# Patient Record
Sex: Female | Born: 1985 | Race: Black or African American | Hispanic: Yes | Smoking: Current every day smoker
Health system: Southern US, Community
[De-identification: ages and names within clinical notes are randomized; demographics above are authoritative.]

## PROBLEM LIST (undated history)

## (undated) DIAGNOSIS — T8859XA Other complications of anesthesia, initial encounter: Secondary | ICD-10-CM

## (undated) DIAGNOSIS — I10 Essential (primary) hypertension: Secondary | ICD-10-CM

## (undated) DIAGNOSIS — G8929 Other chronic pain: Secondary | ICD-10-CM

## (undated) DIAGNOSIS — Z8739 Personal history of other diseases of the musculoskeletal system and connective tissue: Secondary | ICD-10-CM

## (undated) DIAGNOSIS — Z95810 Presence of automatic (implantable) cardiac defibrillator: Secondary | ICD-10-CM

## (undated) DIAGNOSIS — J189 Pneumonia, unspecified organism: Secondary | ICD-10-CM

## (undated) DIAGNOSIS — T4145XA Adverse effect of unspecified anesthetic, initial encounter: Secondary | ICD-10-CM

## (undated) DIAGNOSIS — M545 Low back pain: Secondary | ICD-10-CM

## (undated) DIAGNOSIS — E119 Type 2 diabetes mellitus without complications: Secondary | ICD-10-CM

## (undated) DIAGNOSIS — I509 Heart failure, unspecified: Secondary | ICD-10-CM

---
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
## 2003-07-21 HISTORY — PX: CARDIAC CATHETERIZATION: SHX172

## 2003-11-18 DIAGNOSIS — M545 Low back pain, unspecified: Secondary | ICD-10-CM

## 2003-11-18 DIAGNOSIS — G8929 Other chronic pain: Secondary | ICD-10-CM

## 2003-11-18 HISTORY — DX: Other chronic pain: G89.29

## 2003-11-18 HISTORY — DX: Low back pain, unspecified: M54.50

## 2004-02-25 NOTE — ED Provider Notes (Signed)
[facility]                      EMERGENCY DEPARTMENT TREATMENT REPORT   NAME:  [name], [name]                  PT. LOCATION:     ER  [id number]   MR #:         BILLING #: [id number]          DOS: 02/25/2004   TIME: 7:24 A   38-32-34   cc:    Jaye Beagle, M.D.   Primary Physician:   Dr. Guy Franco.   CHIEF COMPLAINT:  Chest pain and cough.   HISTORY OF PRESENT ILLNESS:  This 18 year old, African-American female   presents at [phone] complaining of intermittent, left greater than right,   peristernal chest pain, worse with movement and deep inspiration,   associated with cough productive of green sputum.  The patient has had   these pains several times in the past, never saw a doctor for it, and has   taken some Tylenol without much change.  She denies shortness of breath,   exertional pain, dizziness, syncope, calf symptoms, hemoptysis, and   abdominal pain.  The pain is sharp and brief and rated as 9/10 when   present.   REVIEW OF SYSTEMS: CONSTITUTIONAL:  No fever, chills, weight loss.   ENT:  No sore throat or coryza.   HEMATOLOGIC/LYMPHATIC:  No excessive bruising or lymph node swelling.   RESPIRATORY:  No shortness of breath or wheezing.   CARDIOVASCULAR:  No chest pressure or palpitations.   GASTROINTESTINAL:  No vomiting, diarrhea, or abdominal pain. GENITOURINARY:   No dysuria, frequency, or urgency.  Denies chance of pregnancy.   MUSCULOSKELETAL:  No joint pain or swelling.   INTEGUMENTARY:  No rashes.   PAST MEDICAL HISTORY:  Ovarian cyst.  Negative MI, diabetes, hypertension.   FAMILY HISTORY:  Negative early MI.   SOCIAL HISTORY:  No cigarettes.   ALLERGIES:  None.   MEDICATIONS:  None.   PHYSICAL EXAMINATION:   VITAL SIGNS:  Blood pressure 143/81, pulse 88, respirations 16, temperature   99.3.  O2 saturation is 99% on room air.   GENERAL APPEARANCE:  The patient appears well developed and well nourished.    Appearance and behavior are age and situation appropriate.  The patient   appears in no distress.  Mouth/Throat:  Surfaces of the pharynx, palate,   and tongue are pink, moist, and without lesions.   NECK:  Supple, nontender, symmetrical, no masses or JVD, trachea midline,   thyroid not enlarged, nodular, or tender.   RESPIRATORY:  Clear and equal breath sounds.  No respiratory distress,   tachypnea, or accessory muscle use.   CARDIOVASCULAR:  Heart regular, without murmurs, gallops, rubs, or thrills.   PMI not displaced.   Vascular:   Calves soft and nontender.  No peripheral edema or significant   varicosities. Carotid, femoral, and pedal pulses are satisfactory.  The   abdominal aorta is not palpably enlarged.   CHEST:  Marked tenderness bilateral costochondral junctions, left greater   than right, which reproduces the patient's pain.  No rash or erythema.  No   swelling or crepitus.  GI:  Abdomen soft, nontender, without complaint of   pain to palpation.  No hepatomegaly or splenomegaly.  No abdominal or   inguinal masses appreciated by inspection or palpation.   SKIN:  Warm and dry  without rashes.   PSYCHIATRIC:  Judgment appears appropriate. Recent and remote memory appear   to be intact.  Oriented to time, place and person.  Mood and affect   appropriate.   INITIAL ASSESSMENT AND MANAGEMENT PLAN:  I suspect the patient has   costochondritis, but with her productive cough, we will need to do a chest   x-ray.   CONTINUATION BY DR. MANOLIO:   DIAGNOSTIC EVALUATION:  Chest x-ray was read negative acute by myself.   COURSE IN THE EMERGENCY DEPARTMENT:  The patient is discharged on Naprosyn   and is instructed to get Robitussin-DM over the counter for her cough and   congestion.   FINAL DIAGNOSES:      1. Acute precordial pain - costochondritis.      2. Acute bronchitis.   DISPOSITION:  The patient is discharged home in stable condition, with    instructions to follow up with their regular doctor.  They are advised to   return immediately for any worsening or symptoms of concern.   Electronically Signed By:   Imogene Burn, M.D. 03/02/2004 19:31   ____________________________   Imogene Burn, M.D.   mw/sp  D:  02/25/2004  T:  02/26/2004  7:59 A   [id number]

## 2004-07-28 ENCOUNTER — Emergency Department (HOSPITAL_COMMUNITY): Admission: EM | Admit: 2004-07-28 | Discharge: 2004-07-28 | Payer: Self-pay | Admitting: Emergency Medicine

## 2004-07-29 ENCOUNTER — Emergency Department (HOSPITAL_COMMUNITY): Admission: EM | Admit: 2004-07-29 | Discharge: 2004-07-29 | Payer: Self-pay

## 2004-08-01 ENCOUNTER — Inpatient Hospital Stay (HOSPITAL_COMMUNITY): Admission: EM | Admit: 2004-08-01 | Discharge: 2004-08-02 | Payer: Self-pay | Admitting: Emergency Medicine

## 2004-08-03 NOTE — ED Provider Notes (Signed)
[facility]                      EMERGENCY DEPARTMENT TREATMENT REPORT   NAME:  [name], KOTARA                    PT. LOCATION:     ER  [id number]   MR #:         BILLING #: [id number]          DOS: 08/03/2004   TIME: 1:25 A   38-32-34   cc:    [name], M.D.   Primary Physician:   Time seen:  0100.   CHIEF COMPLAINT:  Chest pain.   HISTORY OF PRESENT ILLNESS:  [name] who arrived to the   emergency department complaining of chest pain.  She states that it started   approximately a week ago, has noticed some blood tinge when she coughs up.   No fever, no chills, no nausea, no vomiting.  Was at [facility]   multiple times over to the hospital to be seen, the last time she was seen   she had a total workup done and cardiac profiles which were negative.  The   EKG showed a left interfascicular block, normal sinus rhythm with   occasional PVCs.  Chest x-ray showed cardiomegaly but no acute disease.   She had a D-dimer which as negative and a CT scan of the chest also which   was negative.  Her BTNP was less than 30, and her ABG was within normal   limits.  They sent her home with costochondritis discharge instructions and   to follow up with her family doctor but they did not give her any pain   medication and that is why she is here.   REVIEW OF SYSTEMS:   CONSTITUTIONAL:  No fever, chills, weight loss.   EYES: No visual symptoms.   ENT: No sore throat, runny nose or other URI symptoms.   ENDOCRINE:  No diabetic symptoms.   HEMATOLOGIC/LYMPHATIC:  No excessive bruising or lymph node swelling.   ALLERGIC/IMMUNOLOGIC:  No urticaria or allergy symptoms.   RESPIRATORY:  No cough, shortness of breath, or wheezing.   CARDIOVASCULAR:  Complaining of chest pain.   GASTROINTESTINAL:  No vomiting, diarrhea, or abdominal pain.   GENITOURINARY:  No dysuria, frequency, or urgency.   MUSCULOSKELETAL:  No joint pain or swelling.   INTEGUMENTARY:  No rashes.    NEUROLOGICAL:  No headaches, sensory or motor symptoms.   PAST MEDICAL HISTORY:  Significant for polycystic ovarian disease,   irregular menses, ________, as well as hyperglycemia documented in the   emergency room at [facility].   ALLERGIES:  Lasix which induces pruritus.   PAST SURGICAL HISTORY:  Negative.   MEDICATIONS:  Prilosec, Tylenol, albuterol.   PHYSICAL EXAMINATION:   VITAL SIGNS:  Blood pressure 119/68, pulse 64, respirations 20, temperature   98.0, O2 saturation 100%.   GENERAL APPEARANCE:  The patient appears well developed and well nourished.   Appearance and behavior are age and situation appropriate.   HEENT:  Head is normocephalic and atraumatic.  Eyes:  Conjunctivae clear,   lids normal.  Pupils equal, symmetrical, and normally reactive.   NECK:  Supple, nontender, symmetrical, no masses or JVD, trachea midline,   thyroid not enlarged, nodular, or tender.   RESPIRATORY:  Clear and equal breath sounds.  No respiratory distress,   tachypnea, or accessory muscle use.   CARDIOVASCULAR:  Regular rate and rhythm without any rubs, murmurs,   gallops, or thrills.   GI:  Abdomen soft, nontender, without complaint of pain to palpation.  No   hepatomegaly or splenomegaly.   SKIN:  Warm and dry without rashes.   NEUROLOGIC:  Cranial nerves, deep tendon reflexes, strength, and light   touch sensation are unremarkable.   CHEST WALL:  Complaining of chest wall pain with palpation.  Has no   pleuritic pain.   COURSE IN THE EMERGENCY DEPARTMENT:  Two Vicodin.   FINAL DIAGNOSIS:  Acute precordial chest pain.   DISPOSITION:  The patient is discharged with verbal and written   instructions and a referral for ongoing care.  The patient is aware that   they may return at any time for new or worsening   symptoms.  Condition stable.  Disposition home.  The patient was given   Vicodin, was told to continuing using ibuprofen as well.   Electronically Signed By:   Haze Justin, M.D. 08/08/2004 02:01    ____________________________   Haze Justin, M.D.   cd  D:  08/03/2004  T:  08/04/2004  7:10 A   [id number]   Hilaria Ota, PA-C

## 2004-11-03 NOTE — ED Provider Notes (Signed)
[facility]                      EMERGENCY DEPARTMENT TREATMENT REPORT   NAME:  MCKYNZIE, LIWANAG                    PT. LOCATION:     ER  QC14   MR #:         BILLING #: [id number]          DOS: 11/03/2004   TIME: 2:57 P   38-32-34   cc:    [name], M.D.   Primary Physician:   Time 1530.   CHIEF COMPLAINT:  Sore throat x 1 week.   HISTORY OF PRESENT ILLNESS: This 19 year old female has had a sore throat   for the past week.  Hurts to swallow.  Seems to be getting worse instead of   better.  She has had no fever, no chills.  Has no known sick contacts.  She   is not having any chest pain, no shortness of breath.   REVIEW OF SYSTEMS:   CONSTITUTIONAL:  No fever, chills, weight loss.   ENT:  Positive sore throat.  She is not aware of any postnasal drainage.   RESPIRATORY:  No cough, shortness of breath, or wheezing.   CARDIOVASCULAR:  No chest pain, chest pressure, or palpitations.   MUSCULOSKELETAL:  Denies any leg pain or swelling.   INTEGUMENTARY:  No rashes.   Denies complaints in any other system.   PAST MEDICAL HISTORY:  Asthma.  States she has had CHF.   MEDICATIONS:  She is on Lasix, potassium, and albuterol.   ALLERGIES:  To latex.   SOCIAL HISTORY: Negative for tobacco, she has not traveled anywhere, denies   any chance of pregnancy.   PHYSICAL EXAMINATION:   GENERAL APPEARANCE:   Well developed female.   VITAL SIGNS:  Blood pressure 132/78, pulse 76, respirations  18,   temperature  98.2, O2 saturation 99% on room air.   HEENT:  Eyes:  Conjunctivae clear, lids normal.  Pupils equal, symmetrical,   and normally reactive.   Ears/Nose:  Hearing is grossly intact to voice.   Internal and external examinations of the ears and nose are unremarkable.   Mouth - mucous membranes pink and moist.  Tonsils a bit red.  Uvula is   midline.  There is no soft tissue swelling of the uvula soft palate.   NECK:  Supple, nontender, symmetrical, no masses or JVD, trachea midline,    thyroid not enlarged, nodular, or tender.  There is no stridor.  Seems to   be swallowing secretions well.   RESPIRATORY:  Clear and equal breath sounds.  No respiratory distress,   tachypnea, or accessory muscle use.   CARDIOVASCULAR:  Heart regular, without murmurs, gallops, rubs, or thrills.   PMI not displaced.   GI:  Abdomen soft, nontender, without complaint of pain to palpation.  No   hepatomegaly or splenomegaly.   EXTREMITIES:  Warm and dry.   CALVES:  Soft, nontender.   SKIN:  Without rash.   INITIAL ASSESSMENT AND MANAGEMENT PLAN:  This is a 19 year old female who   presents for evaluation of a persistent sore throat now for a week, getting   worse instead of better.  At this time we will start her on Amoxicillin.   Also prednisone for a couple of days.  Rest, push fluids, and given the   name of Dr.  [name] to follow up.  Certainly seek medical attention at any   time with worsening or new concerns.   FINAL DIAGNOSIS:  Evaluation acute pharyngitis.   DISPOSITION:  The patient  was discharged home in stable condition to   follow up as above.  The patient was evaluated by myself and Dr. Stormy Card, who agrees with above assessment and plan.   Electronically Signed By:   Stormy Card, M.D. 11/07/2004 21:02   ____________________________   Stormy Card, M.D.   sl  D:  11/03/2004  T:  11/04/2004  9:44 A   [id number]   Fara Chute, PA

## 2005-03-27 NOTE — ED Provider Notes (Signed)
[facility]                      EMERGENCY DEPARTMENT TREATMENT REPORT   NAME:  [name]                PT. LOCATION:     ER  [id number]   MR #:         BILLING #: [id number]          DOS: 03/27/2005   TIME:12:16 P   38-32-34   cc:    Jaye Beagle, M.D.   Primary Physician:   TIME SEEN:  1201 hours   CHIEF COMPLIANT:  Shortness of breath, lightheaded, coughing.   HISTORY OF PRESENT ILLNESS:  The patient is a 19 year old female who for   the last 2 days has had a nonproductive cough.  No fever, chills, or chest   pain.  No tiredness.  No nausea or vomiting.  She has had orthopnea.   ___________ she has had numerous times in the past.  She had had difficulty   breathing, this is described as shortness of breath that she has described   as being worse in the last 2 days.  She has been taking her albuterol   inhaler 8-9 times the last 24-48 hours.  She is concerned.  She presents.   REVIEW OF SYSTEMS:   CONSTITUTIONAL:  No fever, chills, weight loss.   ENT: No sore throat, runny nose or other URI symptoms.   ENDOCRINE:  No diabetic symptoms.   RESPIRATORY:  As above.   CARDIOVASCULAR:  No chest pain, chest pressure, or palpitations.   GASTROINTESTINAL:  No vomiting, diarrhea, or abdominal pain.   GENITOURINARY:  No dysuria, frequency, or urgency.   NEUROLOGICAL:  No headaches, sensory or motor symptoms.   All other systems negative.   Denies complaints in any other system.   PAST MEDICAL HISTORY:  Significant for polycystic ovarian syndrome,   obesity, hypertension, and questionable CHF.  Last menstrual cycle was   irregular.  She cannot tell me actually last time.   SOCIAL HISTORY:  She is married, positive sick contact.   ALLERGIES:  Latex.   MEDICATIONS:  Lasix, potassium, and albuterol.   PHYSICAL EXAMINATION:   VITAL SIGNS:  Blood pressure 120/70, pulse 69, respiration 20, temperature   98.4, O2 saturation 90%, pain 0/10.    GENERAL:  The patient is an obese 19 year old black female, alone, no   complications, no complaints.   RESPIRATORY: Only faint wheezes heard on exploration to bilateral bases.   CARDIOVASCULAR:  Regular rate and rhythm. ______________ is 72.   EXTREMITIES: There is edema, non-pitting bilaterally, pretibial; otherwise,   radial and femoral pulses palpable +2.   SKIN:  Warm and dry without rashes.   CHEST:  Chest symmetrical without masses or tenderness.   GASTROINTESTINAL:  Obese, round, soft, nontender, normal active bowel   sounds.   MUSCULOSKELETAL:  Stance and gait appear normal.   NEUROLOGIC:  Cranial nerves, deep tendon reflexes, strength, and light   touch sensation are unremarkable.   PSYCHIATRIC:  Judgment appears appropriate.   CONTINUATION BY:  JOHN LENNON P.A.-C.   IMPRESSION:  This is a new problem for this patient.  A 19 year old female   with an acute respiratory illness and bronchospasm.  Objectively she has no   acute finding other than some wheeze with forced expiration.   PLAN:  She will be medicated with Combivent inhaler q.20 x  3 until   improvement.  PA and lateral chest x-ray.  Also an i-STAT and a urine   pregnancy for her dizziness and this irregular menstrual cycle secondary to   polycystic ovarian syndrome.   ANCILLARY STUDIES:  The i-STAT was unremarkable.  It noted glucose of 107.   H&amp;H stable at 13.3 and 39.1 respectively.  Sodium and potassium were 139   and 3.8.  Urine pregnancy HCG was negative.   Chest x-ray was interpreted as negative for any acute process by   radiology.   COURSE IN THE EMERGENCY DEPARTMENT:  The patient had great improvement   subjectively of her discomfort with a total of 2 breathing treatments.  Her   3rd breathing treatment, she was feeling completely improved and ready to   be discharged.   CLINICAL IMPRESSION/DIAGNOSES:  Acute bronchospasm.   DISPOSITION:  Stable to home and plan as above.  The patient was discharged    on prednisone 20 mg 3 tabs p.o. every day x 4.  She will follow up with Dr.   Rayburn Ma.  Work note for the next 48 hours.  Disposition as above.   Dr. Haze Justin agrees and concurs.   The patient is discharged home in stable condition, with instructions to   follow up with their regular doctor.  They are advised to return   immediately for any worsening or symptoms of concern.   Electronically Signed By:   Haze Justin, M.D. 03/28/2005 18:58   ____________________________   Haze Justin, M.D.   tw  D:  03/27/2005  T:  03/27/2005  3:01 P   [id number]   st  D:  03/27/2005  T:  03/28/2005 12:39 A   [id number]   Beverly Sessions, PA-C

## 2005-05-18 ENCOUNTER — Emergency Department (HOSPITAL_COMMUNITY): Admission: EM | Admit: 2005-05-18 | Discharge: 2005-05-18 | Payer: Self-pay | Admitting: Emergency Medicine

## 2005-05-27 ENCOUNTER — Emergency Department (HOSPITAL_COMMUNITY): Admission: EM | Admit: 2005-05-27 | Discharge: 2005-05-28 | Payer: Self-pay | Admitting: Emergency Medicine

## 2005-05-28 ENCOUNTER — Emergency Department (HOSPITAL_COMMUNITY): Admission: EM | Admit: 2005-05-28 | Discharge: 2005-05-28 | Payer: Self-pay | Admitting: Emergency Medicine

## 2005-07-07 ENCOUNTER — Emergency Department (HOSPITAL_COMMUNITY): Admission: EM | Admit: 2005-07-07 | Discharge: 2005-07-08 | Payer: Self-pay | Admitting: Emergency Medicine

## 2005-08-05 ENCOUNTER — Emergency Department (HOSPITAL_COMMUNITY): Admission: EM | Admit: 2005-08-05 | Discharge: 2005-08-05 | Payer: Self-pay | Admitting: Emergency Medicine

## 2005-10-12 ENCOUNTER — Emergency Department (HOSPITAL_COMMUNITY): Admission: EM | Admit: 2005-10-12 | Discharge: 2005-10-13 | Payer: Self-pay | Admitting: Emergency Medicine

## 2006-02-11 ENCOUNTER — Emergency Department (HOSPITAL_COMMUNITY): Admission: EM | Admit: 2006-02-11 | Discharge: 2006-02-11 | Payer: Self-pay | Admitting: Family Medicine

## 2006-02-15 ENCOUNTER — Emergency Department (HOSPITAL_COMMUNITY): Admission: EM | Admit: 2006-02-15 | Discharge: 2006-02-15 | Payer: Self-pay | Admitting: Emergency Medicine

## 2007-06-20 DIAGNOSIS — J189 Pneumonia, unspecified organism: Secondary | ICD-10-CM

## 2007-06-20 HISTORY — DX: Pneumonia, unspecified organism: J18.9

## 2007-07-21 HISTORY — PX: CARDIAC DEFIBRILLATOR PLACEMENT: SHX171

## 2007-10-06 NOTE — ED Provider Notes (Signed)
[facility]                      EMERGENCY DEPARTMENT TREATMENT REPORT   NAME:  [name]     PT. LOCATION:      [id number]          DOB:                                                                         AGE:   MR #:      BILLING #:           DOA:  10/06/2007   DOD:              SEX:  F   38-32-34   [id number]   cc:   Primary Physician:   CHIEF COMPLAINT:  Possible ingrown toe nail.   HISTORY OF PRESENT ILLNESS:  The patient is complaining of left great toe   red and painful.  She said that she tried to clip the nail and said that   things have been worse since then.  She denies any drainage.  No fever.   REVIEW OF SYSTEMS:   CONSTITUTIONAL:  No fever, chills, weight loss.   MUSCULOSKELETAL:  No joint pain or swelling.   SKIN:  As noted above.   NEUROLOGICAL:  No headaches, sensory or motor symptoms.   MEDICATIONS:  Hydrochlorothiazide, lisinopril, Coreg, aspirin.   ALLERGIES:  Aldactone.   PHYSICAL EXAMINATION:   VITAL SIGNS:  Blood pressure 157/103, pulse 85, respirations 18,   temperature 98.8, pulse oximetry is 99% on room air and pain is 4/10.   GENERAL APPEARANCE:  The patient appears well developed and well nourished.   Appearance and behavior are age and situation appropriate.  This is a   well-appearing 22 year old.   MUSCULOSKELETAL:  No bony tenderness to the left great toe.  Good capillary   refill.   SKIN:  The patient has an area to the lateral side of the left great toe   nail that is swollen and erythematous.  No pustular drainage is noted.  The   toe nail has been clipped very, very close and has curved into the   perionychium.   INITIAL ASSESSMENT AND MANAGEMENT PLAN:  This is a 22 year old with a   ingrown toe nail.  We will start her on Keflex.   FINAL DIAGNOSIS:  Ingrown toe nail.   DISPOSITION AND PLAN:  The patient was discharged home.  She is given a   postop shoe prior to discharge.  Told to follow up with podiatry.  She is    given a _____ and told to return here if any increased swelling, redness or   new concerns.   The patient was given Keflex.   Electronically Signed By:   Marijo Sanes, M.D. 10/11/2007 10:29   ____________________________   Marijo Sanes, M.D.   My signature above authenticates this document and my orders, the final   diagnosis(es), discharge prescription(s) and instructions in the [facility] record.   ML  D:  10/06/2007  T:  10/07/2007 11:30 A   [id number]

## 2008-03-08 NOTE — ED Provider Notes (Signed)
[facility]                      EMERGENCY DEPARTMENT TREATMENT REPORT   NAME:  [name], [name]      PT. LOCATION:    ER  ER54       DOB:  [birth year]                                                                     AGE:  22   MR #:       BILLING #:           DOS: 03/08/2008  TIME: 6:38 P   SEX:  F   38-32-34    [id number]   cc:    Jaye Beagle, M.D.   Primary Physician:   Primary Physician:   Dr. Guy Franco   CHIEF COMPLAINT   Bright red blood in stool.   HISTORY OF PRESENT ILLNESS   This is a 22 year old female who comes in with a 1 and a half week history   of blood noted in her stool.  The blood was dark at first and has been   turning brighter and is always mixed in with her stool.  She does not note   blood when she wipes.  She does not have any abnormal vaginal bleeding.   Has not noticed any change in the toilet water when defecating.  She also   has no pain with defecation.  She has had no recent abdominal pain.  She   has had some nausea without vomiting.  She has no recent fevers, sweats,   chills, no recent weight loss and no prior abdominal surgery.   REVIEW OF SYSTEMS   CONSTITUTIONAL:  No fever, chills, weight loss.   GASTROINTESTINAL:  Positive nausea without vomiting or diarrhea.  She does   have blood in her stools.  No complaint of abdominal pain.   GENITOURINARY:  No dysuria, frequency, or urgency.   MUSCULOSKELETAL:  No joint pain or swelling.   NEUROLOGICAL:  No headaches, sensory or motor symptoms.   PAST MEDICAL HISTORY   Congestive heart failure with defibrillator implanted.  Also has   diet-controlled diabetes mellitus and morbid obesity.   MEDICATIONS   Reviewed in ibex   ALLERGIES   Aldactone and latex.   SOCIAL HISTORY   She is a nonsmoker.   PHYSICAL EXAMINATION   VITAL SIGNS:  Blood pressure 139/90, pulse 100, respirations 20,   temperature 98.2, pain she denies, O2 saturation not assessed at this   time.    GENERAL APPEARANCE:  This is a well-developed, well-nourished morbidly   obese 22 year old female who is nontoxic.   RESPIRATORY:  Clear and equal breath sounds.  No respiratory distress,   tachypnea, or accessory muscle use.   CARDIOVASCULAR:  Heart regular, without murmurs, gallops, rubs, or thrills.   PMI not displaced.   GASTROINTESTINAL:  Morbidly obese in the trunk, but abdomen is soft and   nontender.  There is no guarding or rebound.  No hepatomegaly or   splenomegaly.   GASTROINTESTINAL:  No abdominal or inguinal masses appreciated by   inspection or palpation.  Rectal:   There are no masses or external  hemorrhoids noted.  The sphincter tone is normal.  I do not notice any anal   fissures or other abnormalities with the outside of the rectum.  This stool   is brown, it is guaiac positive.   INTEGUMENTARY:  No rashes.   SKIN:  Warm and dry without rashes.   NEUROLOGIC:  Alert, oriented.  Sensation intact, motor strength equal and   symmetric.   INITIAL ASSESSMENT AND MANAGEMENT PLAN   This is a very pleasant 22 year old female who comes in with blood noted in   her stool.  Her rectal exam is normal except that she does have a positive   guaiac, after evaluation by myself and Dr. Delton See we agreed that this   patient will need a bedside H&amp;H, if that is normal we will send her home to   follow up with her primary care physician for further evaluation.   DIAGNOSTIC STUDIES   Hemoglobin is 33.3, hematocrit is 39.   FINAL DIAGNOSIS   Hematochezia.   DISPOSITION   We will discharge the patient home today in a stable condition for her to   rest, drink plenty of fluids and eat a bland diet.  She is to follow up   with Dr. [name], her primary care doctor for further evaluation and return   here if her symptoms worsen.   Electronically Signed By:   Marijo Sanes, M.D. 03/17/2008 00:04   ____________________________   Marijo Sanes, M.D.   Dictated by:  Vic Ripper, PA    My signature above authenticates this document and my orders, the final   diagnosis(es), discharge prescription(s) and instructions in the [facility] record.   ML  D:  03/08/2008  T:  03/09/2008  5:59 P   [id number]/

## 2009-02-22 LAB — URINALYSIS W/ RFLX MICROSCOPIC
Bilirubin: NEGATIVE
Glucose: NEGATIVE MG/DL
Leukocyte Esterase: NEGATIVE
Nitrites: NEGATIVE
Specific gravity: >1.03 — ABNORMAL HIGH (ref 1.003–1.030)
Urobilinogen: 1 EU/DL (ref 0.2–1.0)
pH (UA): 6 (ref 5.0–8.0)

## 2009-02-22 LAB — URINE MICROSCOPIC ONLY
Bacteria: NEGATIVE /HPF
RBC: NEGATIVE /HPF (ref 0–5)
WBC: 4 /HPF (ref 0–4)

## 2009-02-22 LAB — HCG URINE, QL: HCG urine, QL: NEGATIVE

## 2009-02-23 LAB — METABOLIC PANEL, COMPREHENSIVE
A-G Ratio: 1 (ref 0.8–1.7)
ALT (SGPT): 56 U/L (ref 30–65)
AST (SGOT): 22 U/L (ref 15–37)
Albumin: 4 g/dL (ref 3.4–5.0)
Alk. phosphatase: 72 U/L (ref 50–136)
Anion gap: 9 mmol/L (ref 5–15)
BUN/Creatinine ratio: 7 — ABNORMAL LOW (ref 12–20)
BUN: 5 MG/DL — ABNORMAL LOW (ref 7–18)
Bilirubin, total: 0.3 MG/DL (ref 0.1–0.9)
CO2: 25 MMOL/L (ref 21–32)
Calcium: 9.2 MG/DL (ref 8.4–10.4)
Chloride: 103 MMOL/L (ref 100–108)
Creatinine: 0.7 MG/DL (ref 0.6–1.3)
GFR est AA: >60 mL/min/{1.73_m2} (ref >60)
GFR est non-AA: >60 mL/min/{1.73_m2} (ref >60)
Globulin: 4.2 g/dL — ABNORMAL HIGH (ref 2.0–4.0)
Glucose: 109 MG/DL — ABNORMAL HIGH (ref 74–99)
Potassium: 3.5 MMOL/L (ref 3.5–5.5)
Protein, total: 8.2 g/dL (ref 6.4–8.2)
Sodium: 137 MMOL/L (ref 136–145)

## 2009-02-23 LAB — CBC WITH AUTOMATED DIFF
ABS. EOSINOPHILS: 0.1 10*3/uL (ref 0.0–0.4)
ABS. LYMPHOCYTES: 2 10*3/uL (ref 0.8–3.5)
ABS. MONOCYTES: 0.7 10*3/uL (ref 0–1.0)
ABS. NEUTROPHILS: 6.8 10*3/uL (ref 1.8–8.0)
BASOPHILS: 1 % (ref 0–3)
EOSINOPHILS: 1 % (ref 0–5)
HCT: 39.7 % (ref 36.0–46.0)
HGB: 13.4 g/dL (ref 12.0–16.0)
LYMPHOCYTES: 20 % (ref 20–51)
MCH: 29.2 PG (ref 25.0–35.0)
MCHC: 33.8 g/dL (ref 31.0–37.0)
MCV: 86.3 FL (ref 78.0–102.0)
MONOCYTES: 8 % (ref 2–9)
MPV: 8.1 FL (ref 7.4–10.4)
NEUTROPHILS: 70 % (ref 42–75)
PLATELET: 261 10*3/uL (ref 130–400)
RBC: 4.6 M/uL (ref 4.10–5.10)
RDW: 13.7 % (ref 11.5–14.5)
WBC: 9.7 10*3/uL (ref 4.5–13.0)

## 2009-02-23 LAB — HCG QL SERUM: HCG, Ql.: NEGATIVE

## 2009-02-26 LAB — URINALYSIS W/ RFLX MICROSCOPIC
Bilirubin: NEGATIVE
Blood: NEGATIVE
Glucose: NEGATIVE MG/DL
Ketone: NEGATIVE MG/DL
Nitrites: NEGATIVE
Protein: NEGATIVE MG/DL
Specific gravity: 1.025 (ref 1.003–1.030)
Urobilinogen: 1 EU/DL (ref 0.2–1.0)
pH (UA): 7 (ref 5.0–8.0)

## 2009-02-26 LAB — URINE MICROSCOPIC ONLY
RBC: 0 /HPF (ref 0–5)
WBC: 1 /HPF (ref 0–4)

## 2009-02-26 LAB — HEPATIC FUNCTION PANEL
A-G Ratio: 1 (ref 0.8–1.7)
ALT (SGPT): 61 U/L (ref 30–65)
AST (SGOT): 25 U/L (ref 15–37)
Albumin: 3.8 g/dL (ref 3.4–5.0)
Alk. phosphatase: 66 U/L (ref 50–136)
Bilirubin, direct: 0.1 MG/DL (ref 0.0–0.3)
Bilirubin, total: 0.4 MG/DL (ref 0.1–0.9)
Globulin: 4 g/dL (ref 2.0–4.0)
Protein, total: 7.8 g/dL (ref 6.4–8.2)

## 2009-02-26 LAB — METABOLIC PANEL, BASIC
Anion gap: 5 mmol/L (ref 5–15)
BUN/Creatinine ratio: 7 — ABNORMAL LOW (ref 12–20)
BUN: 5 MG/DL — ABNORMAL LOW (ref 7–18)
CO2: 29 MMOL/L (ref 21–32)
Calcium: 9.8 MG/DL (ref 8.4–10.4)
Chloride: 105 MMOL/L (ref 100–108)
Creatinine: 0.7 MG/DL (ref 0.6–1.3)
GFR est AA: >60 mL/min/{1.73_m2} (ref >60)
GFR est non-AA: >60 mL/min/{1.73_m2} (ref >60)
Glucose: 136 MG/DL — ABNORMAL HIGH (ref 74–99)
Potassium: 3.6 MMOL/L (ref 3.5–5.5)
Sodium: 139 MMOL/L (ref 136–145)

## 2009-02-26 LAB — LIPASE: Lipase: 145 U/L (ref 73–393)

## 2009-02-26 LAB — CBC WITH AUTOMATED DIFF
ABS. EOSINOPHILS: 0.1 10*3/uL (ref 0.0–0.4)
ABS. LYMPHOCYTES: 1.7 10*3/uL (ref 0.8–3.5)
ABS. MONOCYTES: 0.8 10*3/uL (ref 0–1.0)
ABS. NEUTROPHILS: 5.7 10*3/uL (ref 1.8–8.0)
BASOPHILS: 0 % (ref 0–3)
EOSINOPHILS: 1 % (ref 0–5)
HCT: 37.6 % (ref 36.0–46.0)
HGB: 12.9 g/dL (ref 12.0–16.0)
LYMPHOCYTES: 21 % (ref 20–51)
MCH: 29.3 PG (ref 25.0–35.0)
MCHC: 34.4 g/dL (ref 31.0–37.0)
MCV: 85.2 FL (ref 78.0–102.0)
MONOCYTES: 10 % — ABNORMAL HIGH (ref 2–9)
MPV: 7.4 FL (ref 7.4–10.4)
NEUTROPHILS: 68 % (ref 42–75)
PLATELET: 265 10*3/uL (ref 130–400)
RBC: 4.42 M/uL (ref 4.10–5.10)
RDW: 13.8 % (ref 11.5–14.5)
WBC: 8.3 10*3/uL (ref 4.5–13.0)

## 2009-02-26 LAB — WET PREP

## 2009-02-26 LAB — AMYLASE: Amylase: 62 U/L (ref 25–115)

## 2009-02-26 LAB — HCG URINE, QL: HCG urine, QL: NEGATIVE

## 2009-02-28 LAB — CHLAMYDIA/GC PCR
Chlamydia amplified: POSITIVE — AB
N. gonorrhea, amplified: NEGATIVE

## 2009-02-28 LAB — CULTURE, URINE
Culture result:: >100000
Culture: >100000

## 2010-05-13 DIAGNOSIS — E119 Type 2 diabetes mellitus without complications: Secondary | ICD-10-CM | POA: Insufficient documentation

## 2010-06-16 NOTE — ED Provider Notes (Signed)
KNOWN ALLERGIES   aldactone   Latex       TRIAGE   TRIAGE NOTES:  last menstrual period 04/17/2010.   PATIENT: NAME: [name], AGE: 24, GENDER: female,         DOB: Mon 02-10-1986, TIME OF GREET: Mon Jun 16, 2010 16:19, SSN:         [id number], MEDICAL RECORD NUMBER: [id number], ACCOUNT NUMBER: [id number],         PCP: Jaye Beagle,.   ADMISSION: URGENCY: 4, DEPT: Emergency, BED: WAITING.   VITAL SIGNS: BP 161/112, (Sitting), Pulse 89, Resp 20, Temp 98.9,         (Oral), Pain 6, O2 Sat 98, on Room air, Time 06/16/2010 16:39.   COMPLAINT:  Abd Pain Poss Pregnant.   PRESENTING COMPLAINT:  Abd. pain + preg., Since Yesterday.   PAIN: Patient complains of pain, Pain described as aching, Pain         described as nagging, On a scale 0-10 patient rates pain as 6, Pain         is intermittent.   LMP: Last menstrual period: 04-17-2010, Estimated conception         05/01/2010, Estimated due date 01/22/2011, Estimated fetal age 54         weeks, 4 days, G: 1, P: 0, AB: 0, Pt not on birth control.   TB SCREENING: TB screen negative for this patient.   ABUSE SCREENING: Patient denies physical abuse or threats.   FALL RISK: Patient has a low risk of falling, Patient has no         history of falling (0), No secondary diagnosis (0), None/bed         rest/nurse assist (0), No IV or IV access (0), Normal/bed         rest/wheelchair (0), Oriented to own ability (0).   SUICIDAL IDEATION: Suicidal ideation is not present.   ADVANCE DIRECTIVES: Patient does not have advance directives,         Triage assessment performed.   PROVIDERS: TRIAGE NURSE: Annamary Carolin, RN.   PREVIOUS VISIT ALLERGIES: Aldactone, Latex.       CURRENT MEDICATIONS   Aspirin:  81 mg Oral once a day.   Inspra:  25 mg Oral once a day.   Coreg:  12.5 mg Oral 2 times a day.   Lisinopril:  5 mg Oral once a day.   Lasix:  20 mg Oral once a day.   HydrALAZINE Hydrochloride:  25 mg 2 times a day.       INSTRUCTION    DISCHARGE:  DYSMENORRHEA (DUB, MENSTRUAL PAIN, ABNORMAL VAGINAL         BLEEDING).   FOLLOWUPJaye Beagle, [facility]ILY [facility], [address], [place] [address], [phone], Kayren Eaves, [id number], [place] [address], [phone].   SPECIAL:  Follow up with ob/gyn.         Read and understand discharge instructions prior to leaving ER.         Follow these in regards to care and return for those reasons as         detailed.         Return to the ER if condition worsens or new symptoms develop.         Call ER back in 2 days for culture results [phone].  Key:     KLH1=Heath, RN, Clydie Braun  LDD0=David, RN, Lyn  NJB=Brosky, PA-C, Weston Brass

## 2010-06-16 NOTE — ED Provider Notes (Signed)
[facility]   EMERGENCY DEPARTMENT TREATMENT REPORT   NAME:  Rivenburg, Rayden   SEX:   F   ADMIT: 06/16/2010   DOB:   1985-11-27   MR#    [id number]   ROOM:     TIME SEEN: 07 13 PM   ACCT#  [id number]       cc: Guy Franco MD       PRIMARY CARE PHYSICIAN:   Guy Franco, MD        TIME OF EVALUATION:   1805       CHIEF COMPLAINT:    Pelvic pain and possible pregnancy.       HISTORY OF PRESENT ILLNESS:   A 24 year old female presenting with 2-day history of crampy bilateral lower    quadrant abdominal/pelvic pains.  She has noticed some vaginal spotting.  She    does state she had a positive home pregnancy test.  Unsure of blood type.     She has had no nausea, no vomiting, no diarrhea, otherwise normal bowel    movements.  No other acute complaints at this time.       REVIEW OF SYSTEMS:   CONSTITUTIONAL:  No fever.   RESPIRATORY:  No cough, shortness of breath, or wheezing.    CARDIOVASCULAR:  No chest pain, chest pressure, or palpitations.    GASTROINTESTINAL:   As above.  No melena, no hematochezia.   GENITOURINARY:  No dysuria, frequency, or urgency.  No vaginal discharge.   MUSCULOSKELETAL:  No joint pain or swelling.   INTEGUMENTARY:  No rashes.    NEUROLOGICAL:  No headache.       PAST MEDICAL HISTORY:   Diabetes, hypertension, CHF, pacemaker defibrillator implantation.       SOCIAL HISTORY:   No alcohol, tobacco or drug use.       FAMILY HISTORY:   Noncontributory.       ALLERGIES:   ALDACTONE, LATEX.       CURRENT MEDICATIONS:   Listed in [place].       PHYSICAL EXAMINATION:   GENERAL APPEARANCE:  Adequately nourished 24 year old female presenting for    exam.   VITAL SIGNS:  Blood 160/112, pulse 89, respirations 20, temperature 98.9, O2    sat 99% room air.   RESPIRATORY:  Clear and equal breath sounds.  No respiratory distress,    tachypnea, or accessory muscle use.      CARDIOVASCULAR:  Heart:  S1 and S2 appreciated.  Regular rate and rhythm    appreciated.    GASTROINTESTINAL:  Abdomen soft, nontender in all 4 quadrants.  Normoactive    bowel sounds in all 4 quadrants.   GENITOURINARY:  Performed with Kathlen Brunswick, RN at bedside.  External    examination unremarkable for acute process.  Internal examination eliciting    some scant blood in the vaginal vault.  The cervix itself was nontender.  Both    adnexa nontender with no palpable masses bilaterally.   MUSCULOSKELETAL:  No cva tenderness bilaterally.   SKIN:  Warm and dry, no acute findings.       CONTINUATION BY NICHOLAS BROSKY, PA:        INITIAL ASSESSMENT AND MANAGEMENT PLAN:    A 23 year old female presenting with bleeding in the vaginal vault.  We will    check pregnancy test to evaluate this as a possible threatened miscarriage,    also check urine dip for any sign or symptom of acute cystitis.  Wet prep and  STD cultures and evaluate the patient on receipt of these services.        DIAGNOSTIC STUDIES:   Urine pregnancy test negative.  Pregnancy quantitative urine was also    negative.  Urine dip moderate blood otherwise unremarkable.  Wet prep no yeast    or trichomonas and no clue cells.         REEVALUATION AND COURSE IN THE EMERGENCY DEPARTMENT:    The patient had no pain on pelvic exam.  At this point she does appear to be    beginning her cycle symptoms possibly consistent dysmenorrhea we will    discharge home with supportive care measures and follow up as an outpatient.     There is no marked discharge on exam. No sign or symptom to treat empirically    for cervicitis at this time.       CLINICAL IMPRESSION AND DIAGNOSIS:   Acute dysmenorrhea - pelvic pain.        DISPOSITION AND PLAN:   The patient was personally evaluated by myself and Dr. Lucita Ferrara who agrees    with the above assessment and plan.   Follow up with OB GYN, call the ER back    in 2 days for culture results.  Return to the emergency room if condition    worsens, new symptoms develop or for any concerns.            ___________________   Pennie Rushing MD   Dictated By: Baruch Goldmann, PA-C   tc   D:06/16/2010   T: 06/17/2010 14:20:37   [id number]

## 2011-12-21 DIAGNOSIS — I10 Essential (primary) hypertension: Secondary | ICD-10-CM | POA: Insufficient documentation

## 2012-09-12 LAB — CARDIAC PANEL,(CK, CKMB & TROPONIN)
CK - MB: 1.1 ng/ml (ref 0.5–3.6)
CK-MB Index: 1 % (ref 0.0–4.0)
CK: 106 U/L (ref 26–192)
Troponin-I, QT: <0.02 NG/ML (ref 0.0–0.045)

## 2012-09-12 LAB — CBC WITH AUTOMATED DIFF
ABS. BASOPHILS: 0 10*3/uL (ref 0.0–0.06)
ABS. EOSINOPHILS: 0.5 10*3/uL — ABNORMAL HIGH (ref 0.0–0.4)
ABS. LYMPHOCYTES: 2.9 10*3/uL (ref 0.9–3.6)
ABS. MONOCYTES: 0.8 10*3/uL (ref 0.05–1.2)
ABS. NEUTROPHILS: 3.5 10*3/uL (ref 1.8–8.0)
BASOPHILS: 0 % (ref 0–2)
EOSINOPHILS: 6 % — ABNORMAL HIGH (ref 0–5)
HCT: 38.9 % (ref 35.0–45.0)
HGB: 13.1 g/dL (ref 12.0–16.0)
LYMPHOCYTES: 38 % (ref 21–52)
MCH: 29 PG (ref 24.0–34.0)
MCHC: 33.7 g/dL (ref 31.0–37.0)
MCV: 86.3 FL (ref 74.0–97.0)
MONOCYTES: 10 % (ref 3–10)
MPV: 10.1 FL (ref 9.2–11.8)
NEUTROPHILS: 46 % (ref 40–73)
PLATELET: 216 10*3/uL (ref 135–420)
RBC: 4.51 M/uL (ref 4.20–5.30)
RDW: 13.5 % (ref 11.6–14.5)
WBC: 7.7 10*3/uL (ref 4.6–13.2)

## 2012-09-12 LAB — HEPATIC FUNCTION PANEL
A-G Ratio: 0.9 (ref 0.8–1.7)
ALT (SGPT): 47 U/L (ref 12.0–78.0)
AST (SGOT): 19 U/L (ref 15–37)
Albumin: 3.6 g/dL (ref 3.4–5.0)
Alk. phosphatase: 70 U/L (ref 50–136)
Bilirubin, direct: <0.1 MG/DL (ref 0.0–0.2)
Bilirubin, total: 0.4 MG/DL (ref 0.2–1.0)
Globulin: 4.1 g/dL — ABNORMAL HIGH (ref 2.0–4.0)
Protein, total: 7.7 g/dL (ref 6.4–8.2)

## 2012-09-12 LAB — URINALYSIS W/ RFLX MICROSCOPIC
Bilirubin: NEGATIVE
Blood: NEGATIVE
Glucose: >1000 MG/DL — AB
Ketone: NEGATIVE MG/DL
Leukocyte Esterase: NEGATIVE
Nitrites: NEGATIVE
Protein: NEGATIVE MG/DL
Specific gravity: 1.01 (ref 1.003–1.030)
Urobilinogen: 0.2 EU/DL (ref 0.2–1.0)
pH (UA): 5.5 (ref 5.0–8.0)

## 2012-09-12 LAB — METABOLIC PANEL, BASIC
Anion gap: 8 mmol/L (ref 3.0–18)
BUN/Creatinine ratio: 9 — ABNORMAL LOW (ref 12–20)
BUN: 6 MG/DL — ABNORMAL LOW (ref 7.0–18)
CO2: 25 MMOL/L (ref 21–32)
Calcium: 8.7 MG/DL (ref 8.5–10.1)
Chloride: 105 MMOL/L (ref 100–108)
Creatinine: 0.67 MG/DL (ref 0.6–1.3)
GFR est AA: >60 mL/min/{1.73_m2} (ref >60)
GFR est non-AA: >60 mL/min/{1.73_m2} (ref >60)
Glucose: 176 MG/DL — ABNORMAL HIGH (ref 74–99)
Potassium: 3.6 MMOL/L (ref 3.5–5.5)
Sodium: 138 MMOL/L (ref 136–145)

## 2012-09-12 LAB — HCG URINE, QL: HCG urine, QL: NEGATIVE

## 2012-09-12 LAB — NT-PRO BNP: NT pro-BNP: 513 PG/ML — ABNORMAL HIGH (ref 0–450)

## 2012-09-12 LAB — GLUCOSE, POC: Glucose (POC): 229 mg/dL — ABNORMAL HIGH (ref 70–110)

## 2012-09-12 LAB — LIPASE: Lipase: 143 U/L (ref 73–393)

## 2012-09-12 MED ADMIN — ketorolac (TORADOL) injection 30 mg: INTRAVENOUS | @ 20:00:00 | NDC 00409379501

## 2012-09-12 MED ADMIN — ondansetron (ZOFRAN) injection 4 mg: INTRAVENOUS | @ 20:00:00 | NDC 00781301072

## 2012-09-12 MED FILL — ONDANSETRON (PF) 4 MG/2 ML INJECTION: 4 mg/2 mL | INTRAMUSCULAR | Qty: 2

## 2012-09-12 MED FILL — KETOROLAC TROMETHAMINE 30 MG/ML INJECTION: 30 mg/mL (1 mL) | INTRAMUSCULAR | Qty: 1

## 2012-09-12 NOTE — ED Notes (Signed)
Blood sugar 229 mg/dl

## 2012-09-12 NOTE — ED Notes (Signed)
I have reviewed discharge instructions with the patient.  The patient verbalized understanding.

## 2012-09-12 NOTE — ED Notes (Signed)
Pt found having pulled all monitor equipement off.

## 2012-09-12 NOTE — Discharge Instructions (Signed)
Scheduled follow up PCP appointment with Dr. Amelia Jo on 09/15/12 @11 :15am

## 2012-09-12 NOTE — ED Notes (Signed)
3:49 PM  CXR cardiomegaly. EKG [1525]: NSR 87; LAD; LVH; occ PVC; QTc:507. No acute change

## 2012-09-12 NOTE — ED Notes (Signed)
Pt refusing to wear monitor equipement. Risks and benefits discussed. Education given. Pt still refuses. md updated that pt reports no effect of meds and want to know when she can go.

## 2012-09-12 NOTE — ED Provider Notes (Signed)
HPI Comments: States that she woke up at noon yesterday with an occipital headache that moved to the left temporo parietal area; throbbing; worse with movement and sounds; did not take anything for it. Last night, she developed dizziness and states that at ~ 2 am, she developed nausea and vomiting and later nonbloody diarrhea. +smoker; occ alcohol. Meds: asa; fish oil. States that she is supposed to be taking coreg; lasix; lisinopril; metformin; Impro? But has not been taking them due to lack of money. Followed by [facility]; last seen in Nov. '13. 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 ALLERGIC: aldactone; latex. G0P0; lnmp:11/ '13; no other surgery.       Patient is a 27 y.o. female presenting with headaches, nausea, dizziness, and diarrhea.   Headache   Associated symptoms include dizziness and nausea.   Nausea   Associated symptoms include diarrhea, headaches and headaches.   Dizziness  Associated symptoms include headaches and nausea.   Diarrhea   Associated symptoms include diarrhea, nausea and headaches.        Past Medical History   Diagnosis Date   ??? Obesity    ??? Heart failure    ??? Diabetes    ??? Hypertension         History reviewed. No pertinent past surgical history.      History reviewed. No pertinent family history.     History     Social History   ??? Marital Status: DIVORCED     Spouse Name: N/A     Number of Children: N/A   ??? Years of Education: N/A     Occupational History   ??? Not on file.     Social History Main Topics   ??? Smoking status: Light Tobacco Smoker -- 0.25 packs/day   ??? Smokeless tobacco: Not on file   ??? Alcohol Use: No   ??? Drug Use: No   ??? Sexually Active: Not on file     Other Topics Concern   ??? Not on file     Social History Narrative   ??? No narrative on file                  ALLERGIES: Latex and Aldactone      Review of Systems   Gastrointestinal: Positive for nausea and diarrhea.    Neurological: Positive for dizziness and headaches.   All other systems reviewed and are negative.        Filed Vitals:    09/12/12 1520 09/12/12 1521 09/12/12 1524 09/12/12 1525   BP: 116/69      Pulse:   83 81   Temp:       Resp:   22 19   Height:       Weight:       SpO2:  98% 100% 100%            Physical Exam   Nursing note and vitals reviewed.  Constitutional: She is oriented to person, place, and time. She appears well-developed and well-nourished. No distress.   Obese female; ambulatory; in NO distress   HENT:   Head: Normocephalic.   Mouth/Throat: No oropharyngeal exudate.   Eyes: Pupils are equal, round, and reactive to light. Right eye exhibits no discharge.   Neck: Normal range of motion. No JVD present. No thyromegaly present.   Cardiovascular: Normal rate and regular rhythm.  Exam reveals  no gallop and no friction rub.    No murmur heard.  Pulmonary/Chest: Effort normal and breath sounds normal. No respiratory distress. She has no wheezes. She has no rales. She exhibits no tenderness.   Abdominal: Soft. Bowel sounds are normal. She exhibits no distension. There is no tenderness. There is no rebound and no guarding.   Musculoskeletal: Normal range of motion.   Neurological: She is alert and oriented to person, place, and time.   Skin: Skin is warm and dry. No erythema.   Psychiatric: She has a normal mood and affect.        MDM    Procedures    Recent Results (from the past 12 hour(s))   GLUCOSE, POC    Collection Time     09/12/12  1:29 PM       Result Value Range    Glucose (POC) 229 (*) 70 - 110 mg/dL   URINALYSIS W/ RFLX MICROSCOPIC    Collection Time     09/12/12  3:00 PM       Result Value Range    Color YELLOW      Appearance CLEAR      Specific gravity 1.010  1.003 - 1.030      pH 5.5  5.0 - 8.0      Protein NEGATIVE   NEGATIVE MG/DL    Glucose >4696 (*) NEGATIVE MG/DL    Ketone NEGATIVE   NEGATIVE MG/DL    Bilirubin NEGATIVE   NEGATIVE    Blood NEGATIVE   NEGATIVE    Urobilinogen 0.2  0.2 -  1.0 EU/DL    Nitrites NEGATIVE   NEGATIVE    Leukocyte Esterase NEGATIVE   NEGATIVE   HCG URINE, QL    Collection Time     09/12/12  3:00 PM       Result Value Range    HCG urine, Ql. NEGATIVE   NEGATIVE   CBC WITH AUTOMATED DIFF    Collection Time     09/12/12  3:05 PM       Result Value Range    WBC 7.7  4.6 - 13.2 K/uL    RBC 4.51  4.20 - 5.30 M/uL    HGB 13.1  12.0 - 16.0 g/dL    HCT 29.5  28.4 - 13.2 %    MCV 86.3  74.0 - 97.0 FL    MCH 29.0  24.0 - 34.0 PG    MCHC 33.7  31.0 - 37.0 g/dL    RDW 44.0  10.2 - 72.5 %    PLATELET 216  135 - 420 K/uL    MPV 10.1  9.2 - 11.8 FL    NEUTROPHILS 46  40 - 73 %    LYMPHOCYTES 38  21 - 52 %    MONOCYTES 10  3 - 10 %    EOSINOPHILS 6 (*) 0 - 5 %    BASOPHILS 0  0 - 2 %    ABS. NEUTROPHILS 3.5  1.8 - 8.0 K/UL    ABS. LYMPHOCYTES 2.9  0.9 - 3.6 K/UL    ABS. MONOCYTES 0.8  0.05 - 1.2 K/UL    ABS. EOSINOPHILS 0.5 (*) 0.0 - 0.4 K/UL    ABS. BASOPHILS 0.0  0.0 - 0.06 K/UL    DF AUTOMATED     METABOLIC PANEL, BASIC    Collection Time     09/12/12  3:05 PM       Result Value Range    Sodium  138  136 - 145 MMOL/L    Potassium 3.6  3.5 - 5.5 MMOL/L    Chloride 105  100 - 108 MMOL/L    CO2 25  21 - 32 MMOL/L    Anion gap 8  3.0 - 18 mmol/L    Glucose 176 (*) 74 - 99 MG/DL    BUN 6 (*) 7.0 - 18 MG/DL    Creatinine 1.61  0.6 - 1.3 MG/DL    BUN/Creatinine ratio 9 (*) 12 - 20      GFR est AA >60  >60 ml/min/1.44m2    GFR est non-AA >60  >60 ml/min/1.56m2    Calcium 8.7  8.5 - 10.1 MG/DL   CARDIAC PANEL,(CK, CKMB & TROPONIN)    Collection Time     09/12/12  3:05 PM       Result Value Range    CK 106  26 - 192 U/L    CK - MB 1.1  0.5 - 3.6 ng/ml    CK-MB Index 1.0  0.0 - 4.0 %    Troponin-I, Qt. <0.02  0.0 - 0.045 NG/ML   PRO-BNP    Collection Time     09/12/12  3:05 PM       Result Value Range    NT pro-BNP 513 (*) 0 - 450 PG/ML   HEPATIC FUNCTION PANEL    Collection Time     09/12/12  3:05 PM       Result Value Range    Protein, total 7.7  6.4 - 8.2 g/dL    Albumin 3.6  3.4 - 5.0 g/dL     Globulin 4.1 (*) 2.0 - 4.0 g/dL    A-G Ratio 0.9  0.8 - 1.7      Bilirubin, total 0.4  0.2 - 1.0 MG/DL    Bilirubin, direct <0.9  0.0 - 0.2 MG/DL    Alk. phosphatase 70  50 - 136 U/L    AST 19  15 - 37 U/L    ALT 47  12.0 - 78.0 U/L   LIPASE    Collection Time     09/12/12  3:05 PM       Result Value Range    Lipase 143  73 - 393 U/L   EKG, 12 LEAD, INITIAL    Collection Time     09/12/12  3:25 PM       Result Value Range    Ventricular Rate 87      Atrial Rate 87      P-R Interval 182      QRS Duration 110      Q-T Interval 422      QTC Calculation (Bezet) 507      Calculated P Axis 64      Calculated R Axis -51      Calculated T Axis 65      Diagnosis        Value: Sinus rhythm with frequent premature ventricular complexes      Left anterior fascicular block      Voltage criteria for left ventricular hypertrophy      Prolonged QT      Abnormal ECG      No previous ECGs available     CT AND CXR NEGATIVE.    5:34 PM  Labs discussed; pt given appt with pc doctor for Thursday. Will give naprosyn for headache. Wants work note for today. Advised return if any worsening. No vomiting while in ER. Feeling better.  CLINICAL IMPRESSION    1. Headache    2. Non-compliant patient

## 2012-09-13 LAB — EKG, 12 LEAD, INITIAL
Atrial Rate: 87 {beats}/min
Calculated P Axis: 64 degrees
Calculated R Axis: -51 degrees
Calculated T Axis: 65 degrees
P-R Interval: 182 ms
Q-T Interval: 422 ms
QRS Duration: 110 ms
QTC Calculation (Bezet): 507 ms
Ventricular Rate: 87 {beats}/min

## 2013-06-17 DIAGNOSIS — I420 Dilated cardiomyopathy: Secondary | ICD-10-CM | POA: Insufficient documentation

## 2013-06-17 DIAGNOSIS — Z95810 Presence of automatic (implantable) cardiac defibrillator: Secondary | ICD-10-CM | POA: Insufficient documentation

## 2013-08-22 NOTE — ED Notes (Signed)
It was noted in triage that's pts HR was in the low 40s.  She sts this is the norm for her and she is not having any chest pain or dizziness.  She sts she has a pacemaker AND a defib.

## 2013-08-22 NOTE — ED Notes (Signed)
Pt sts she has left wrist pain and noticed a lump pop up today.  Sts that it is hard to flex it.  Denies any injury.

## 2013-08-23 LAB — EKG, 12 LEAD, INITIAL
Atrial Rate: 86 {beats}/min
Calculated P Axis: 64 degrees
Calculated R Axis: -42 degrees
Calculated T Axis: 74 degrees
P-R Interval: 184 ms
Q-T Interval: 444 ms
QRS Duration: 116 ms
QTC Calculation (Bezet): 531 ms
Ventricular Rate: 86 {beats}/min

## 2013-08-23 MED ORDER — OXYCODONE-ACETAMINOPHEN 5 MG-325 MG TAB
5-325 mg | ORAL | Status: AC
Start: 2013-08-23 — End: 2013-08-23
  Administered 2013-08-23: 06:00:00 via ORAL

## 2013-08-23 MED FILL — OXYCODONE-ACETAMINOPHEN 5 MG-325 MG TAB: 5-325 mg | ORAL | Qty: 1

## 2013-08-23 NOTE — ED Provider Notes (Signed)
I was personally available for consultation in the emergency department.  I have not seen the patient but have reviewed the chart prior to discharge and agree with the documentation recorded by the MLP, including the assessment, treatment plan, and disposition.  Vivienne Sangiovanni J Bear Osten, MD

## 2013-08-23 NOTE — ED Notes (Signed)
Pt verbalized understanding of dc instructions. Fully clothed, ambulated out of ed with steady gait.

## 2013-08-23 NOTE — ED Provider Notes (Signed)
HPI Comments: [name] is a left hand dominant 28 y.o. 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 Denies fever, chills, cp, palps, doe, orthopnea, leg swelling, pnd, joint swelling/redness/warmth/bruising, loss of sensation/numbness/tingling.     PSX: LHC 11/2004 w/ normal coronaries.                 Patient is a 28 y.o. female presenting with wrist pain.   Wrist Pain          Past Medical History   Diagnosis Date   ??? Obesity    ??? Hypertension    ??? Heart failure      EF 15% (06/2013)   ??? Diabetes      NIDDM        Past Surgical History   Procedure Laterality Date   ??? Hx pacemaker       2009 (AICD/PACER) [employer]         History reviewed. No pertinent family history.     History     Social History   ??? Marital Status: DIVORCED     Spouse Name: N/A     Number of Children: N/A   ??? Years of Education: N/A     Occupational History   ??? Not on file.     Social History Main Topics   ??? Smoking status: Light Tobacco Smoker -- 0.25 packs/day   ??? Smokeless tobacco: Not on file   ??? Alcohol Use: No   ??? Drug Use: No   ??? Sexually Active: Not on file     Other Topics Concern   ??? Not on file     Social History Narrative   ??? No narrative on file                  ALLERGIES: Latex and Aldactone      Review of Systems   Constitutional: Negative for fever and chills.   HENT: Negative for ear pain and sore throat.    Eyes: Negative for pain and visual disturbance.   Respiratory: Negative for cough and shortness of breath.    Cardiovascular: Negative for chest pain and palpitations.   Gastrointestinal: Negative for nausea, vomiting, abdominal pain and diarrhea.   Genitourinary: Negative for dysuria, urgency and frequency.   Musculoskeletal: Positive for joint swelling and  arthralgias.        Lue wrist pain/swelling atraumatic   Skin: Negative for color change, pallor, rash and wound.   Neurological: Negative for dizziness and headaches.   Psychiatric/Behavioral: Negative for behavioral problems. The patient is not nervous/anxious.        Filed Vitals:    08/22/13 2332 08/23/13 0115 08/23/13 0128   BP: 157/96 130/60    Pulse: 43 40 50   Temp: 98.2 ??F (36.8 ??C)     Resp: 15 18    SpO2: 100% 99%             Physical Exam   Nursing note and vitals reviewed.  Constitutional: She is oriented to person, place, and time. She appears well-developed and well-nourished. No distress.   HENT:   Head: Normocephalic and atraumatic.   Right Ear: Hearing, tympanic membrane, external ear and ear canal normal.   Left Ear: Hearing, tympanic membrane, external ear and ear canal  normal.   Nose: Nose normal. No mucosal edema or rhinorrhea.   Mouth/Throat: Uvula is midline, oropharynx is clear and moist and mucous membranes are normal. No edematous. No oropharyngeal exudate, posterior oropharyngeal edema, posterior oropharyngeal erythema or tonsillar abscesses.   Eyes: Conjunctivae and EOM are normal. Pupils are equal, round, and reactive to light. Right eye exhibits no discharge. Left eye exhibits no discharge.   Neck: Trachea normal, normal range of motion, full passive range of motion without pain and phonation normal. Neck supple. No tracheal tenderness, no spinous process tenderness and no muscular tenderness present. No rigidity. No tracheal deviation, no edema, no erythema and normal range of motion present. No mass and no thyromegaly present.   Cardiovascular: Regular rhythm and normal heart sounds.  Bradycardia present.  Exam reveals no gallop and no friction rub.    No murmur heard.  Pulmonary/Chest: Effort normal and breath sounds normal. No accessory muscle usage or stridor. Not tachypneic. No respiratory distress. She has no decreased breath sounds. She has no wheezes. She has no rhonchi. She  has no rales.   Abdominal: Soft. Bowel sounds are normal. There is no tenderness. There is no rigidity, no CVA tenderness, no tenderness at McBurney's point and negative Murphy's sign.   Musculoskeletal:        Left elbow: Normal. No tenderness found.        Left wrist: She exhibits tenderness and swelling. She exhibits normal range of motion, no bony tenderness, no effusion, no crepitus, no deformity and no laceration.        Left forearm: Normal.        Left hand: Normal. Normal sensation noted. Normal strength noted.   LUE wrist: Dorsal swelling if focal and minimal. No erythema, ecchymosis, calor. FROM of all digits. Cap refill < 2 seconds. FROM at wrist. Suspect overlying dorsal cyst. Compartments are soft.    Lymphadenopathy:     She has no cervical adenopathy.   Neurological: She is alert and oriented to person, place, and time. She has normal strength and normal reflexes. No sensory deficit.   Skin: Skin is warm and dry. She is not diaphoretic.   Psychiatric: She has a normal mood and affect. Her behavior is normal.        MDM     Differential Diagnosis; Clinical Impression; Plan:     DDX: left Wrist Sprain, Strain, Fracture, Gouty Arthropathy, Dislocation.     Radiology Initial Reading:    Patient had a LUE wrist performed, pt tolerated procedure well, No acute fx or dislocation seen by my eyes.  1:57 AM    Suspect ganglionic cyst. F/U Ortho.     Consulted with Dr. Luisa Dago (EP) concerning patient [name], standard discussion of reason for visit, HPI, ROS, PE, and current results available. Reviewed history with him and informed him of the patient's bradycardia. Recommends ECG. ECG completed revealed Vent Rate 86 bpm. Patient feels fine and says "I'm at baseline, I have [facility] Cardiology appointment 08/25/13." Agrees w/ discharge plan at this time.     Michaelyn Barter, PA  August 23, 2013  1:57 AM     Reviewed workup results, any meds, and discharge instructions OR admission plan with  patient and any family present.  Answered all questions. Michaelyn Barter, PA  1:59 AM    PLEASE FOLLOW-UP AS DIRECTED WITHOUT FAIL WITHIN THE TIME FRAME RECOMMENDED AS FAILURE TO DO SO COULD RESULT IN WORSENING OF YOUR PHYSICAL CONDITION, DEATH, AND OR  PERMANENT DISABILITY.     RETURN TO THE EMERGENCY DEPARTMENT IF YOU ARE UNABLE TO FOLLOW-UP AS DIRECTED.     RETURN TO THE EMERGENCY DEPARTMENT IF YOU HAVE SYMPTOMS THAT DO NOT IMPROVE WITH TREATMENT, NEW SYMPTOMS, WORSENING SYMPTOMS, OR ANY OTHER CONCERNS.     THE PATIENT AGREES WITH THE DISCHARGE PLAN AND FOLLOW-UP INSTRUCTIONS. THE PATIENT AGREES TO REVIEW ALL HANDOUTS.       Amount and/or Complexity of Data Reviewed:   Tests in the radiology section of CPT??:  Ordered and reviewed  Discussion of test results with the performing providers:  Yes   Decide to obtain previous medical records or to obtain history from someone other than the patient:  Yes   Obtain history from someone other than the patient:  No   Review and summarize past medical records:  Yes   Discuss the patient with another provider:  Yes   Independant visualization of image, tracing, or specimen:  Yes  Risk of Significant Complications, Morbidity, and/or Mortality:   Presenting problems:  Moderate  Diagnostic procedures:  Moderate  Management options:  Moderate  Progress:   Patient progress:  Stable      Splint, Thumb Gutter  Date/Time: 08/23/2013 1:58 AM  Performed by: PA (tech)Supervising provider: Morris  Pre-procedure re-eval: Immediately prior to the procedure, the patient was reevaluated and found suitable for the planned procedure and any planned medications.  Time out: Immediately prior to the procedure a time out was called to verify the correct patient, procedure, equipment, staff and marking as appropriate..  Location details: left wrist  Splint type: thumb spica  Approach: anterior, posterior, medial and lateral  Supplies used: aluminum splint  Post-procedure: The splinted body part was  neurovascularly unchanged following the procedure.  Patient tolerance: Patient tolerated the procedure well with no immediate complications.  My total time at bedside, performing this procedure was 1-15 minutes.        Diagnosis:   1. Left wrist pain    2. Ganglion cyst of wrist, left    3. Hypertension    4. Bradycardia          Disposition: HOME    Follow-up Information    Follow up With Details Comments Contact Info    [facility] EMERGENCY DEPT  As needed if symptoms worsen [address]  [place] [address]  [phone]    Italy R Manke, MD Call today Orthopedic Hand Specialist: Schedule appointment to be seen within 1 week.  [address]                          c  [facility]  [place] [address]  [phone]      [facility] Call today Call today to confirm your follow-up appointment on 08/25/13 to be evaluated by Cardiology WITHOUT FAIL!!! [address]  [place] [address]  [phone]          Current Discharge Medication List      START taking these medications    Details   HYDROcodone-acetaminophen (NORCO) 5-325 mg per tablet Take 1 tablet by mouth every four (4) hours as needed for Pain.  Qty: 20 tablet, Refills: 0         CONTINUE these medications which have NOT CHANGED    Details   metFORMIN (GLUCOPHAGE) 500 mg tablet Take 500 mg by mouth two (2) times daily (with meals). Indications: TYPE 2 DIABETES MELLITUS  eplerenone (INSPRA) 25 mg tablet Take 25 mg by mouth daily. Indications: CHRONIC HEART FAILURE FOLLOWING MYOCARDIAL INFARCTION      carvedilol (COREG) 6.25 mg tablet Take 6.25 mg by mouth two (2) times daily (with meals). Indications: CHRONIC HEART FAILURE      lisinopril (PRINIVIL, ZESTRIL) 10 mg tablet Take 10 mg by mouth daily. Indications: HYPERTENSION      nitroglycerin (NITROSTAT) 0.4 mg SL tablet 0.4 mg by SubLINGual route every five (5) minutes as needed for Chest Pain. Indications: ANGINA      aspirin 81 mg chewable tablet Take 81 mg by mouth  daily.      omega-3 fatty acids-vitamin e (FISH OIL) 1,000 mg cap Take 1 Cap by mouth.             Recent Results (from the past 24 hour(s))   EKG, 12 LEAD, INITIAL    Collection Time     08/23/13  1:41 AM       Result Value Range    Ventricular Rate 86      Atrial Rate 86      P-R Interval 184      QRS Duration 116      Q-T Interval 444      QTC Calculation (Bezet) 531      Calculated P Axis 64      Calculated R Axis -42      Calculated T Axis 74      Diagnosis        Value: Sinus rhythm with frequent premature ventricular complexes in a pattern of       bigeminy      Left axis deviation      Left ventricular hypertrophy with QRS widening      Prolonged QT      Abnormal ECG      When compared with ECG of 12-Sep-2012 15:25,      No significant change was found

## 2013-08-23 NOTE — ED Notes (Signed)
Wrist splint applied to (L) wrist, hand without incident. (+)cms before and after application.

## 2014-02-15 DIAGNOSIS — H04123 Dry eye syndrome of bilateral lacrimal glands: Secondary | ICD-10-CM | POA: Insufficient documentation

## 2015-03-29 DIAGNOSIS — E282 Polycystic ovarian syndrome: Secondary | ICD-10-CM | POA: Insufficient documentation

## 2015-03-29 DIAGNOSIS — Z6841 Body Mass Index (BMI) 40.0 and over, adult: Secondary | ICD-10-CM | POA: Insufficient documentation

## 2015-03-29 DIAGNOSIS — F17201 Nicotine dependence, unspecified, in remission: Secondary | ICD-10-CM | POA: Insufficient documentation

## 2015-09-25 ENCOUNTER — Emergency Department (HOSPITAL_COMMUNITY)
Admission: EM | Admit: 2015-09-25 | Discharge: 2015-09-26 | Disposition: A | Discharge status: Home / Self Care | Payer: Self-pay | Attending: Emergency Medicine | Admitting: Emergency Medicine

## 2015-09-25 ENCOUNTER — Encounter (HOSPITAL_COMMUNITY): Payer: Self-pay | Admitting: Vascular Surgery

## 2015-09-25 DIAGNOSIS — I509 Heart failure, unspecified: Secondary | ICD-10-CM | POA: Insufficient documentation

## 2015-09-25 DIAGNOSIS — A084 Viral intestinal infection, unspecified: Secondary | ICD-10-CM | POA: Insufficient documentation

## 2015-09-25 DIAGNOSIS — Z3202 Encounter for pregnancy test, result negative: Secondary | ICD-10-CM | POA: Insufficient documentation

## 2015-09-25 DIAGNOSIS — Z91040 Latex allergy status: Secondary | ICD-10-CM | POA: Insufficient documentation

## 2015-09-25 DIAGNOSIS — Z95810 Presence of automatic (implantable) cardiac defibrillator: Secondary | ICD-10-CM | POA: Insufficient documentation

## 2015-09-25 DIAGNOSIS — Z87891 Personal history of nicotine dependence: Secondary | ICD-10-CM | POA: Insufficient documentation

## 2015-09-25 DIAGNOSIS — R350 Frequency of micturition: Secondary | ICD-10-CM | POA: Insufficient documentation

## 2015-09-25 DIAGNOSIS — E1165 Type 2 diabetes mellitus with hyperglycemia: Secondary | ICD-10-CM | POA: Insufficient documentation

## 2015-09-25 DIAGNOSIS — I10 Essential (primary) hypertension: Secondary | ICD-10-CM | POA: Insufficient documentation

## 2015-09-25 DIAGNOSIS — R739 Hyperglycemia, unspecified: Secondary | ICD-10-CM

## 2015-09-25 HISTORY — DX: Heart failure, unspecified: I50.9

## 2015-09-25 HISTORY — DX: Essential (primary) hypertension: I10

## 2015-09-25 LAB — URINALYSIS, ROUTINE W REFLEX MICROSCOPIC
Bilirubin Urine: NEGATIVE
Glucose, UA: >1000 mg/dL — AB
Hgb urine dipstick: NEGATIVE
Ketones, ur: NEGATIVE mg/dL
LEUKOCYTES UA: NEGATIVE
Nitrite: NEGATIVE
Protein, ur: NEGATIVE mg/dL
Specific Gravity, Urine: 1.039 — ABNORMAL HIGH (ref 1.005–1.030)
pH: 5.5 (ref 5.0–8.0)

## 2015-09-25 LAB — CBC
HCT: 39 % (ref 36.0–46.0)
Hemoglobin: 13.3 g/dL (ref 12.0–15.0)
MCH: 28.7 pg (ref 26.0–34.0)
MCHC: 34.1 g/dL (ref 30.0–36.0)
MCV: 84.1 fL (ref 78.0–100.0)
Platelets: 261 10*3/uL (ref 150–400)
RBC: 4.64 MIL/uL (ref 3.87–5.11)
RDW: 13.1 % (ref 11.5–15.5)
WBC: 8.8 10*3/uL (ref 4.0–10.5)

## 2015-09-25 LAB — COMPREHENSIVE METABOLIC PANEL
ALK PHOS: 75 U/L (ref 38–126)
ALT: 47 U/L (ref 14–54)
AST: 37 U/L (ref 15–41)
Albumin: 3.5 g/dL (ref 3.5–5.0)
Anion gap: 13 (ref 5–15)
BUN: 5 mg/dL — ABNORMAL LOW (ref 6–20)
CO2: 22 mmol/L (ref 22–32)
CREATININE: 0.6 mg/dL (ref 0.44–1.00)
Calcium: 9.1 mg/dL (ref 8.9–10.3)
Chloride: 100 mmol/L — ABNORMAL LOW (ref 101–111)
GFR calc Af Amer: >60 mL/min (ref >60)
GFR calc non Af Amer: >60 mL/min (ref >60)
Glucose, Bld: 336 mg/dL — ABNORMAL HIGH (ref 65–99)
Potassium: 4 mmol/L (ref 3.5–5.1)
Sodium: 135 mmol/L (ref 135–145)
TOTAL PROTEIN: 6.7 g/dL (ref 6.5–8.1)
Total Bilirubin: 0.6 mg/dL (ref 0.3–1.2)

## 2015-09-25 LAB — URINE MICROSCOPIC-ADD ON

## 2015-09-25 LAB — POC URINE PREG, ED: Preg Test, Ur: NEGATIVE

## 2015-09-25 LAB — LIPASE, BLOOD: LIPASE: 27 U/L (ref 11–51)

## 2015-09-25 MED ORDER — SODIUM CHLORIDE 0.9 % IV BOLUS (SEPSIS)
1000.0000 mL | Freq: Once | INTRAVENOUS | Status: AC
  Administered 2015-09-25: 1000 mL via INTRAVENOUS

## 2015-09-25 MED ORDER — ONDANSETRON 4 MG PO TBDP
4.0000 mg | ORAL_TABLET | Freq: Once | ORAL | Status: AC | PRN
  Administered 2015-09-25: 4 mg via ORAL

## 2015-09-25 MED ORDER — ONDANSETRON 4 MG PO TBDP
ORAL_TABLET | ORAL | Status: AC
  Filled 2015-09-25: qty 1

## 2015-09-25 MED ORDER — METFORMIN HCL 500 MG PO TABS
500.0000 mg | ORAL_TABLET | Freq: Once | ORAL | Status: AC
  Administered 2015-09-25: 500 mg via ORAL
  Filled 2015-09-25: qty 1

## 2015-09-25 MED ORDER — METOCLOPRAMIDE HCL 5 MG/ML IJ SOLN
10.0000 mg | INTRAMUSCULAR | Status: AC
  Administered 2015-09-25: 10 mg via INTRAVENOUS
  Filled 2015-09-25: qty 2

## 2015-09-25 NOTE — ED Provider Notes (Signed)
CSN: [id number]     Arrival date & time 09/25/15  1705 History   First MD Initiated Contact with Patient 09/25/15 2127     Chief Complaint  Patient presents with  . Emesis     (Consider location/radiation/quality/duration/timing/severity/associated sxs/prior Treatment) HPI Comments: 30 year old female with a history of CHF, status post cardiac defibrillator placement, diabetes mellitus, and hypertension presents to the emergency department for evaluation of nausea, vomiting, and diarrhea over the past 4 days. Symptoms associated with a low-grade fever of 100.33F which has resolved over the past 48 hours. Patient had some mild diffuse abdominal pain intermittently. She has no complaints of abdominal pain at this time. 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 Patient denies chest pain, shortness of breath, syncope, hematemesis, melena, hematochezia, dysuria, and urinary urgency. She has been having some mild urinary frequency which may be due to fluctuating blood sugars. Patient has been out of all of her medications for the past month since moving to the area from [place].  The history is provided by the patient. No language interpreter was used.    Past Medical History  Diagnosis Date  . CHF (congestive heart failure) (HCC)   . Diabetes mellitus without complication (HCC)   . Hypertension    Past Surgical History  Procedure Laterality Date  . Cardiac defibrillator placement     No family history on file. Social History  Substance Use Topics  . Smoking status: Former Games developer  . Smokeless tobacco: Never Used  . Alcohol Use: Yes     Comment: rarely   OB History    No data available      Review of Systems  Constitutional: Positive for fever (Resolved).  Respiratory: Negative for shortness of breath.   Cardiovascular: Negative for chest pain.  Gastrointestinal: Positive for vomiting and diarrhea. Negative for  abdominal pain.  Genitourinary: Positive for frequency. Negative for dysuria and urgency.  All other systems reviewed and are negative.   Allergies  Latex  Home Medications  Lasix  QD PRN Coreg 12.5mg  BID Lisinopril  QD Metformin  BID  Prior to Admission medications   Not on File   BP 113/57 mmHg  Pulse 65  Temp(Src) 98.5 F (36.9 C) (Oral)  Resp 18  Ht  (1.676 m)  Wt 116.438 kg  BMI 41.45 kg/m2  SpO2 99%   Physical Exam  Constitutional: She is oriented to person, place, and time. She appears well-developed and well-nourished. No distress.  Nontoxic/nonseptic appearing  HENT:  Head: Normocephalic and atraumatic.  Mouth/Throat: No oropharyngeal exudate.  Mildly dry mm  Eyes: Conjunctivae and EOM are normal. No scleral icterus.  Neck: Normal range of motion.  Cardiovascular: Normal rate, regular rhythm and intact distal pulses.   Pulmonary/Chest: Effort normal and breath sounds normal. No respiratory distress. She has no wheezes. She has no rales.  Respirations even and unlabored  Abdominal: Soft. She exhibits no distension. There is no tenderness. There is no rebound and no guarding.  Soft, obese, nontender abdomen  Musculoskeletal: Normal range of motion.  Neurological: She is alert and oriented to person, place, and time. She exhibits normal muscle tone. Coordination normal.  Skin: Skin is warm and dry. No rash noted. She is not diaphoretic. No erythema. No pallor.  Psychiatric: She has a normal mood and affect. Her behavior is normal.  Nursing note and vitals reviewed.   ED Course  Procedures (including critical care time) Labs  Review Labs Reviewed  COMPREHENSIVE METABOLIC PANEL - Abnormal; Notable for the following:    Chloride 100 (*)    Glucose, Bld 336 (*)    BUN 5 (*)    All other components within normal limits  URINALYSIS, ROUTINE W REFLEX MICROSCOPIC (NOT AT [facility]) - Abnormal; Notable for the following:    Specific Gravity, Urine  1.039 (*)    Glucose, UA >1000 (*)    All other components within normal limits  URINE MICROSCOPIC-ADD ON - Abnormal; Notable for the following:    Squamous Epithelial / LPF 0-5 (*)    Bacteria, UA FEW (*)    All other components within normal limits  LIPASE, BLOOD  CBC  POC URINE PREG, ED    Imaging Review No results found.   I have personally reviewed and evaluated these images and lab results as part of my medical decision-making.   EKG Interpretation None      12:15 AM Per nurse, patient tolerated oral medication and water well with no emesis. Plan to d/c after completion of IVF.  MDM   Final diagnoses:  Viral gastroenteritis  Hyperglycemia    30 year old female with a history of diabetes mellitus and heart failure, status post pacemaker placement, presents to the ED for vomiting and diarrhea. Symptoms consistent with viral gastroenteritis. Laboratory workup today is noncontributory. Patient does have hyperglycemia which is likely secondary to being out of her metformin. No evidence of DKA; normal anion gap and no ketonuria. Patient with signs of mild dehydration with increased urine specific gravity. She has been hydrated with IV fluids and able to tolerate oral medications and liquids without further vomiting. No complaints of chest pain or shortness of breath.  Patient reports that she has been out of her daily medications for one month after moving to the area from [place]. Patient's daily prescriptions refilled. She has been given a referral to cardiology given her CHF history. Will also provide primary care resources. No indication for further emergent workup at this time. Patient discharged in good condition with no unaddressed concerns.   Filed Vitals:   09/25/15 2333 09/25/15 2336 09/25/15 2358 09/26/15 0001  BP: 113/57  130/80   Pulse:  65  96  Temp:      TempSrc:      Resp:      Height:      Weight:      SpO2: 97% 99% 100%       Antony Madura,  PA-C 09/26/15 7408  Pricilla Loveless, MD 10/02/15 1427

## 2015-09-25 NOTE — ED Notes (Signed)
IV placement attempted by 2 RNs unsuccessfully

## 2015-09-25 NOTE — ED Notes (Addendum)
Pt reports to the ED for eval of N/V/D since Friday. She reports on Friday and Saturday she had a fever but that has resolved. Reports some mild abd pain intermittently but none now. Pt also reports fluctuating CBGs. Pt reports urinary frequency. Denies any urgency or dysuria. Pt A&Ox4, resp e/u, and skin warm and dry.

## 2015-09-26 LAB — CBG MONITORING, ED: GLUCOSE-CAPILLARY: 193 mg/dL — AB (ref 65–99)

## 2015-09-26 MED ORDER — CARVEDILOL 12.5 MG PO TABS: 12.5000 mg | ORAL_TABLET | Freq: Two times a day (BID) | ORAL | Status: DC

## 2015-09-26 MED ORDER — LISINOPRIL 20 MG PO TABS: 20.0000 mg | ORAL_TABLET | Freq: Every day | ORAL | Status: DC

## 2015-09-26 MED ORDER — FUROSEMIDE 20 MG PO TABS: 10.0000 mg | ORAL_TABLET | Freq: Every day | ORAL | Status: DC | PRN

## 2015-09-26 MED ORDER — PROMETHAZINE HCL 25 MG PO TABS: 25.0000 mg | ORAL_TABLET | Freq: Four times a day (QID) | ORAL | Status: DC | PRN

## 2015-09-26 MED ORDER — METFORMIN HCL 500 MG PO TABS: 500.0000 mg | ORAL_TABLET | Freq: Two times a day (BID) | ORAL | Status: DC

## 2015-09-26 NOTE — ED Notes (Addendum)
Pt took metformin with water without difficulty. Refused a different beverage choice.

## 2015-09-26 NOTE — Discharge Instructions (Signed)
Viral Gastroenteritis Viral gastroenteritis is also known as stomach flu. This condition affects the stomach and intestinal tract. It can cause sudden diarrhea and vomiting. The illness typically lasts 3 to 8 days. Most people develop an immune response that eventually gets rid of the virus. While this natural response develops, the virus can make you quite ill. CAUSES  Many different viruses can cause gastroenteritis, such as rotavirus or noroviruses. You can catch one of these viruses by consuming contaminated food or water. You may also catch a virus by sharing utensils or other personal items with an infected person or by touching a contaminated surface. SYMPTOMS  The most common symptoms are diarrhea and vomiting. These problems can cause a severe loss of body fluids (dehydration) and a body salt (electrolyte) imbalance. Other symptoms may include:  Fever.  Headache.  Fatigue.  Abdominal pain. DIAGNOSIS  Your caregiver can usually diagnose viral gastroenteritis based on your symptoms and a physical exam. A stool sample may also be taken to test for the presence of viruses or other infections. TREATMENT  This illness typically goes away on its own. Treatments are aimed at rehydration. The most serious cases of viral gastroenteritis involve vomiting so severely that you are not able to keep fluids down. In these cases, fluids must be given through an intravenous line (IV). HOME CARE INSTRUCTIONS   Drink enough fluids to keep your urine clear or pale yellow. Drink small amounts of fluids frequently and increase the amounts as tolerated.  Ask your caregiver for specific rehydration instructions.  Avoid:  Foods high in sugar.  Alcohol.  Carbonated drinks.  Tobacco.  Juice.  Caffeine drinks.  Extremely hot or cold fluids.  Fatty, greasy foods.  Too much intake of anything at one time.  Dairy products until 24 to 48 hours after diarrhea stops.  You may consume probiotics.  Probiotics are active cultures of beneficial bacteria. They may lessen the amount and number of diarrheal stools in adults. Probiotics can be found in yogurt with active cultures and in supplements.  Wash your hands well to avoid spreading the virus.  Only take over-the-counter or prescription medicines for pain, discomfort, or fever as directed by your caregiver. Do not give aspirin to children. Antidiarrheal medicines are not recommended.  Ask your caregiver if you should continue to take your regular prescribed and over-the-counter medicines.  Keep all follow-up appointments as directed by your caregiver. SEEK IMMEDIATE MEDICAL CARE IF:   You are unable to keep fluids down.  You do not urinate at least once every 6 to 8 hours.  You develop shortness of breath.  You notice blood in your stool or vomit. This may look like coffee grounds.  You have abdominal pain that increases or is concentrated in one small area (localized).  You have persistent vomiting or diarrhea.  You have a fever.  The patient is a child younger than 3 months, and he or she has a fever.  The patient is a child older than 3 months, and he or she has a fever and persistent symptoms.  The patient is a child older than 3 months, and he or she has a fever and symptoms suddenly get worse.  The patient is a baby, and he or she has no tears when crying. MAKE SURE YOU:   Understand these instructions.  Will watch your condition.  Will get help right away if you are not doing well or get worse.   This information is not intended to replace  advice given to you by your health care provider. Make sure you discuss any questions you have with your health care provider.   Document Released: 07/06/2005 Document Revised: 09/28/2011 Document Reviewed: 04/22/2011 Elsevier Interactive Patient Education 2016 Elsevier Inc[employer]  Hyperglycemia High blood sugar (hyperglycemia) means that the level of sugar in your blood is  higher than it should be. Signs of high blood sugar include:  Feeling thirsty.  Frequent peeing (urinating).  Feeling tired or sleepy.  Dry mouth.  Vision changes.  Feeling weak.  Feeling hungry but losing weight.  Numbness and tingling in your hands or feet.  Headache. When you ignore these signs, your blood sugar may keep going up. These problems may get worse, and other problems may begin. HOME CARE  Check your blood sugars as told by your doctor. Write down the numbers with the date and time.  Take the right amount of insulin or diabetes pills at the right time. Write down the dose with date and time.  Refill your insulin or diabetes pills before running out.  Watch what you eat. Follow your meal plan.  Drink liquids without sugar, such as water. Check with your doctor if you have kidney or heart disease.  Follow your doctor's orders for exercise. Exercise at the same time of day.  Keep your doctor's appointments. GET HELP RIGHT AWAY IF:   You have trouble thinking or are confused.  You have fast breathing with fruity smelling breath.  You pass out (faint).  You have 2 to 3 days of high blood sugars and you do not know why.  You have chest pain.  You are feeling sick to your stomach (nauseous) or throwing up (vomiting).  You have sudden vision changes. MAKE SURE YOU:   Understand these instructions.  Will watch your condition.  Will get help right away if you are not doing well or get worse.   This information is not intended to replace advice given to you by your health care provider. Make sure you discuss any questions you have with your health care provider.   Document Released: 05/03/2009 Document Revised: 07/27/2014 Document Reviewed: 03/12/2015 Elsevier Interactive Patient Education [employer]

## 2016-01-14 ENCOUNTER — Emergency Department (HOSPITAL_COMMUNITY)
Admission: EM | Admit: 2016-01-14 | Discharge: 2016-01-14 | Disposition: A | Discharge status: Home / Self Care | Payer: PRIVATE HEALTH INSURANCE | Attending: Dermatology | Admitting: Dermatology

## 2016-01-14 ENCOUNTER — Emergency Department (HOSPITAL_COMMUNITY): Payer: PRIVATE HEALTH INSURANCE

## 2016-01-14 ENCOUNTER — Encounter (HOSPITAL_COMMUNITY): Payer: Self-pay | Admitting: Adult Health

## 2016-01-14 DIAGNOSIS — Z87891 Personal history of nicotine dependence: Secondary | ICD-10-CM | POA: Diagnosis not present

## 2016-01-14 DIAGNOSIS — Z5321 Procedure and treatment not carried out due to patient leaving prior to being seen by health care provider: Secondary | ICD-10-CM | POA: Insufficient documentation

## 2016-01-14 DIAGNOSIS — J029 Acute pharyngitis, unspecified: Secondary | ICD-10-CM | POA: Diagnosis not present

## 2016-01-14 LAB — BASIC METABOLIC PANEL
Anion gap: 6 (ref 5–15)
BUN: 6 mg/dL (ref 6–20)
CALCIUM: 9.2 mg/dL (ref 8.9–10.3)
CHLORIDE: 103 mmol/L (ref 101–111)
CO2: 23 mmol/L (ref 22–32)
CREATININE: 0.71 mg/dL (ref 0.44–1.00)
GFR calc Af Amer: >60 mL/min (ref >60)
GFR calc non Af Amer: >60 mL/min (ref >60)
Glucose, Bld: 349 mg/dL — ABNORMAL HIGH (ref 65–99)
Potassium: 4 mmol/L (ref 3.5–5.1)
SODIUM: 132 mmol/L — AB (ref 135–145)

## 2016-01-14 LAB — CBC
HCT: 40.3 % (ref 36.0–46.0)
Hemoglobin: 13.1 g/dL (ref 12.0–15.0)
MCH: 27.2 pg (ref 26.0–34.0)
MCHC: 32.5 g/dL (ref 30.0–36.0)
MCV: 83.6 fL (ref 78.0–100.0)
PLATELETS: 235 10*3/uL (ref 150–400)
RBC: 4.82 MIL/uL (ref 3.87–5.11)
RDW: 13.2 % (ref 11.5–15.5)
WBC: 8.8 10*3/uL (ref 4.0–10.5)

## 2016-01-14 LAB — I-STAT TROPONIN, ED: TROPONIN I, POC: 0.01 ng/mL (ref 0.00–0.08)

## 2016-01-14 LAB — RAPID STREP SCREEN (MED CTR MEBANE ONLY): Streptococcus, Group A Screen (Direct): NEGATIVE

## 2016-01-14 NOTE — ED Notes (Signed)
PT CAME TO NURSE FIRST DESK, REPORTS SHE HAS BEEN SITTING OUTSIDE.

## 2016-01-14 NOTE — ED Notes (Signed)
PT CAME TO NURSE FIRST TO SAY SHE IS LEAVING, DOES NOT WANT TO WAIT ANY LONGER.

## 2016-01-14 NOTE — ED Notes (Addendum)
Presents with sore throat began yesterday and fever of 101.0 at home, today began having SOB, fever and chest pains. Became concerned due to CHF hx, denies edema. Breath sounds clear. Redness, enlarged tonsils with white exudate noted. Denies chest pain at this time. Chest pain comes and go and is worse with inspiration.

## 2016-01-17 LAB — CULTURE, GROUP A STREP (THRC)

## 2016-07-12 ENCOUNTER — Emergency Department (HOSPITAL_COMMUNITY)
Admission: EM | Admit: 2016-07-12 | Discharge: 2016-07-12 | Disposition: A | Discharge status: Home / Self Care | Payer: PRIVATE HEALTH INSURANCE | Attending: Emergency Medicine | Admitting: Emergency Medicine

## 2016-07-12 ENCOUNTER — Emergency Department (HOSPITAL_COMMUNITY): Payer: PRIVATE HEALTH INSURANCE

## 2016-07-12 ENCOUNTER — Encounter (HOSPITAL_COMMUNITY): Payer: Self-pay | Admitting: *Deleted

## 2016-07-12 DIAGNOSIS — R0789 Other chest pain: Secondary | ICD-10-CM | POA: Diagnosis not present

## 2016-07-12 DIAGNOSIS — Z7984 Long term (current) use of oral hypoglycemic drugs: Secondary | ICD-10-CM | POA: Insufficient documentation

## 2016-07-12 DIAGNOSIS — I110 Hypertensive heart disease with heart failure: Secondary | ICD-10-CM | POA: Insufficient documentation

## 2016-07-12 DIAGNOSIS — E119 Type 2 diabetes mellitus without complications: Secondary | ICD-10-CM | POA: Insufficient documentation

## 2016-07-12 DIAGNOSIS — I509 Heart failure, unspecified: Secondary | ICD-10-CM | POA: Diagnosis not present

## 2016-07-12 DIAGNOSIS — Z87891 Personal history of nicotine dependence: Secondary | ICD-10-CM | POA: Diagnosis not present

## 2016-07-12 DIAGNOSIS — Z79899 Other long term (current) drug therapy: Secondary | ICD-10-CM | POA: Diagnosis not present

## 2016-07-12 DIAGNOSIS — Z91040 Latex allergy status: Secondary | ICD-10-CM | POA: Insufficient documentation

## 2016-07-12 DIAGNOSIS — R079 Chest pain, unspecified: Secondary | ICD-10-CM | POA: Diagnosis present

## 2016-07-12 DIAGNOSIS — Z95810 Presence of automatic (implantable) cardiac defibrillator: Secondary | ICD-10-CM | POA: Insufficient documentation

## 2016-07-12 LAB — CBC
HCT: 39.6 % (ref 36.0–46.0)
HEMOGLOBIN: 13.4 g/dL (ref 12.0–15.0)
MCH: 28.9 pg (ref 26.0–34.0)
MCHC: 33.8 g/dL (ref 30.0–36.0)
MCV: 85.5 fL (ref 78.0–100.0)
Platelets: 251 10*3/uL (ref 150–400)
RBC: 4.63 MIL/uL (ref 3.87–5.11)
RDW: 13.1 % (ref 11.5–15.5)
WBC: 9.5 10*3/uL (ref 4.0–10.5)

## 2016-07-12 LAB — BASIC METABOLIC PANEL
ANION GAP: 10 (ref 5–15)
BUN: 11 mg/dL (ref 6–20)
CALCIUM: 9.6 mg/dL (ref 8.9–10.3)
CHLORIDE: 104 mmol/L (ref 101–111)
CO2: 23 mmol/L (ref 22–32)
Creatinine, Ser: 0.73 mg/dL (ref 0.44–1.00)
GFR calc non Af Amer: >60 mL/min (ref >60)
Glucose, Bld: 167 mg/dL — ABNORMAL HIGH (ref 65–99)
Potassium: 3.9 mmol/L (ref 3.5–5.1)
Sodium: 137 mmol/L (ref 135–145)

## 2016-07-12 LAB — TROPONIN I

## 2016-07-12 LAB — BRAIN NATRIURETIC PEPTIDE: B Natriuretic Peptide: 98.5 pg/mL (ref 0.0–100.0)

## 2016-07-12 MED ORDER — MORPHINE SULFATE (PF) 4 MG/ML IV SOLN
4.0000 mg | Freq: Once | INTRAVENOUS | Status: AC
  Administered 2016-07-12: 4 mg via INTRAVENOUS
  Filled 2016-07-12: qty 1

## 2016-07-12 MED ORDER — LISINOPRIL 20 MG PO TABS: 20.0000 mg | ORAL_TABLET | Freq: Every day | ORAL | 1 refills | Status: DC

## 2016-07-12 MED ORDER — FUROSEMIDE 20 MG PO TABS: 20.0000 mg | ORAL_TABLET | Freq: Every day | ORAL | 1 refills | Status: DC

## 2016-07-12 MED ORDER — ASPIRIN 81 MG PO CHEW
324.0000 mg | CHEWABLE_TABLET | Freq: Once | ORAL | Status: AC
  Administered 2016-07-12: 324 mg via ORAL
  Filled 2016-07-12: qty 4

## 2016-07-12 MED ORDER — CARVEDILOL 12.5 MG PO TABS: 12.5000 mg | ORAL_TABLET | Freq: Two times a day (BID) | ORAL | 1 refills | Status: DC

## 2016-07-12 NOTE — ED Triage Notes (Signed)
The pt is c/o mid-chest pain with nausea  And dizziness.  The pt has a  Pacemaker defrib from [facility] .  Its a st judes and she feels like the pacemaker acting up and her pulsed has been slow at times  lmp dec 5th

## 2016-07-12 NOTE — ED Notes (Signed)
ED Provider at bedside. 

## 2016-07-12 NOTE — Discharge Instructions (Signed)
Continue all your current medications. Follow up with a heart failure clinic as referred. Return if worsening symptoms.

## 2016-07-12 NOTE — ED Provider Notes (Signed)
[facility]-EMERGENCY DEPT Provider Note   CSN: [id number] Arrival date & time: 07/12/16  4098     History   Chief Complaint Chief Complaint  Patient presents with  . Chest Pain    HPI Kelly [name] is a 30 y.o. female.  HPI Kelly [name] is a 30 y.o. female with history of congestive heart failure hypertension, diabetes, presents to emergency department complaining of chest pain. Patient states her pain started around 2 AM while she was at work. She states pain is in the center of the chest and radiates to the left chest. She reports pain feeling more like pressure. She denies any shortness of breath, she reports some dizziness and lightheadedness. She reports that she is unable to lay down flat and could not sleep yesterday because of that. She reports no leg swelling, however states she gained more than 5 pounds in the last week. She believes she is retaining fluid. Patient is to be followed by cardiology and heart failure clinic in [place], however she moved down to this area does not have a cardiologist here. She does have a defibrillator/pacemaker, and states that she feels like it may not be working properly because she has noticed her heart rate being low. This was placed in 2009, she has not had any issues with it since then. Patient also reports that she has not been taking any of her medications except for metformin in multiple months because she does not like taking them. She reports prior cardiac cath, states many years ago, states was normal. Denies any history of coronary disease. No history of blood clots. No other complaints at this time.   Past Medical History:  Diagnosis Date  . CHF (congestive heart failure) (HCC)   . Diabetes mellitus without complication (HCC)   . Hypertension     There are no active problems to display for this patient.   Past Surgical History:  Procedure Laterality Date  . CARDIAC DEFIBRILLATOR PLACEMENT      OB History    No  data available       Home Medications    Prior to Admission medications   Medication Sig Start Date End Date Taking? Authorizing Provider  carvedilol (COREG) 12.5 MG tablet Take 1 tablet (12.5 mg total) by mouth 2 (two) times daily with a meal. 09/26/15   Antony Madura, PA-C  furosemide (LASIX) 20 MG tablet Take 0.5 tablets (10 mg total) by mouth daily as needed for fluid or edema. 09/26/15   Antony Madura, PA-C  lisinopril (PRINIVIL,ZESTRIL) 20 MG tablet Take 1 tablet (20 mg total) by mouth daily. 09/26/15   Antony Madura, PA-C  metFORMIN (GLUCOPHAGE) 500 MG tablet Take 1 tablet (500 mg total) by mouth 2 (two) times daily with a meal. 09/26/15   Antony Madura, PA-C  promethazine (PHENERGAN) 25 MG tablet Take 1 tablet (25 mg total) by mouth every 6 (six) hours as needed for nausea or vomiting. 09/26/15   Antony Madura, PA-C    Family History No family history on file.  Social History Social History  Substance Use Topics  . Smoking status: Former Games developer  . Smokeless tobacco: Never Used  . Alcohol use Yes     Comment: rarely     Allergies   Aldactone [spironolactone] and Latex   Review of Systems Review of Systems  Constitutional: Negative for chills and fever.  Respiratory: Positive for chest tightness and shortness of breath. Negative for cough.   Cardiovascular: Positive for chest pain. Negative for  palpitations and leg swelling.  Gastrointestinal: Negative for abdominal pain, diarrhea, nausea and vomiting.  Genitourinary: Negative for dysuria, flank pain and pelvic pain.  Musculoskeletal: Negative for arthralgias, myalgias, neck pain and neck stiffness.  Skin: Negative for rash.  Neurological: Positive for dizziness. Negative for weakness and headaches.  All other systems reviewed and are negative.    Physical Exam Updated Vital Signs BP 126/62   Pulse 98   Temp 98 F (36.7 C) (Oral)   Resp 19   LMP 06/23/2016   SpO2 96%   Physical Exam  Constitutional: She appears  well-developed and well-nourished. No distress.  HENT:  Head: Normocephalic.  Eyes: Conjunctivae are normal.  Neck: Neck supple.  Cardiovascular: Normal rate and normal heart sounds.   Irregular  Pulmonary/Chest: Effort normal and breath sounds normal. No respiratory distress. She has no wheezes. She has no rales.  Abdominal: Soft. Bowel sounds are normal. She exhibits no distension. There is no tenderness. There is no rebound.  Musculoskeletal: She exhibits no edema.  Neurological: She is alert.  Skin: Skin is warm and dry.  Psychiatric: She has a normal mood and affect. Her behavior is normal.  Nursing note and vitals reviewed.    ED Treatments / Results  Labs (all labs ordered are listed, but only abnormal results are displayed) Labs Reviewed  BASIC METABOLIC PANEL - Abnormal; Notable for the following:       Result Value   Glucose, Bld 167 (*)    All other components within normal limits  CBC  TROPONIN I  BRAIN NATRIURETIC PEPTIDE  TROPONIN I    EKG  EKG Interpretation  Date/Time:  Sunday July 12 2016 06:19:51 EST Ventricular Rate:  100 PR Interval:  178 QRS Duration: 102 QT Interval:  396 QTC Calculation: 510 R Axis:   -57 Text Interpretation:  Sinus rhythm with frequent Premature ventricular complexes in a pattern of bigeminy Left anterior fascicular block Left ventricular hypertrophy Prolonged QT Abnormal ECG When compared with ECG of 01/14/2016, Premature ventricular complexes are now Present QT has lengthened Confirmed by [facility]  MD, DAVID ([id number]) on 07/12/2016 6:25:41 AM       Radiology Dg Chest 2 View  Result Date: 07/12/2016 CLINICAL DATA:  Pacemaker beating irregularly for 1.5 weeks worse over past 2 days, increased weight gain, mid chest pain, shortness of breath, coughing congestion, dizziness, history hypertension, diabetes mellitus, smoker EXAM: CHEST  2 VIEW COMPARISON:  01/14/2016 FINDINGS: LEFT subclavian AICD with leads projecting at RIGHT  atrium and RIGHT ventricle, unchanged. Enlargement of cardiac silhouette. Mediastinal contours and pulmonary vascularity normal. Lungs clear. No pleural effusion or pneumothorax. Bones unremarkable. IMPRESSION: Mild enlargement of cardiac silhouette post AICD. No acute abnormalities. Electronically Signed   By: Ulyses Southward M.D.   On: 07/12/2016 07:41    Procedures Procedures (including critical care time)  Medications Ordered in ED Medications  aspirin chewable tablet 324 mg (not administered)     Initial Impression / Assessment and Plan / ED Course  I have reviewed the triage vital signs and the nursing notes.  Pertinent labs & imaging results that were available during my care of the patient were reviewed by me and considered in my medical decision making (see chart for details).  Clinical Course     Pt with pacemaker placed in 2009 in [place] for CHF. Chart review showed negative Lexiscan with ventricular paced rhythm with frequent PVCs and bigeminy on 10/19/2013,  Echo 06/18/13 showed moderate to severe left vent dilation, EF of  15% with global hypokinesis. Unable to find Report. Will check labs and chest x-ray today. Patient is in no acute distress. Will interrogate pacemaker.  10:20 AM Initial troponin, BNP, labs all unremarkable. Chest x-ray with no evidence of active fluid overload. Vital signs remain normal. Pacemaker interrogated with no abnormalities other than battery of running low, only approximately 3 months left of battery. I discussed these findings with patient. She would like to try to go home if possible. We will repeat troponin. I discussed with Dr. Cindra EvesMacdowell with cardiology, he will try to send a message to the failure clinic to see if patient can be seen for close follow-up and to establish care.  12:34 PM Repeat troponin is negative. We'll discharge home, will refill all her regular medications. She will follow-up with cardiology. Return precautions discussed.  Vitals:    07/12/16 1130 07/12/16 1145 07/12/16 1200 07/12/16 1215  BP: 134/65 125/73 128/66 132/77  Pulse: (!) 49 67 70 77  Resp: 17 14 17 19   Temp:      TempSrc:      SpO2: 98% 99% 97% 97%       Final Clinical Impressions(s) / ED Diagnoses   Final diagnoses:  Atypical chest pain    New Prescriptions New Prescriptions   CARVEDILOL (COREG) 12.5 MG TABLET    Take 1 tablet (12.5 mg total) by mouth 2 (two) times daily with a meal.   FUROSEMIDE (LASIX) 20 MG TABLET    Take 1 tablet (20 mg total) by mouth daily.   LISINOPRIL (PRINIVIL,ZESTRIL) 20 MG TABLET    Take 1 tablet (20 mg total) by mouth daily.     Jaynie Crumbleatyana Claryce Friel, PA-C 07/12/16 1239    Linwood DibblesJon Knapp, MD 07/12/16 1536

## 2016-07-12 NOTE — ED Notes (Signed)
St jude pacemaker interrogated 

## 2016-07-14 ENCOUNTER — Telehealth (HOSPITAL_COMMUNITY): Payer: Self-pay | Admitting: *Deleted

## 2016-07-14 NOTE — Telephone Encounter (Signed)
New patient information packet mailed to patients home address.

## 2016-07-21 ENCOUNTER — Encounter (HOSPITAL_COMMUNITY): Payer: Self-pay

## 2016-07-21 ENCOUNTER — Ambulatory Visit (HOSPITAL_COMMUNITY)
Admission: RE | Admit: 2016-07-21 | Discharge: 2016-07-21 | Disposition: A | Discharge status: Home / Self Care | Payer: PRIVATE HEALTH INSURANCE | Source: Ambulatory Visit | Attending: Cardiology | Admitting: Cardiology

## 2016-07-21 VITALS — BP 144/102 | HR 91 | Ht 66.0 in | Wt 243.0 lb

## 2016-07-21 DIAGNOSIS — I493 Ventricular premature depolarization: Secondary | ICD-10-CM

## 2016-07-21 DIAGNOSIS — Z7984 Long term (current) use of oral hypoglycemic drugs: Secondary | ICD-10-CM | POA: Insufficient documentation

## 2016-07-21 DIAGNOSIS — Z7982 Long term (current) use of aspirin: Secondary | ICD-10-CM | POA: Insufficient documentation

## 2016-07-21 DIAGNOSIS — I10 Essential (primary) hypertension: Secondary | ICD-10-CM

## 2016-07-21 DIAGNOSIS — F17210 Nicotine dependence, cigarettes, uncomplicated: Secondary | ICD-10-CM | POA: Insufficient documentation

## 2016-07-21 DIAGNOSIS — I110 Hypertensive heart disease with heart failure: Secondary | ICD-10-CM | POA: Insufficient documentation

## 2016-07-21 DIAGNOSIS — I5022 Chronic systolic (congestive) heart failure: Secondary | ICD-10-CM | POA: Insufficient documentation

## 2016-07-21 DIAGNOSIS — Z95810 Presence of automatic (implantable) cardiac defibrillator: Secondary | ICD-10-CM

## 2016-07-21 LAB — BASIC METABOLIC PANEL
Anion gap: 9 (ref 5–15)
BUN: <5 mg/dL — ABNORMAL LOW (ref 6–20)
CALCIUM: 9.2 mg/dL (ref 8.9–10.3)
CO2: 23 mmol/L (ref 22–32)
CREATININE: 0.69 mg/dL (ref 0.44–1.00)
Chloride: 106 mmol/L (ref 101–111)
Glucose, Bld: 149 mg/dL — ABNORMAL HIGH (ref 65–99)
Potassium: 3.5 mmol/L (ref 3.5–5.1)
Sodium: 138 mmol/L (ref 135–145)

## 2016-07-21 LAB — TSH: TSH: 1.433 u[IU]/mL (ref 0.350–4.500)

## 2016-07-21 LAB — MAGNESIUM: MAGNESIUM: 1.6 mg/dL — AB (ref 1.7–2.4)

## 2016-07-21 MED ORDER — SACUBITRIL-VALSARTAN 49-51 MG PO TABS: 1.0000 | ORAL_TABLET | Freq: Two times a day (BID) | ORAL | 3 refills | Status: DC

## 2016-07-21 NOTE — Progress Notes (Signed)
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 Prior to relocation she was followed by Dr Jeannett Senior at [facility] in [place]. She has a history of chronic systolic heart failure diagnosed 2005, St Jude ICD, LHC 2005 no blockages, HTN, and DMII.   Recently evaluated [facility] ED 07/12/16 with CP. Troponin was negative and she was not thought to be overloaded. St Jude device interrogation showed frequent PVCs and ERI. She was discharged with follow up to the HF clinic    Today she presents as a new patient at the request of Dr Lynelle Doctor for chronic systolic heart failure. Relocated to [place] over a year ago but has not established cardiology care.  Overall feeling ok. Intermittent dyspnea with exertion. Over the last 6 months she has lost 40 pounds. Taking lasix about about twice a week. Denies PND. Complaining of dry cough. ICD at [facility] and she would like EP referral.  Has not had medications in the last 24 hours because she was out of town.  She is not on spiro due to allergy. Smokes 5 cigarettes per day. Does not drink alcohol. 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 Lives with boyfriend and sister.   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 or smoke FH: No family history of CAD    Review of Systems: [y] = yes, [ ]  = no   General: Weight gain [ ] ; Weight loss [ Y]; Anorexia [ ] ; Fatigue [ ] ; Fever [ ] ; Chills [ ] ; Weakness [ ]   Cardiac: Chest pain/pressure [ ] ; Resting SOB [ ] ; Exertional SOB [Y ]; Orthopnea [ ] ; Pedal Edema [ ] ; Palpitations [ ] ; Syncope [ ] ; Presyncope [ ] ; Paroxysmal nocturnal dyspnea[ ]   Pulmonary: Cough Gilian.Kraft ]; Wheezing[ ] ; Hemoptysis[ ] ; Sputum [ ] ; Snoring [ ]   GI: Vomiting[ ] ; Dysphagia[ ] ; Melena[ ] ; Hematochezia [ ] ; Heartburn[ ] ; Abdominal pain [ ] ; Constipation [ ] ; Diarrhea [ ] ; BRBPR [ ]     GU: Hematuria[ ] ; Dysuria [ ] ; Nocturia[ ]   Vascular: Pain in legs with walking [ ] ; Pain in feet with lying flat [ ] ; Non-healing sores [ ] ; Stroke [ ] ; TIA [ ] ; Slurred speech [ ] ;  Neuro: Headaches[ ] ; Vertigo[ ] ; Seizures[ ] ; Paresthesias[ ] ;Blurred vision [ ] ; Diplopia [ ] ; Vision changes [ ]   Ortho/Skin: Arthritis [ ] ; Joint pain [ ] ; Muscle pain [ ] ; Joint swelling [ ] ; Back Pain [Y ]; Rash [ ]   Psych: Depression[ ] ; Anxiety[ ]   Heme: Bleeding problems [ ] ; Clotting disorders [ ] ; Anemia [ ]   Endocrine: Diabetes [Y ]; Thyroid dysfunction[ ]    Past Medical History:  Diagnosis Date  . CHF (congestive heart failure) (HCC)   . Diabetes mellitus without complication (HCC)   . Hypertension     Current Outpatient Prescriptions  Medication Sig Dispense Refill  . acetaminophen (TYLENOL) 500 MG tablet Take 500 mg by mouth every 6 (six) hours as needed for moderate pain.    Marland Kitchen aspirin EC 81 MG tablet Take 81 mg by mouth daily.    . carvedilol (COREG) 12.5 MG tablet Take 1 tablet (12.5 mg total) by mouth 2 (two) times daily with a meal. 30 tablet 1  . furosemide (LASIX) 20 MG tablet Take 20 mg by mouth as needed  for fluid.    Marland Kitchen lisinopril (PRINIVIL,ZESTRIL) 20 MG tablet Take 1 tablet (20 mg total) by mouth daily. 30 tablet 1  . metFORMIN (GLUCOPHAGE) 500 MG tablet Take 1 tablet (500 mg total) by mouth 2 (two) times daily with a meal. 60 tablet 1  . Multiple Vitamins-Minerals (MULTIVITAMIN WITH MINERALS) tablet Take 1 tablet by mouth daily.    . promethazine (PHENERGAN) 25 MG tablet Take 1 tablet (25 mg total) by mouth every 6 (six) hours as needed for nausea or vomiting. (Patient not taking: Reported on 07/21/2016) 12 tablet 0   No current facility-administered medications for this encounter.     Allergies  Allergen Reactions  . Aldactone [Spironolactone] Shortness Of Breath    SOB, vomiting, flushing  . Latex     Rash       Social History   Social History  . Marital status:  Single    Spouse name: N/A  . Number of children: N/A  . Years of education: N/A   Occupational History  . Not on file.   Social History Main Topics  . Smoking status: Former Games developer  . Smokeless tobacco: Never Used  . Alcohol use Yes     Comment: rarely  . Drug use: No  . Sexual activity: Not on file   Other Topics Concern  . Not on file   Social History Narrative  . No narrative on file     No family history on file.  Vitals:   07/21/16 1152  BP: (!) 144/102  Pulse: 91  SpO2: 99%  Weight: 243 lb (110.2 kg)  Height: 5\' 6"  (1.676 m)    PHYSICAL EXAM: General:  Well appearing. No respiratory difficulty. Ambulate in the clinic without difficulty.  HEENT: normal Neck: supple.JVD 7-8. Carotids 2+ bilat; no bruits. No lymphadenopathy or thryomegaly appreciated. Cor: PMI nondisplaced. Regular rate & rhythm. No rubs, gallops or murmurs. Lungs: clear Abdomen: obese, soft, nontender, nondistended. No hepatosplenomegaly. No bruits or masses. Good bowel sounds. Extremities: no cyanosis, clubbing, rash, trace edema Neuro: alert & oriented x 3, cranial nerves grossly intact. moves all 4 extremities w/o difficulty. Affect pleasant.  ECG: Sinus Rhythm Bigeminy 92 bpm   ASSESSMENT & PLAN: 1. 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 Viral but today she is having frequent PVCs so this may be component as well.  NYHA II-III. Seems well compensated.  Volume status stable. Continue lasix as needed. No spiro due to allergy.  Continue carvedilol 12.5 mg twice a day. Stop lisinopril due to cough. Start entresto 49-51 mg twice a day.  Set up ECHO this week.  2. 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 EP to evaluate on Thursday. ? Ablation. BMET, TSH, Mag today 3. St Jude ICD- ERI - Referred to EP this week.   4. HTN- continue current regimen. As above add entresto  5. Current Smoker- discussed smoking cessation.   Follow up in  2 weeks with Dr Gala Romney .   Repeat BMET at that time.   Amy Clegg NP_C  4:47 PM

## 2016-07-21 NOTE — Patient Instructions (Addendum)
Routine lab work today. Will notify you of abnormal results, otherwise no news is good news!  STOP Lisinopril.  START Entresto 49-51 mg tablet: Take 1 tab twice daily. Start this TOMORROW morning.  Will schedule you for follow up with Kern Alberta NP with echocardiogram at [facility] office. Address: [address] ([address] Floor), [place], [place] [address]  Phone: [phone]  Follow up 2-3 weeks with Dr. Gala Romney.  Do the following things EVERYDAY: 1) Weigh yourself in the morning before breakfast. Write it down and keep it in a log. 2) Take your medicines as prescribed 3) Eat low salt foods-Limit salt (sodium) to 2000 mg per day.  4) Stay as active as you can everyday 5) Limit all fluids for the day to less than 2 liters

## 2016-07-22 ENCOUNTER — Telehealth (HOSPITAL_COMMUNITY): Payer: Self-pay | Admitting: Cardiology

## 2016-07-22 MED ORDER — POTASSIUM CHLORIDE CRYS ER 20 MEQ PO TBCR: 40.0000 meq | EXTENDED_RELEASE_TABLET | Freq: Every day | ORAL | 3 refills | Status: DC

## 2016-07-22 MED ORDER — MAGNESIUM OXIDE -MG SUPPLEMENT 200 MG PO TABS: 200.0000 mg | ORAL_TABLET | Freq: Two times a day (BID) | ORAL | 6 refills | Status: AC

## 2016-07-22 NOTE — Progress Notes (Deleted)
Electrophysiology Office Note Date: 07/22/2016  ID:  Kelly [name], DOB 26-May-1986, MRN [id number]  PCP: Pcp Not In System Primary Cardiologist: Bensimhon (previously Kelly [name] at [facility] in [place], [place]) Electrophysiologist: Allred (to establish)  CC: to establish for ICD follow up  Kelly [name] is a 31 y.o. female seen today for Kelly [name].  She presents today to establish for ICD followup. She relocated to [facility] a year ago and has established with the HF clinic. She underwent ICD implant *** for NICM and chronic systolic heart failure.  Other history is significant for diabetes and hypertension.  She denies chest pain, palpitations, dyspnea, PND, orthopnea, nausea, vomiting, dizziness, syncope, edema, weight gain, or early satiety.  She has not had ICD shocks.   Device History: STJ dual chamber ICD implanted *** for NICM, CHF History of appropriate therapy: No History of AAD therapy: No   Past Medical History:  Diagnosis Date  . CHF (congestive heart failure) (HCC)   . Diabetes mellitus without complication (HCC)   . Hypertension   . ICD (implantable cardioverter-defibrillator) in place    2009 St Jude   Past Surgical History:  Procedure Laterality Date  . CARDIAC DEFIBRILLATOR PLACEMENT      Current Outpatient Prescriptions  Medication Sig Dispense Refill  . acetaminophen (TYLENOL) 500 MG tablet Take 500 mg by mouth every 6 (six) hours as needed for moderate pain.    Marland Kitchen aspirin EC 81 MG tablet Take 81 mg by mouth daily.    . carvedilol (COREG) 12.5 MG tablet Take 1 tablet (12.5 mg total) by mouth 2 (two) times daily with a meal. 30 tablet 1  . furosemide (LASIX) 20 MG tablet Take 20 mg by mouth as needed for fluid.    . Magnesium Oxide 200 MG TABS Take 1 tablet (200 mg total) by mouth 2 (two) times daily. 60 tablet 6  . metFORMIN (GLUCOPHAGE) 500 MG tablet Take 1 tablet (500 mg total) by mouth 2 (two) times daily with a meal. 60 tablet 1  . Multiple  Vitamins-Minerals (MULTIVITAMIN WITH MINERALS) tablet Take 1 tablet by mouth daily.    . potassium chloride SA (K-DUR,KLOR-CON) 20 MEQ tablet Take 2 tablets (40 mEq total) by mouth daily. 180 tablet 3  . promethazine (PHENERGAN) 25 MG tablet Take 1 tablet (25 mg total) by mouth every 6 (six) hours as needed for nausea or vomiting. (Patient not taking: Reported on 07/21/2016) 12 tablet 0  . sacubitril-valsartan (ENTRESTO) 49-51 MG Take 1 tablet by mouth 2 (two) times daily. 60 tablet 3   No current facility-administered medications for this visit.     Allergies:   Aldactone [spironolactone] and Latex   Social History: Social History   Social History  . Marital status: Single    Spouse name: N/A  . Number of children: N/A  . Years of education: N/A   Occupational History  . Not on file.   Social History Main Topics  . Smoking status: Current Every Day Smoker    Packs/day: 0.25    Types: Cigarettes  . Smokeless tobacco: Never Used  . Alcohol use No     Comment: rarely  . Drug use: No  . Sexual activity: Yes    Birth control/ protection: Condom   Other Topics Concern  . Not on file   Social History Narrative  . No narrative on file    Family History: No family history on file.  Review of Systems: General: No chills, fever, night sweats  or weight changes  Cardiovascular:  No chest pain, dyspnea on exertion, edema, orthopnea, palpitations, paroxysmal nocturnal dyspnea; denies ICD shocks Dermatological: No rash, lesions or masses Respiratory: No cough, dyspnea Urologic: No hematuria, dysuria Abdominal: No nausea, vomiting, diarrhea, bright red blood per rectum, melena, or hematemesis Neurologic: No visual changes, weakness, changes in mental status All other systems reviewed and are otherwise negative except as noted above.   Physical Exam: VS:  LMP 06/23/2016  , BMI There is no height or weight on file to calculate BMI.  GEN- The patient is well appearing, alert and  oriented x 3 today.   HEENT: normocephalic, atraumatic; sclera clear, conjunctiva pink; hearing intact; oropharynx clear; neck supple, no JVP Lymph- no cervical lymphadenopathy Lungs- Clear to ausculation bilaterally, normal work of breathing.  No wheezes, rales, rhonchi Heart- Regular rate and rhythm, no murmurs, rubs or gallops, PMI not laterally displaced GI- soft, non-tender, non-distended, bowel sounds present, no hepatosplenomegaly Extremities- no clubbing, cyanosis, or edema; DP/PT/radial pulses 2+ bilaterally MS- no significant deformity or atrophy Skin- warm and dry, no rash or lesion; ICD pocket well healed Psych- euthymic mood, full affect Neuro- strength and sensation are intact  ICD interrogation- reviewed in detail today,  See PACEART report  EKG:  EKG is not ordered today. EKG from 07/21/16 reviewed with bigeminal PVC's  Recent Labs: 09/25/2015: ALT 47 07/12/2016: B Natriuretic Peptide 98.5; Hemoglobin 13.4; Platelets 251 07/21/2016: BUN <5; Creatinine, Ser 0.69; Magnesium 1.6; Potassium 3.5; Sodium 138; TSH 1.433   Wt Readings from Last 3 Encounters:  07/21/16 243 lb (110.2 kg)  01/14/16 251 lb 9 oz (114.1 kg)  09/25/15 256 lb 11.2 oz (116.4 kg)     Other studies Reviewed: Additional studies/ records that were reviewed today include: AHF office notes  Assessment and Plan:  1.  Chronic systolic dysfunction euvolemic today Stable on an appropriate medical regimen Normal ICD function See Pace Art report No changes today  2.  PVC's Burden by device interrogation *** Increase Coreg today  3.  HTN Stable No change required today  4.  Obesity There is no height or weight on file to calculate BMI. She has recently lost 40 pounds. Continued weight loss encouraged  5.  Tobacco abuse Cessation advised    Current medicines are reviewed at length with the patient today.   The patient {ACTIONS; HAS/DOES NOT HAVE:19233} concerns regarding her medicines.  The  following changes were made today:  {NONE DEFAULTED:18576::"none"}  Labs/ tests ordered today include: *** No orders of the defined types were placed in this encounter.    Disposition:   Follow up with *** {gen number [id number]} {TIME; UNITS DAY/WEEK/MONTH:19136}   Signed, Gypsy Balsam, NP 07/22/2016 5:49 PM  [facility] [address] [place] [address] [phone] (office) [phone] (fax)

## 2016-07-22 NOTE — Telephone Encounter (Signed)
-----   Message from Sherald Hess, NP sent at 07/21/2016  4:54 PM EST ----- Please call potassium and magnesium low. Add 40 meq potassium daily. Add mag ox 200 mg twice a day.

## 2016-07-22 NOTE — Telephone Encounter (Signed)
Pt aware.

## 2016-07-23 ENCOUNTER — Ambulatory Visit: Payer: PRIVATE HEALTH INSURANCE | Admitting: Nurse Practitioner

## 2016-07-23 ENCOUNTER — Other Ambulatory Visit (HOSPITAL_COMMUNITY): Payer: PRIVATE HEALTH INSURANCE

## 2016-07-27 ENCOUNTER — Telehealth (HOSPITAL_COMMUNITY): Payer: Self-pay | Admitting: Vascular Surgery

## 2016-07-27 NOTE — Telephone Encounter (Signed)
Left pt message to [facility] appt w/ db ok to over book per [employer]

## 2016-07-28 ENCOUNTER — Encounter: Payer: Self-pay | Admitting: Nurse Practitioner

## 2016-08-05 ENCOUNTER — Telehealth (HOSPITAL_COMMUNITY): Payer: Self-pay | Admitting: Radiology

## 2016-08-05 NOTE — CV Procedure (Signed)
eft message due to inclement weather office will be opening on a delay. We call to reschedule 

## 2016-08-05 NOTE — Telephone Encounter (Signed)
eft message due to inclement weather office will be opening on a delay. We call to reschedule

## 2016-08-06 ENCOUNTER — Other Ambulatory Visit (HOSPITAL_COMMUNITY): Payer: Self-pay

## 2016-08-07 ENCOUNTER — Encounter (HOSPITAL_COMMUNITY): Payer: PRIVATE HEALTH INSURANCE | Admitting: Internal Medicine

## 2016-08-08 ENCOUNTER — Encounter (HOSPITAL_COMMUNITY): Payer: Self-pay | Admitting: Emergency Medicine

## 2016-08-08 ENCOUNTER — Emergency Department (HOSPITAL_COMMUNITY)
Admission: EM | Admit: 2016-08-08 | Discharge: 2016-08-08 | Disposition: A | Discharge status: Home / Self Care | Payer: PRIVATE HEALTH INSURANCE | Attending: Emergency Medicine | Admitting: Emergency Medicine

## 2016-08-08 DIAGNOSIS — Z91040 Latex allergy status: Secondary | ICD-10-CM | POA: Diagnosis not present

## 2016-08-08 DIAGNOSIS — I110 Hypertensive heart disease with heart failure: Secondary | ICD-10-CM | POA: Diagnosis not present

## 2016-08-08 DIAGNOSIS — T22212A Burn of second degree of left forearm, initial encounter: Secondary | ICD-10-CM | POA: Diagnosis not present

## 2016-08-08 DIAGNOSIS — Z79899 Other long term (current) drug therapy: Secondary | ICD-10-CM | POA: Diagnosis not present

## 2016-08-08 DIAGNOSIS — Z7984 Long term (current) use of oral hypoglycemic drugs: Secondary | ICD-10-CM | POA: Diagnosis not present

## 2016-08-08 DIAGNOSIS — I509 Heart failure, unspecified: Secondary | ICD-10-CM | POA: Insufficient documentation

## 2016-08-08 DIAGNOSIS — X100XXA Contact with hot drinks, initial encounter: Secondary | ICD-10-CM | POA: Insufficient documentation

## 2016-08-08 DIAGNOSIS — Z7982 Long term (current) use of aspirin: Secondary | ICD-10-CM | POA: Diagnosis not present

## 2016-08-08 DIAGNOSIS — E119 Type 2 diabetes mellitus without complications: Secondary | ICD-10-CM | POA: Insufficient documentation

## 2016-08-08 DIAGNOSIS — Y929 Unspecified place or not applicable: Secondary | ICD-10-CM | POA: Insufficient documentation

## 2016-08-08 DIAGNOSIS — T22012A Burn of unspecified degree of left forearm, initial encounter: Secondary | ICD-10-CM | POA: Diagnosis present

## 2016-08-08 DIAGNOSIS — F17210 Nicotine dependence, cigarettes, uncomplicated: Secondary | ICD-10-CM | POA: Insufficient documentation

## 2016-08-08 DIAGNOSIS — Y939 Activity, unspecified: Secondary | ICD-10-CM | POA: Insufficient documentation

## 2016-08-08 DIAGNOSIS — Z95810 Presence of automatic (implantable) cardiac defibrillator: Secondary | ICD-10-CM | POA: Diagnosis not present

## 2016-08-08 DIAGNOSIS — Y999 Unspecified external cause status: Secondary | ICD-10-CM | POA: Insufficient documentation

## 2016-08-08 MED ORDER — SILVER SULFADIAZINE 1 % EX CREA
TOPICAL_CREAM | Freq: Once | CUTANEOUS | Status: AC
  Administered 2016-08-08: 07:00:00 via TOPICAL
  Filled 2016-08-08: qty 50

## 2016-08-08 MED ORDER — SILVER SULFADIAZINE 1 % EX CREA: 1.0000 "application " | TOPICAL_CREAM | Freq: Every day | CUTANEOUS | 0 refills | Status: DC

## 2016-08-08 MED ORDER — IBUPROFEN 200 MG PO TABS
400.0000 mg | ORAL_TABLET | Freq: Once | ORAL | Status: AC
  Administered 2016-08-08: 400 mg via ORAL
  Filled 2016-08-08: qty 2
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
## 2016-08-08 NOTE — ED Notes (Signed)
SILVER CREAM GIVEN TO PT AT SDISCHARGE

## 2016-08-08 NOTE — Discharge Instructions (Signed)
Change the dressing and apply Silvadene Cream once a day. Take ibuprofen as needed for pain.

## 2016-08-11 ENCOUNTER — Ambulatory Visit (HOSPITAL_COMMUNITY): Payer: PRIVATE HEALTH INSURANCE

## 2016-08-13 ENCOUNTER — Ambulatory Visit (HOSPITAL_COMMUNITY)
Admission: RE | Admit: 2016-08-13 | Discharge: 2016-08-13 | Disposition: A | Discharge status: Home / Self Care | Payer: PRIVATE HEALTH INSURANCE | Source: Ambulatory Visit | Attending: Adult Health | Admitting: Adult Health

## 2016-08-13 DIAGNOSIS — I351 Nonrheumatic aortic (valve) insufficiency: Secondary | ICD-10-CM | POA: Insufficient documentation

## 2016-08-13 DIAGNOSIS — I5022 Chronic systolic (congestive) heart failure: Secondary | ICD-10-CM | POA: Insufficient documentation

## 2016-08-13 DIAGNOSIS — I340 Nonrheumatic mitral (valve) insufficiency: Secondary | ICD-10-CM | POA: Diagnosis not present

## 2016-08-13 NOTE — Progress Notes (Signed)
  Echocardiogram 2D Echocardiogram has been performed.  Arvil Chaco 08/13/2016, 11:32 AM

## 2016-08-19 ENCOUNTER — Telehealth (HOSPITAL_COMMUNITY): Payer: Self-pay | Admitting: Vascular Surgery

## 2016-08-19 ENCOUNTER — Encounter (HOSPITAL_COMMUNITY): Payer: Self-pay | Admitting: Internal Medicine

## 2016-08-19 ENCOUNTER — Ambulatory Visit (HOSPITAL_COMMUNITY)
Admission: RE | Admit: 2016-08-19 | Discharge: 2016-08-19 | Disposition: A | Discharge status: Home / Self Care | Payer: PRIVATE HEALTH INSURANCE | Source: Ambulatory Visit | Attending: Internal Medicine | Admitting: Internal Medicine

## 2016-08-19 VITALS — BP 132/68 | HR 64 | Wt 241.5 lb

## 2016-08-19 DIAGNOSIS — I493 Ventricular premature depolarization: Secondary | ICD-10-CM

## 2016-08-19 DIAGNOSIS — Z79899 Other long term (current) drug therapy: Secondary | ICD-10-CM | POA: Insufficient documentation

## 2016-08-19 DIAGNOSIS — F17210 Nicotine dependence, cigarettes, uncomplicated: Secondary | ICD-10-CM

## 2016-08-19 DIAGNOSIS — E119 Type 2 diabetes mellitus without complications: Secondary | ICD-10-CM | POA: Insufficient documentation

## 2016-08-19 DIAGNOSIS — I110 Hypertensive heart disease with heart failure: Secondary | ICD-10-CM | POA: Insufficient documentation

## 2016-08-19 DIAGNOSIS — I5022 Chronic systolic (congestive) heart failure: Secondary | ICD-10-CM

## 2016-08-19 DIAGNOSIS — Z7984 Long term (current) use of oral hypoglycemic drugs: Secondary | ICD-10-CM | POA: Insufficient documentation

## 2016-08-19 DIAGNOSIS — Z95810 Presence of automatic (implantable) cardiac defibrillator: Secondary | ICD-10-CM | POA: Insufficient documentation

## 2016-08-19 DIAGNOSIS — Z7982 Long term (current) use of aspirin: Secondary | ICD-10-CM | POA: Insufficient documentation

## 2016-08-19 DIAGNOSIS — Z5189 Encounter for other specified aftercare: Secondary | ICD-10-CM | POA: Insufficient documentation

## 2016-08-19 DIAGNOSIS — E876 Hypokalemia: Secondary | ICD-10-CM | POA: Insufficient documentation

## 2016-08-19 MED ORDER — SACUBITRIL-VALSARTAN 97-103 MG PO TABS: 1.0000 | ORAL_TABLET | Freq: Two times a day (BID) | ORAL | 3 refills | Status: DC

## 2016-08-19 MED ORDER — POTASSIUM CHLORIDE CRYS ER 20 MEQ PO TBCR: EXTENDED_RELEASE_TABLET | ORAL | 3 refills | Status: DC

## 2016-08-19 NOTE — Progress Notes (Signed)
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 Prior to relocation she was followed by Dr Jeannett Senior at [facility] in [place]. She has a history of chronic systolic heart failure diagnosed 2005, St Jude ICD, LHC 2005 no blockages, HTN, and DMII.   Recently evaluated [facility] ED 07/12/16 with CP. Troponin was negative and she was not thought to be overloaded. St Jude device interrogation showed frequent PVCs and ERI. She was discharged with follow up to the HF clinic    Two weeks ago saw Amy Clegg as a new patient at the request of Dr Lynelle Doctor for chronic systolic heart failure. Relocated to [place] over a year ago but has not established cardiology care.  At that visit lisinopril stopped and switched to [facility]. Also found to have frequent PVCs and ICD at [facility]. Referred to EP for generator change and possible PVC ablation.  Overall feeling great. Working 12 hour shifts at [employer]. Says she feels much better on Entresto. Dizziness and cough resolved. No orthopnea, PND or edema. Taking lasix about about twice a week. Denies PND. Smokes 5 cigarettes per day.   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 or smoke FH: No family history of CAD    Review of Systems: [y] = yes, [ ]  = no   General: Weight gain [ ] ; Weight loss [ Y]; Anorexia [ ] ; Fatigue [ ] ; Fever [ ] ; Chills [ ] ; Weakness [ ]   Cardiac: Chest pain/pressure [ ] ; Resting SOB [ ] ; Exertional SOB [Y ]; Orthopnea [ ] ; Pedal Edema [ ] ; Palpitations [ ] ; Syncope [ ] ; Presyncope [ ] ; Paroxysmal nocturnal dyspnea[ ]   Pulmonary: Cough Gilian.Kraft ]; Wheezing[ ] ; Hemoptysis[ ] ; Sputum [ ] ; Snoring [ ]   GI: Vomiting[ ] ; Dysphagia[ ] ; Melena[ ] ; Hematochezia [ ] ; Heartburn[ ] ; Abdominal pain [ ] ; Constipation [ ] ; Diarrhea [ ] ; BRBPR [ ]     GU: Hematuria[ ] ; Dysuria [ ] ; Nocturia[ ]   Vascular: Pain in legs with walking [ ] ; Pain in feet with lying flat [ ] ; Non-healing sores [ ] ; Stroke [ ] ; TIA [ ] ; Slurred speech [ ] ;  Neuro: Headaches[ ] ; Vertigo[ ] ; Seizures[ ] ; Paresthesias[ ] ;Blurred vision [ ] ; Diplopia [ ] ; Vision changes [ ]   Ortho/Skin: Arthritis [ ] ; Joint pain [ ] ; Muscle pain [ ] ; Joint swelling [ ] ; Back Pain [Y ]; Rash [ ]   Psych: Depression[ ] ; Anxiety[ ]   Heme: Bleeding problems [ ] ; Clotting disorders [ ] ; Anemia [ ]   Endocrine: Diabetes [Y ]; Thyroid dysfunction[ ]    Past Medical History:  Diagnosis Date  . CHF (congestive heart failure) (HCC)   . Diabetes mellitus without complication (HCC)   . Hypertension   . 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 acetaminophen (TYLENOL) 500 MG tablet Take 500 mg by mouth every 6 (six) hours as needed for moderate pain.    Marland Kitchen aspirin EC 81 MG tablet Take 81 mg by mouth daily.    . carvedilol (COREG) 12.5 MG tablet Take 1 tablet (12.5 mg total) by mouth 2 (two) times daily with a meal. 30 tablet 1  . Magnesium Oxide 200 MG TABS Take  1 tablet (200 mg total) by mouth 2 (two) times daily. 60 tablet 6  . metFORMIN (GLUCOPHAGE) 500 MG tablet Take 1 tablet (500 mg total) by mouth 2 (two) times daily with a meal. 60 tablet 1  . Multiple Vitamins-Minerals (MULTIVITAMIN WITH MINERALS) tablet Take 1 tablet by mouth daily.    . potassium chloride SA (K-DUR,KLOR-CON) 20 MEQ tablet Take 2 tablets (40 mEq total) by mouth daily. 180 tablet 3  . sacubitril-valsartan (ENTRESTO) 49-51 MG Take 1 tablet by mouth 2 (two) times daily. 60 tablet 3  . silver sulfADIAZINE (SILVADENE) 1 % cream Apply 1 application topically daily. 50 g 0  . furosemide (LASIX) 20 MG tablet Take 20 mg by mouth as needed for fluid.     No current facility-administered medications for this encounter.      Allergies  Allergen Reactions  . Aldactone [Spironolactone] Shortness Of Breath    SOB, vomiting, flushing  . Latex     Rash       Social History   Social History  . Marital status: Single    Spouse name: N/A  . Number of children: N/A  . Years of education: N/A   Occupational History  . Not on file.   Social History Main Topics  . Smoking status: Current Every Day Smoker    Packs/day: 0.25    Types: Cigarettes  . Smokeless tobacco: Never Used  . Alcohol use No     Comment: rarely  . Drug use: No  . Sexual activity: Yes    Birth control/ protection: Condom   Other Topics Concern  . Not on file   Social History Narrative  . No narrative on file     No family history on file.  Vitals:   08/19/16 0935  BP: 132/68  Pulse: 64  SpO2: 100%  Weight: 241 lb 8 oz (109.5 kg)    PHYSICAL EXAM: General:  Well appearing. No respiratory difficulty. Ambulate in the clinic without difficulty.  HEENT: normal Neck: supple.JVD 7-8. Carotids 2+ bilat; no bruits. No lymphadenopathy or thryomegaly appreciated. Cor: PMI nondisplaced. Regular rate & rhythm. No rubs, gallops or murmurs. Lungs: clear Abdomen: obese, soft, nontender, nondistended. No hepatosplenomegaly. No bruits or masses. Good bowel sounds. Extremities: no cyanosis, clubbing, rash, trace edema Neuro: alert & oriented x 3, cranial nerves grossly intact. moves all 4 extremities w/o difficulty. Affect pleasant.  ECG: Sinus Rhythm Bigeminy 92 bpm   ASSESSMENT & PLAN: 1. 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 Viral but today she is having frequent PVCs so this may be component as well.  NYHA II Seems well compensated.  Volume status stable. Continue lasix as needed. No spiro due to allergy.  Continue carvedilol 12.5 mg twice a day. Increase entresto 97-103 mg twice a day.  Echo reviewed personally EF 15-20% RV normal Will need CPX at some point if getting worse 2. 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 EP to evaluate on Thursday. ? Ablation vs amiodarone. BMET, TSH, Mag today 3. St Jude ICD- ERI - Referred to EP. Had appt but she missed it. Will reschedule  4. HTN- continue current regimen.Increasing entresto as above 5. Current Smoker- discussed smoking cessation.  6. Hypokalemia - Increase from 20 bid to 40/20   Arvilla Meres MD 9:46 AM

## 2016-08-19 NOTE — Addendum Note (Signed)
Encounter addended by: Noralee Space, RN on: 08/19/2016 10:06 AM<BR>    Actions taken: Order list changed, Sign clinical note

## 2016-08-19 NOTE — Telephone Encounter (Signed)
Left pt detailed message about appt w/ Amber 09/09/16 @ 8 am

## 2016-08-19 NOTE — Patient Instructions (Signed)
Increase Potassium to 2 tabs in AM and 1 tab in PM  Increase Entresto to 97/103 mg Twice daily   Your physician recommends that you schedule a follow-up appointment in: 3 months

## 2016-08-26 ENCOUNTER — Encounter: Payer: Self-pay | Admitting: Nurse Practitioner

## 2016-08-26 ENCOUNTER — Ambulatory Visit (INDEPENDENT_AMBULATORY_CARE_PROVIDER_SITE_OTHER): Payer: PRIVATE HEALTH INSURANCE | Admitting: Nurse Practitioner

## 2016-08-26 VITALS — BP 120/66 | HR 46 | Ht 66.0 in | Wt 242.0 lb

## 2016-08-26 DIAGNOSIS — Z01812 Encounter for preprocedural laboratory examination: Secondary | ICD-10-CM | POA: Diagnosis not present

## 2016-08-26 DIAGNOSIS — I493 Ventricular premature depolarization: Secondary | ICD-10-CM | POA: Diagnosis not present

## 2016-08-26 DIAGNOSIS — I5022 Chronic systolic (congestive) heart failure: Secondary | ICD-10-CM | POA: Diagnosis not present

## 2016-08-26 DIAGNOSIS — I10 Essential (primary) hypertension: Secondary | ICD-10-CM | POA: Diagnosis not present

## 2016-08-26 LAB — BASIC METABOLIC PANEL
BUN/Creatinine Ratio: 12 (ref 9–23)
BUN: 8 mg/dL (ref 6–20)
CALCIUM: 8.9 mg/dL (ref 8.7–10.2)
CHLORIDE: 102 mmol/L (ref 96–106)
CO2: 19 mmol/L (ref 18–29)
Creatinine, Ser: 0.65 mg/dL (ref 0.57–1.00)
GFR calc Af Amer: 136 mL/min/{1.73_m2} (ref >59)
GFR calc non Af Amer: 118 mL/min/{1.73_m2} (ref >59)
GLUCOSE: 137 mg/dL — AB (ref 65–99)
Potassium: 4.1 mmol/L (ref 3.5–5.2)
Sodium: 137 mmol/L (ref 134–144)

## 2016-08-26 LAB — CBC WITH DIFFERENTIAL/PLATELET
BASOS ABS: 0 10*3/uL (ref 0.0–0.2)
Basos: 0 %
EOS (ABSOLUTE): 0.2 10*3/uL (ref 0.0–0.4)
Eos: 2 %
HEMOGLOBIN: 13.7 g/dL (ref 11.1–15.9)
Hematocrit: 41 % (ref 34.0–46.6)
IMMATURE GRANULOCYTES: 0 %
Immature Grans (Abs): 0 10*3/uL (ref 0.0–0.1)
LYMPHS ABS: 2.9 10*3/uL (ref 0.7–3.1)
LYMPHS: 24 %
MCH: 29.1 pg (ref 26.6–33.0)
MCHC: 33.4 g/dL (ref 31.5–35.7)
MCV: 87 fL (ref 79–97)
MONOCYTES: 9 %
Monocytes Absolute: 1 10*3/uL — ABNORMAL HIGH (ref 0.1–0.9)
NEUTROS PCT: 65 %
Neutrophils Absolute: 8 10*3/uL — ABNORMAL HIGH (ref 1.4–7.0)
Platelets: 267 10*3/uL (ref 150–379)
RBC: 4.7 x10E6/uL (ref 3.77–5.28)
RDW: 13.8 % (ref 12.3–15.4)
WBC: 12.2 10*3/uL — AB (ref 3.4–10.8)

## 2016-08-26 LAB — PROTIME-INR
INR: 1 (ref 0.8–1.2)
PROTHROMBIN TIME: 10.5 s (ref 9.1–12.0)

## 2016-08-26 NOTE — Patient Instructions (Addendum)
Medication Instructions:  Your physician recommends that you continue on your current medications as directed. Please refer to the Current Medication list given to you today.   Labwork: Bmet, Cbc, Pt/Inr today  Testing/Procedures: Your physician has recommended that you have a generator change. This is done in the hospital and usually requires an overnight stay. Please see the instruction sheet given to you today for more information.   Follow-Up: Your physician recommends that you schedule a follow-up appointment on 09/10/16 2 9am for a wound check  Your physician recommends that you schedule a follow-up appointment on 09/24/16 @ 8;20am Amber Seiler,NP    Any Other Special Instructions Will Be Listed Below (If Applicable).     If you need a refill on your cardiac medications before your next appointment, please call your pharmacy.

## 2016-08-26 NOTE — Progress Notes (Signed)
Electrophysiology Office Note Date: 08/26/2016  ID:  Kelly [name], DOB March 12, 1986, MRN [id number]  PCP: Pcp Not In System Primary Cardiologist: Bensimhon Electrophysiologist: Allred (to establish)  CC: to establish ICD follow up  Kelly [name] is a 31 y.o. female seen today for [name].  She presents today to establish ICD follow up.  She relocated to [place] about a year ago from [place], [place]. 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  ICD was implanted for secondary prevention.  She has established with the AHF clinic and had medications optimized.  Other past medical history is notable for hypertension and diabetes. Since last being seen in AHF clinic, the patient reports doing reasonably well.  She continues with fatigue.  She also has palpitations.  She denies chest pain, dyspnea, PND, orthopnea, nausea, vomiting, dizziness, syncope, edema, weight gain, or early satiety.  She has not had ICD shocks.   Echo 07/2016 demonstrated EF 15-20%, mild MR, LA mildly to moderately dilated   Device History: STJ dual chamber ICD implanted 2009 for NICM, syncope History of appropriate therapy: No History of AAD therapy: No   Past Medical History:  Diagnosis Date  . CHF (congestive heart failure) (HCC)   . Diabetes mellitus without complication (HCC)   . Hypertension   . ICD (implantable cardioverter-defibrillator) in place    2009 St Jude   Past Surgical History:  Procedure Laterality Date  . CARDIAC DEFIBRILLATOR PLACEMENT      Current Outpatient Prescriptions  Medication Sig Dispense Refill  . acetaminophen (TYLENOL) 500 MG tablet Take 500 mg by mouth every 6 (six) hours as needed for moderate pain.    Marland Kitchen aspirin EC 81 MG tablet Take 81 mg by mouth daily.    . carvedilol (COREG) 12.5 MG tablet Take 1  tablet (12.5 mg total) by mouth 2 (two) times daily with a meal. 30 tablet 1  . furosemide (LASIX) 20 MG tablet Take 20 mg by mouth as needed for fluid.    . Magnesium Oxide 200 MG TABS Take 1 tablet (200 mg total) by mouth 2 (two) times daily. 60 tablet 6  . metFORMIN (GLUCOPHAGE) 500 MG tablet Take 1 tablet (500 mg total) by mouth 2 (two) times daily with a meal. 60 tablet 1  . Multiple Vitamins-Minerals (MULTIVITAMIN WITH MINERALS) tablet Take 1 tablet by mouth daily.    . potassium chloride SA (K-DUR,KLOR-CON) 20 MEQ tablet Take 2 tabs in AM and 1 tab in PM 270 tablet 3  . sacubitril-valsartan (ENTRESTO) 97-103 MG Take 1 tablet by mouth 2 (two) times daily. 180 tablet 3  . silver sulfADIAZINE (SILVADENE) 1 % cream Apply 1 application topically daily. 50 g 0   No current facility-administered medications for this visit.     Allergies:   Aldactone [spironolactone] and Latex   Social History: Social History   Social History  . Marital status: Single    Spouse name: N/A  . Number of children: N/A  . Years of education: N/A   Occupational History  . Not on file.   Social History Main Topics  . Smoking status: Current Every Day Smoker    Packs/day: 0.25    Types: Cigarettes  . Smokeless tobacco: Never Used  . Alcohol use No     Comment: rarely  . Drug  use: No  . Sexual activity: Yes    Birth control/ protection: Condom   Other Topics Concern  . Not on file   Social History Narrative  . No narrative on file    Family History: Grandmother with CAD and stents Cousin with SVT  Review of Systems: All other systems reviewed and are otherwise negative except as noted above.   Physical Exam: VS:  There were no vitals taken for this visit. , BMI There is no height or weight on file to calculate BMI.  GEN- The patient is obese appearing, alert and oriented x 3 today.   HEENT: normocephalic, atraumatic; sclera clear, conjunctiva pink; hearing intact; oropharynx clear; neck  supple  Lungs- Clear to ausculation bilaterally, normal work of breathing.  No wheezes, rales, rhonchi Heart- Regular rate and rhythm, frequent ectopy GI- soft, non-tender, non-distended, bowel sounds present  Extremities- no clubbing, cyanosis, or edema  MS- no significant deformity or atrophy Skin- warm and dry, no rash or lesion; ICD pocket well healed, multiple tattoos Psych- euthymic mood, full affect Neuro- strength and sensation are intact  ICD interrogation- reviewed in detail today,  See PACEART report  EKG:  EKG is not ordered today.  Recent Labs: 09/25/2015: ALT 47 07/12/2016: B Natriuretic Peptide 98.5; Hemoglobin 13.4; Platelets 251 07/21/2016: BUN <5; Creatinine, Ser 0.69; Magnesium 1.6; Potassium 3.5; Sodium 138; TSH 1.433   Wt Readings from Last 3 Encounters:  08/19/16 241 lb 8 oz (109.5 kg)  08/08/16 237 lb 6 oz (107.7 kg)  07/21/16 243 lb (110.2 kg)     Other studies Reviewed: Additional studies/ records that were reviewed today include: AHF notes  Assessment and Plan:  1.  Chronic systolic dysfunction euvolemic today Stable on an appropriate medical regimen Normal ICD function. She does have some intermittent noise on RA lead that is not reproducible today. Will evaluate more closely at time of gen change. As she does not use atrial lead, I would lean towards not replacing at time of gen change.  ICD at [facility] Risks, benefits to ICD generator change reviewed with the patient today who wishes to proceed. Will schedule with [name] at next available time.  See Arita Miss Art report Continue follow up in AHF clinic Enroll in [facility] clinic after gen change  2.  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  By device interrogation, these appear to be PAC's with aberrancy on some real time EGM's and others are clearly PVC's without antecedent A.  Morphology is similar on both beats. I am able to overdrive suppress by A pacing at 90.  QRS morphology is different than sinus on EGM's.  Will further evaluate at time of gen change. If these are found to be PVC's, I think that ablating them would likely improve LV function. We discussed briefly today Will follow up after gen change.   3.  HTN Stable No change required today   Current medicines are reviewed at length with the patient today.   The patient does not have concerns regarding her medicines.  The following changes were made today:  none  Labs/ tests ordered today include: pre-procedure labs No orders of the defined types were placed in this encounter.    Disposition:   Follow up with me 4 weeks after gen change     Signed, Gypsy Balsam, NP 08/26/2016 9:59 AM  [facility] [address] [place] [address] [phone] (office) [phone] (fax)

## 2016-09-03 ENCOUNTER — Encounter (HOSPITAL_COMMUNITY)
Admission: RE | Disposition: A | Discharge status: Home / Self Care | Payer: Self-pay | Source: Ambulatory Visit | Attending: Internal Medicine

## 2016-09-03 ENCOUNTER — Ambulatory Visit (HOSPITAL_COMMUNITY)
Admission: RE | Admit: 2016-09-03 | Discharge: 2016-09-03 | Disposition: A | Discharge status: Home / Self Care | Payer: PRIVATE HEALTH INSURANCE | Source: Ambulatory Visit | Attending: Internal Medicine | Admitting: Internal Medicine

## 2016-09-03 DIAGNOSIS — Z7982 Long term (current) use of aspirin: Secondary | ICD-10-CM | POA: Insufficient documentation

## 2016-09-03 DIAGNOSIS — I429 Cardiomyopathy, unspecified: Secondary | ICD-10-CM | POA: Diagnosis not present

## 2016-09-03 DIAGNOSIS — F17210 Nicotine dependence, cigarettes, uncomplicated: Secondary | ICD-10-CM | POA: Insufficient documentation

## 2016-09-03 DIAGNOSIS — Z8249 Family history of ischemic heart disease and other diseases of the circulatory system: Secondary | ICD-10-CM | POA: Diagnosis not present

## 2016-09-03 DIAGNOSIS — Z4502 Encounter for adjustment and management of automatic implantable cardiac defibrillator: Secondary | ICD-10-CM | POA: Diagnosis present

## 2016-09-03 DIAGNOSIS — I110 Hypertensive heart disease with heart failure: Secondary | ICD-10-CM | POA: Diagnosis not present

## 2016-09-03 DIAGNOSIS — Z7984 Long term (current) use of oral hypoglycemic drugs: Secondary | ICD-10-CM | POA: Diagnosis not present

## 2016-09-03 DIAGNOSIS — E119 Type 2 diabetes mellitus without complications: Secondary | ICD-10-CM | POA: Diagnosis not present

## 2016-09-03 DIAGNOSIS — I428 Other cardiomyopathies: Secondary | ICD-10-CM | POA: Diagnosis not present

## 2016-09-03 DIAGNOSIS — I5022 Chronic systolic (congestive) heart failure: Secondary | ICD-10-CM | POA: Insufficient documentation

## 2016-09-03 HISTORY — PX: ICD GENERATOR CHANGEOUT: EP1231

## 2016-09-03 LAB — SURGICAL PCR SCREEN
MRSA, PCR: NEGATIVE
Staphylococcus aureus: NEGATIVE

## 2016-09-03 LAB — GLUCOSE, CAPILLARY: GLUCOSE-CAPILLARY: 125 mg/dL — AB (ref 65–99)

## 2016-09-03 LAB — HCG, SERUM, QUALITATIVE: Preg, Serum: NEGATIVE

## 2016-09-03 SURGERY — ICD GENERATOR CHANGEOUT

## 2016-09-03 MED ORDER — SODIUM CHLORIDE 0.9% FLUSH: 3.0000 mL | INTRAVENOUS | Status: DC | PRN

## 2016-09-03 MED ORDER — SODIUM CHLORIDE 0.9% FLUSH: 3.0000 mL | Freq: Two times a day (BID) | INTRAVENOUS | Status: DC

## 2016-09-03 MED ORDER — SODIUM CHLORIDE 0.9 % IV SOLN: 250.0000 mL | INTRAVENOUS | Status: DC | PRN

## 2016-09-03 MED ORDER — SODIUM CHLORIDE 0.9 % IV SOLN
INTRAVENOUS | Status: DC
  Administered 2016-09-03: 13:00:00 via INTRAVENOUS

## 2016-09-03 MED ORDER — MUPIROCIN 2 % EX OINT
TOPICAL_OINTMENT | CUTANEOUS | Status: AC
  Administered 2016-09-03: 1 via TOPICAL
  Filled 2016-09-03: qty 22

## 2016-09-03 MED ORDER — SODIUM CHLORIDE 0.9 % IR SOLN
Status: AC
  Filled 2016-09-03: qty 2

## 2016-09-03 MED ORDER — LIDOCAINE HCL (PF) 1 % IJ SOLN
INTRAMUSCULAR | Status: AC
  Filled 2016-09-03: qty 30

## 2016-09-03 MED ORDER — DEXTROSE 5 % IV SOLN
3.0000 g | INTRAVENOUS | Status: AC
  Administered 2016-09-03: 3 g via INTRAVENOUS
  Filled 2016-09-03: qty 3000

## 2016-09-03 MED ORDER — SODIUM CHLORIDE 0.9 % IR SOLN
80.0000 mg | Status: AC
  Administered 2016-09-03: 80 mg
  Filled 2016-09-03: qty 2

## 2016-09-03 MED ORDER — MUPIROCIN 2 % EX OINT
1.0000 "application " | TOPICAL_OINTMENT | Freq: Once | CUTANEOUS | Status: AC
  Administered 2016-09-03: 1 via TOPICAL
  Filled 2016-09-03: qty 22

## 2016-09-03 MED ORDER — CHLORHEXIDINE GLUCONATE 4 % EX LIQD
60.0000 mL | Freq: Once | CUTANEOUS | Status: DC
  Filled 2016-09-03: qty 60

## 2016-09-03 MED ORDER — ONDANSETRON HCL 4 MG/2ML IJ SOLN: 4.0000 mg | Freq: Four times a day (QID) | INTRAMUSCULAR | Status: DC | PRN

## 2016-09-03 MED ORDER — ACETAMINOPHEN 325 MG PO TABS
325.0000 mg | ORAL_TABLET | ORAL | Status: DC | PRN
  Filled 2016-09-03: qty 2

## 2016-09-03 MED ORDER — LIDOCAINE HCL (PF) 1 % IJ SOLN
INTRAMUSCULAR | Status: DC | PRN
  Administered 2016-09-03: 40 mL

## 2016-09-03 SURGICAL SUPPLY — 4 items
CABLE SURGICAL S-101-97-12 (CABLE) ×2 IMPLANT
ICD ASSURA DR CD2357-40C (ICD Generator) ×2 IMPLANT
PAD DEFIB LIFELINK (PAD) ×2 IMPLANT
TRAY PACEMAKER INSERTION (PACKS) ×2 IMPLANT

## 2016-09-03 NOTE — Interval H&P Note (Signed)
History and Physical Interval Note:  09/03/2016 1:09 PM  Kelly [name]  has presented today for surgery, with the diagnosis of hf  The various methods of treatment have been discussed with the patient and family. After consideration of risks, benefits and other options for treatment, the patient has consented to  Procedure(s): ICD QUALCOMM (N/A) as a surgical intervention .  The patient's history has been reviewed, patient examined, no change in status, stable for surgery.  I have reviewed the patient's chart and labs.  Questions were answered to the patient's satisfaction.    Risks, benefits, and alternatives to ICD pulse generator replacement were discussed in detail today.  The patient understands that risks include but are not limited to bleeding, infection, pneumothorax, perforation, tamponade, vascular damage, renal failure, MI, stroke, death, inappropriate shocks, damage to his existing leads, and lead dislodgement and wishes to proceed at this time.   ICD Criteria  Current LVEF:15%. Within 12 months prior to implant: Yes   Heart failure history: Yes, Class II  Cardiomyopathy history: Yes, Non-Ischemic Cardiomyopathy.  Atrial Fibrillation/Atrial Flutter: No.  Ventricular tachycardia history: No.  Cardiac arrest history: No.  History of syndromes with risk of sudden death: No.  Previous ICD: Yes, Reason for ICD:  Primary prevention.  Current ICD indication: Primary  PPM indication: No.   Class I or II Bradycardia indication present: No  Beta Blocker therapy for 3 or more months: Yes, prescribed.   Ace Inhibitor/ARB therapy for 3 or more months: Yes, prescribed.      Hillis Range

## 2016-09-03 NOTE — H&P (View-Only) (Signed)
Electrophysiology Office Note Date: 08/26/2016  ID:  Kelly [name], DOB 1985-11-04, MRN [id number]  PCP: Pcp Not In System Primary Cardiologist: Bensimhon Electrophysiologist: Allred (to establish)  CC: to establish ICD follow up  Kelly [name] is a 31 y.o. female seen today for [name].  She presents today to establish ICD follow up.  She relocated to [place] about a year ago from [place], [place]. 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  ICD was implanted for secondary prevention.  She has established with the AHF clinic and had medications optimized.  Other past medical history is notable for hypertension and diabetes. Since last being seen in AHF clinic, the patient reports doing reasonably well.  She continues with fatigue.  She also has palpitations.  She denies chest pain, dyspnea, PND, orthopnea, nausea, vomiting, dizziness, syncope, edema, weight gain, or early satiety.  She has not had ICD shocks.   Echo 07/2016 demonstrated EF 15-20%, mild MR, LA mildly to moderately dilated   Device History: STJ dual chamber ICD implanted 2009 for NICM, syncope History of appropriate therapy: No History of AAD therapy: No   Past Medical History:  Diagnosis Date  . CHF (congestive heart failure) (HCC)   . Diabetes mellitus without complication (HCC)   . Hypertension   . ICD (implantable cardioverter-defibrillator) in place    2009 St Jude   Past Surgical History:  Procedure Laterality Date  . CARDIAC DEFIBRILLATOR PLACEMENT      Current Outpatient Prescriptions  Medication Sig Dispense Refill  . acetaminophen (TYLENOL) 500 MG tablet Take 500 mg by mouth every 6 (six) hours as needed for moderate pain.    Marland Kitchen aspirin EC 81 MG tablet Take 81 mg by mouth daily.    . carvedilol (COREG) 12.5 MG tablet Take 1  tablet (12.5 mg total) by mouth 2 (two) times daily with a meal. 30 tablet 1  . furosemide (LASIX) 20 MG tablet Take 20 mg by mouth as needed for fluid.    . Magnesium Oxide 200 MG TABS Take 1 tablet (200 mg total) by mouth 2 (two) times daily. 60 tablet 6  . metFORMIN (GLUCOPHAGE) 500 MG tablet Take 1 tablet (500 mg total) by mouth 2 (two) times daily with a meal. 60 tablet 1  . Multiple Vitamins-Minerals (MULTIVITAMIN WITH MINERALS) tablet Take 1 tablet by mouth daily.    . potassium chloride SA (K-DUR,KLOR-CON) 20 MEQ tablet Take 2 tabs in AM and 1 tab in PM 270 tablet 3  . sacubitril-valsartan (ENTRESTO) 97-103 MG Take 1 tablet by mouth 2 (two) times daily. 180 tablet 3  . silver sulfADIAZINE (SILVADENE) 1 % cream Apply 1 application topically daily. 50 g 0   No current facility-administered medications for this visit.     Allergies:   Aldactone [spironolactone] and Latex   Social History: Social History   Social History  . Marital status: Single    Spouse name: N/A  . Number of children: N/A  . Years of education: N/A   Occupational History  . Not on file.   Social History Main Topics  . Smoking status: Current Every Day Smoker    Packs/day: 0.25    Types: Cigarettes  . Smokeless tobacco: Never Used  . Alcohol use No     Comment: rarely  . Drug  use: No  . Sexual activity: Yes    Birth control/ protection: Condom   Other Topics Concern  . Not on file   Social History Narrative  . No narrative on file    Family History: Grandmother with CAD and stents Cousin with SVT  Review of Systems: All other systems reviewed and are otherwise negative except as noted above.   Physical Exam: VS:  There were no vitals taken for this visit. , BMI There is no height or weight on file to calculate BMI.  GEN- The patient is obese appearing, alert and oriented x 3 today.   HEENT: normocephalic, atraumatic; sclera clear, conjunctiva pink; hearing intact; oropharynx clear; neck  supple  Lungs- Clear to ausculation bilaterally, normal work of breathing.  No wheezes, rales, rhonchi Heart- Regular rate and rhythm, frequent ectopy GI- soft, non-tender, non-distended, bowel sounds present  Extremities- no clubbing, cyanosis, or edema  MS- no significant deformity or atrophy Skin- warm and dry, no rash or lesion; ICD pocket well healed, multiple tattoos Psych- euthymic mood, full affect Neuro- strength and sensation are intact  ICD interrogation- reviewed in detail today,  See PACEART report  EKG:  EKG is not ordered today.  Recent Labs: 09/25/2015: ALT 47 07/12/2016: B Natriuretic Peptide 98.5; Hemoglobin 13.4; Platelets 251 07/21/2016: BUN <5; Creatinine, Ser 0.69; Magnesium 1.6; Potassium 3.5; Sodium 138; TSH 1.433   Wt Readings from Last 3 Encounters:  08/19/16 241 lb 8 oz (109.5 kg)  08/08/16 237 lb 6 oz (107.7 kg)  07/21/16 243 lb (110.2 kg)     Other studies Reviewed: Additional studies/ records that were reviewed today include: AHF notes  Assessment and Plan:  1.  Chronic systolic dysfunction euvolemic today Stable on an appropriate medical regimen Normal ICD function. She does have some intermittent noise on RA lead that is not reproducible today. Will evaluate more closely at time of gen change. As she does not use atrial lead, I would lean towards not replacing at time of gen change.  ICD at [facility] Risks, benefits to ICD generator change reviewed with the patient today who wishes to proceed. Will schedule with [name] at next available time.  See Arita Miss Art report Continue follow up in AHF clinic Enroll in [facility] clinic after gen change  2.  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  By device interrogation, these appear to be PAC's with aberrancy on some real time EGM's and others are clearly PVC's without antecedent A.  Morphology is similar on both beats. I am able to overdrive suppress by A pacing at 90.  QRS morphology is different than sinus on EGM's.  Will further evaluate at time of gen change. If these are found to be PVC's, I think that ablating them would likely improve LV function. We discussed briefly today Will follow up after gen change.   3.  HTN Stable No change required today   Current medicines are reviewed at length with the patient today.   The patient does not have concerns regarding her medicines.  The following changes were made today:  none  Labs/ tests ordered today include: pre-procedure labs No orders of the defined types were placed in this encounter.    Disposition:   Follow up with me 4 weeks after gen change     Signed, Gypsy Balsam, NP 08/26/2016 9:59 AM  [facility] [address] [place] [address] [phone] (office) [phone] (fax)

## 2016-09-03 NOTE — Discharge Instructions (Signed)
Pacemaker Battery Change, Care After °Refer to this sheet in the next few weeks. These instructions provide you with information on caring for yourself after your procedure. Your health care provider may also give you more specific instructions. Your treatment has been planned according to current medical practices, but problems sometimes occur. Call your health care provider if you have any problems or questions after your procedure. °WHAT TO EXPECT AFTER THE PROCEDURE °After your procedure, it is typical to have the following sensations: °· Soreness at the pacemaker site. °HOME CARE INSTRUCTIONS  °· Keep the incision clean and dry. °· Unless advised otherwise, you may shower beginning 48 hours after your procedure. °· For the first week after the replacement, avoid stretching motions that pull at the incision site, and avoid heavy exercise with the arm that is on the same side as the incision. °· Take medicines only as directed by your health care provider. °· Keep all follow-up visits as directed by your health care provider. °SEEK MEDICAL CARE IF:  °· You have pain at the incision site that is not relieved by over-the-counter or prescription medicine. °· There is drainage or pus from the incision site. °· There is swelling larger than a lime at the incision site. °· You develop red streaking that extends above or below the incision site. °· You feel brief, intermittent palpitations, light-headedness, or any symptoms that you feel might be related to your heart. °SEEK IMMEDIATE MEDICAL CARE IF:  °· You experience chest pain that is different than the pain at the pacemaker site. °· You experience shortness of breath. °· You have palpitations or irregular heartbeat. °· You have light-headedness that does not go away quickly. °· You faint. °· You have pain that gets worse and is not relieved by medicine. °This information is not intended to replace advice given to you by your health care provider. Make sure you  discuss any questions you have with your health care provider. °Document Released: 04/26/2013 Document Revised: 07/27/2014 Document Reviewed: 04/26/2013 °Elsevier Interactive Patient Education © 2017 [employer] Inc[employer] ° °

## 2016-09-04 ENCOUNTER — Telehealth: Payer: Self-pay | Admitting: Internal Medicine

## 2016-09-04 ENCOUNTER — Encounter (HOSPITAL_COMMUNITY): Payer: Self-pay | Admitting: Internal Medicine

## 2016-09-04 NOTE — Telephone Encounter (Signed)
New Message     Pt has procedure 09/03/16 and is requesting pain medication, she said that the pain is at a level 6, and OTC meds are not working

## 2016-09-04 NOTE — Telephone Encounter (Signed)
Spoke with patient and she is some better than earlier today.  She will continue to alternate Tylenol and Ibuprofen for her pain.  I let her know we do not typically give pain medications after a generator change.  She was appreciative of my call

## 2016-09-09 ENCOUNTER — Ambulatory Visit: Payer: PRIVATE HEALTH INSURANCE | Admitting: Nurse Practitioner

## 2016-09-10 ENCOUNTER — Ambulatory Visit (INDEPENDENT_AMBULATORY_CARE_PROVIDER_SITE_OTHER): Payer: PRIVATE HEALTH INSURANCE | Admitting: *Deleted

## 2016-09-10 DIAGNOSIS — I5022 Chronic systolic (congestive) heart failure: Secondary | ICD-10-CM

## 2016-09-10 LAB — CUP PACEART INCLINIC DEVICE CHECK
Brady Statistic RV Percent Paced: 0.28 %
HIGH POWER IMPEDANCE MEASURED VALUE: 61.3688
Implantable Lead Implant Date: 20090114
Implantable Lead Location: 753862
Implantable Lead Model: 7121
Implantable Pulse Generator Implant Date: 20180215
Lead Channel Impedance Value: 362.5 Ohm
Lead Channel Impedance Value: 450 Ohm
Lead Channel Pacing Threshold Amplitude: 0.75 V
Lead Channel Pacing Threshold Pulse Width: 0.5 ms
Lead Channel Sensing Intrinsic Amplitude: 12 mV
Lead Channel Sensing Intrinsic Amplitude: 4.2 mV
MDC IDC LEAD IMPLANT DT: 20090114
MDC IDC LEAD LOCATION: 753859
MDC IDC MSMT LEADCHNL RV PACING THRESHOLD AMPLITUDE: 0.75 V
MDC IDC MSMT LEADCHNL RV PACING THRESHOLD PULSEWIDTH: 0.5 ms
MDC IDC SESS DTM: 20180222100951
MDC IDC SET LEADCHNL RA PACING AMPLITUDE: 2 V
MDC IDC SET LEADCHNL RV PACING AMPLITUDE: 2.5 V
MDC IDC SET LEADCHNL RV PACING PULSEWIDTH: 0.5 ms
MDC IDC SET LEADCHNL RV SENSING SENSITIVITY: 0.5 mV
MDC IDC STAT BRADY RA PERCENT PACED: 0 %
Pulse Gen Serial Number: 7337343

## 2016-09-10 NOTE — Progress Notes (Addendum)
Wound check appointment. Steri-strips removed. Right lateral incision open with scant purulent drainage and redness of incision edges with pain at site. WC assessed and recomended steri strips to be applied with a wound re-check in one week correlating with JA's schedule. Incision cleaned, no visible presence of sutures, steri strips applied. Patient instructed not to shower until wound re-check in one week.   Normal device function. Thresholds, sensing, and impedances consistent with implant measurements. Device programmed at chronic values s/p gen change. Histogram distribution appropriate for patient and level of activity. No mode switches or ventricular arrhythmias noted. ROV in D/C 2/28 for wound re-check and AS 3/8  Pt enrolled in [facility] clinic

## 2016-09-14 ENCOUNTER — Ambulatory Visit (INDEPENDENT_AMBULATORY_CARE_PROVIDER_SITE_OTHER): Payer: PRIVATE HEALTH INSURANCE | Admitting: *Deleted

## 2016-09-14 ENCOUNTER — Telehealth: Payer: Self-pay | Admitting: Cardiology

## 2016-09-14 ENCOUNTER — Other Ambulatory Visit: Payer: Self-pay

## 2016-09-14 DIAGNOSIS — Z95810 Presence of automatic (implantable) cardiac defibrillator: Secondary | ICD-10-CM

## 2016-09-14 MED ORDER — CEPHALEXIN 500 MG PO CAPS: 500.0000 mg | ORAL_CAPSULE | Freq: Two times a day (BID) | ORAL | 0 refills | Status: AC

## 2016-09-14 NOTE — Telephone Encounter (Signed)
Spoke with patient who stated that her steri strips have come off and the incision is presenting as red and painful. She reports a scant amount of purulent drainage at incision. Patient states she is able to be seen today, she is scheduled for 1130 on DC schedule.

## 2016-09-14 NOTE — Telephone Encounter (Signed)
Pt called and stated that she was seen on 09-10-16. She said part of her incision was open and device tech RN placed steri strips over the area. She stated that the steri strips and coming off and the site is bothering her more today then it was on 09-10-16. She wants to know if she can come in sooner than 2-28-2-18 to have site re-checked. Informed her that device tech RN was w/ another pt and she will call back once she is able to. Pt verbalized understanding.

## 2016-09-14 NOTE — Progress Notes (Signed)
Patient presents with right lateral incision open to air, painful and warm to touch. No current drainage seen. JA assessed, no stitches visualized. Dressing closed with steri strips, keflex ordered and patient scheduled to follow up in one week.

## 2016-09-16 ENCOUNTER — Ambulatory Visit: Payer: PRIVATE HEALTH INSURANCE

## 2016-09-21 ENCOUNTER — Telehealth: Payer: Self-pay

## 2016-09-21 ENCOUNTER — Ambulatory Visit: Payer: PRIVATE HEALTH INSURANCE

## 2016-09-21 NOTE — Telephone Encounter (Signed)
Called pt about missed wound check apt today. VM box full.

## 2016-09-22 ENCOUNTER — Telehealth: Payer: Self-pay

## 2016-09-22 NOTE — Progress Notes (Deleted)
Electrophysiology Office Note Date: 09/22/2016  ID:  Kelly [name], DOB February 01, 1986, MRN [id number]  PCP: Pcp Not In System Primary Cardiologist: Bensimhon Electrophysiologist: Allred   CC: PVC/ICD follow up  Kelly [name] is a 31 y.o. female seen today for [name].  She presents today for follow up of PVC's and wound recheck from recent ICD generator change.  Since last being seen in clinic, the patient reports doing reasonably well.  She continues with fatigue.  She also has palpitations.  She denies chest pain, dyspnea, PND, orthopnea, nausea, vomiting, dizziness, syncope, edema, weight gain, or early satiety.  She has not had ICD shocks.   Echo 07/2016 demonstrated EF 15-20%, mild MR, LA mildly to moderately dilated   Device History: STJ dual chamber ICD implanted 2009 for NICM, syncope; gen change 2018 History of appropriate therapy: No History of AAD therapy: No   Past Medical History:  Diagnosis Date  . CHF (congestive heart failure) (HCC)   . Diabetes mellitus without complication (HCC)   . Hypertension   . ICD (implantable cardioverter-defibrillator) in place    2009 St Jude   Past Surgical History:  Procedure Laterality Date  . CARDIAC DEFIBRILLATOR PLACEMENT    . ICD GENERATOR CHANGEOUT N/A 09/03/2016   Procedure: ICD Generator Changeout;  Surgeon: Hillis Range, MD;  Location: [facility] INVASIVE CV LAB;  Service: Cardiovascular;  Laterality: N/A;    Current Outpatient Prescriptions  Medication Sig Dispense Refill  . acetaminophen (TYLENOL) 500 MG tablet Take 1,000 mg by mouth every 6 (six) hours as needed (for pain.).     Marland Kitchen aspirin EC 81 MG tablet Take 81 mg by mouth daily.    . carvedilol (COREG) 12.5 MG tablet Take 1 tablet (12.5 mg total) by mouth 2 (two) times daily with a meal. (Patient taking differently: Take 12.5 mg by mouth 2 (two) times daily. ) 30 tablet 1  . diphenhydrAMINE (BENADRYL) 25 mg capsule Take 25 mg by mouth every 6 (six) hours as  needed (for itching.).    Marland Kitchen furosemide (LASIX) 20 MG tablet Take 20 mg by mouth daily as needed (for fluid retention.).     . Magnesium Oxide 200 MG TABS Take 1 tablet (200 mg total) by mouth 2 (two) times daily. 60 tablet 6  . metFORMIN (GLUCOPHAGE) 500 MG tablet Take 1 tablet (500 mg total) by mouth 2 (two) times daily with a meal. 60 tablet 1  . Multiple Vitamin (MULTIVITAMIN WITH MINERALS) TABS tablet Take 1 tablet by mouth at bedtime.    . potassium chloride SA (K-DUR,KLOR-CON) 20 MEQ tablet Take 2 tabs in AM and 1 tab in PM (Patient taking differently: Take 20-40 mEq by mouth 2 (two) times daily. 40 meq in the morning and 20 meq in the evening.) 270 tablet 3  . sacubitril-valsartan (ENTRESTO) 97-103 MG Take 1 tablet by mouth 2 (two) times daily. 180 tablet 3   No current facility-administered medications for this visit.     Allergies:   Aldactone [spironolactone] and Latex   Social History: Social History   Social History  . Marital status: Single    Spouse name: N/A  . Number of children: N/A  . Years of education: N/A   Occupational History  . Not on file.   Social History Main Topics  . Smoking status: Current Every Day Smoker    Packs/day: 0.25    Types: Cigarettes  . Smokeless tobacco: Never Used  . Alcohol use No  Comment: rarely  . Drug use: No  . Sexual activity: Yes    Birth control/ protection: Condom   Other Topics Concern  . Not on file   Social History Narrative  . No narrative on file    Family History: Grandmother with CAD and stents Cousin with SVT  Review of Systems: All other systems reviewed and are otherwise negative except as noted above.   Physical Exam: VS:  LMP 08/30/2016 (Approximate)  , BMI There is no height or weight on file to calculate BMI.  GEN- The patient is obese appearing, alert and oriented x 3 today.   HEENT: normocephalic, atraumatic; sclera clear, conjunctiva pink; hearing intact; oropharynx clear; neck supple    Lungs- Clear to ausculation bilaterally, normal work of breathing.  No wheezes, rales, rhonchi Heart- Regular rate and rhythm, frequent ectopy GI- soft, non-tender, non-distended, bowel sounds present  Extremities- no clubbing, cyanosis, or edema  MS- no significant deformity or atrophy Skin- warm and dry, no rash or lesion; ICD pocket well healed ***, multiple tattoos Psych- euthymic mood, full affect Neuro- strength and sensation are intact  ICD interrogation- reviewed in detail today,  See PACEART report  EKG:  EKG is not ordered today.  Recent Labs: 09/25/2015: ALT 47 07/12/2016: B Natriuretic Peptide 98.5; Hemoglobin 13.4 07/21/2016: Magnesium 1.6; TSH 1.433 08/26/2016: BUN 8; Creatinine, Ser 0.65; Platelets 267; Potassium 4.1; Sodium 137   Wt Readings from Last 3 Encounters:  09/03/16 242 lb (109.8 kg)  08/26/16 242 lb (109.8 kg)  08/19/16 241 lb 8 oz (109.5 kg)     Other studies Reviewed: Additional studies/ records that were reviewed today include: AHF notes  Assessment and Plan:  1.  Chronic systolic dysfunction euvolemic today Stable on an appropriate medical regimen Normal ICD function.  See Arita Miss Art report Continue follow up in AHF clinic Enroll in [facility] clinic after gen change  2.  PVC's Burden by device interrogation *** Would recommend EPS/ablation to see if cardiomyopathy improves Will wait a few more weeks to allow device pocket to completely heal   3.  HTN Stable No change required today   Current medicines are reviewed at length with the patient today.   The patient does not have concerns regarding her medicines.  The following changes were made today:  none  Labs/ tests ordered today include: none No orders of the defined types were placed in this encounter.    Disposition:   Follow up with [name] 3 weeks    Signed, Gypsy Balsam, NP 09/22/2016 7:29 AM  [facility] [address] [place]  [address] [phone] (office) [phone] (fax)

## 2016-09-22 NOTE — Telephone Encounter (Signed)
Patient referred to [facility] clinic by [name].  Attempted ICM call to patient for ICM intro and no answer.  Will schedule 1st ICM remote transmission for 10/26/2016.

## 2016-09-23 ENCOUNTER — Telehealth: Payer: Self-pay

## 2016-09-23 ENCOUNTER — Ambulatory Visit (INDEPENDENT_AMBULATORY_CARE_PROVIDER_SITE_OTHER): Payer: PRIVATE HEALTH INSURANCE | Admitting: *Deleted

## 2016-09-23 ENCOUNTER — Other Ambulatory Visit: Payer: Self-pay

## 2016-09-23 DIAGNOSIS — I5022 Chronic systolic (congestive) heart failure: Secondary | ICD-10-CM

## 2016-09-23 MED ORDER — CEPHALEXIN 500 MG PO CAPS: 500.0000 mg | ORAL_CAPSULE | Freq: Two times a day (BID) | ORAL | 0 refills | Status: AC

## 2016-09-23 NOTE — Progress Notes (Signed)
Pt presents today for a wound re-check. She reports chills but no fever with continued pain at site and has completed 10 days of keflex. Site presents warm to touch with tenderness upon palpation. Right lateral incision is un approximated with scab. Wound was assessed by Dr. Johney Frame. Instructions were given to keep incision covered and a second round of keflex ordered. Pt. Verbalizes understanding. ROV with DC in 1wk.

## 2016-09-24 ENCOUNTER — Ambulatory Visit: Payer: PRIVATE HEALTH INSURANCE | Admitting: Nurse Practitioner

## 2016-10-05 ENCOUNTER — Encounter: Payer: Self-pay | Admitting: *Deleted

## 2016-10-05 ENCOUNTER — Ambulatory Visit (INDEPENDENT_AMBULATORY_CARE_PROVIDER_SITE_OTHER): Payer: PRIVATE HEALTH INSURANCE | Admitting: *Deleted

## 2016-10-05 DIAGNOSIS — I5022 Chronic systolic (congestive) heart failure: Secondary | ICD-10-CM

## 2016-10-05 NOTE — Progress Notes (Signed)
Patient presents to the office for a wound recheck. Tegaderm and gauze removed. R lateral incision edge remains un-approximated with scab in place. No redness or edema at the incision. No drainage present on the dressing. Patient denies any fever or chills. Keflex completed yesterday.   Patient states that overall she believes the site is doing better, but states that it still remains slightly tender to touch. Dr.Allred assessed wound and recommended that patient follow up with Gypsy Balsam, NP as scheduled. He advised patient to keep the site open to air, but to dress the site loosely while at work. She was also advised to call for any signs of infection. Patient voiced understanding of all information provided. Patient was given 2x2 gauze and tegaderm upon request.   Dr.Allred released patient to return to work. Work note was provided for patient.

## 2016-10-07 NOTE — Telephone Encounter (Signed)
Unable to reach.

## 2016-11-03 NOTE — Progress Notes (Deleted)
Electrophysiology Office Note Date: 11/03/2016  ID:  Kelly [name], DOB [birth year], MRN [id number]  PCP: No PCP Per Patient Primary Cardiologist: Kelly [name] Electrophysiologist: Kelly [name]   CC: ICD and PVC follow up  Kelly [name] is a 31 y.o. female seen today for Dr Kelly [name].  She presents today after recent ICD gen change.  She relocated to [place] in 2017 from [place], [place]. 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  ICD was implanted for secondary prevention.  She has established with the AHF clinic and had medications optimized.  Other past medical history is notable for hypertension and diabetes. Since last being seen in clinic, the patient reports doing reasonably well.  She continues with fatigue.  She also has palpitations.  She denies chest pain, dyspnea, PND, orthopnea, nausea, vomiting, dizziness, syncope, edema, weight gain, or early satiety.  She has not had ICD shocks.   Echo 07/2016 demonstrated EF 15-20%, mild MR, LA mildly to moderately dilated   Device History: STJ dual chamber ICD implanted 2009 for NICM, syncope; gen change 2018 History of appropriate therapy: No History of AAD therapy: No   Past Medical History:  Diagnosis Date  . CHF (congestive heart failure) (HCC)   . Diabetes mellitus without complication (HCC)   . Hypertension   . ICD (implantable cardioverter-defibrillator) in place    2009 Kelly [name]   Past Surgical History:  Procedure Laterality Date  . CARDIAC DEFIBRILLATOR PLACEMENT    . ICD GENERATOR CHANGEOUT N/A 09/03/2016   Procedure: ICD Generator Changeout;  Surgeon: Kelly Range, MD;  Location: [facility] INVASIVE CV LAB;  Service: Cardiovascular;  Laterality: N/A;    Current Outpatient Prescriptions  Medication Sig Dispense Refill  . acetaminophen (TYLENOL) 500 MG tablet  Take 1,000 mg by mouth every 6 (six) hours as needed (for pain.).     Marland Kitchen aspirin EC 81 MG tablet Take 81 mg by mouth daily.    . carvedilol (COREG) 12.5 MG tablet Take 1 tablet (12.5 mg total) by mouth 2 (two) times daily with a meal. (Patient taking differently: Take 12.5 mg by mouth 2 (two) times daily. ) 30 tablet 1  . diphenhydrAMINE (BENADRYL) 25 mg capsule Take 25 mg by mouth every 6 (six) hours as needed (for itching.).    Marland Kitchen furosemide (LASIX) 20 MG tablet Take 20 mg by mouth daily as needed (for fluid retention.).     . Magnesium Oxide 200 MG TABS Take 1 tablet (200 mg total) by mouth 2 (two) times daily. 60 tablet 6  . metFORMIN (GLUCOPHAGE) 500 MG tablet Take 1 tablet (500 mg total) by mouth 2 (two) times daily with a meal. 60 tablet 1  . Multiple Vitamin (MULTIVITAMIN WITH MINERALS) TABS tablet Take 1 tablet by mouth at bedtime.    . potassium chloride SA (K-DUR,KLOR-CON) 20 MEQ tablet Take 2 tabs in AM and 1 tab in PM (Patient taking differently: Take 20-40 mEq by mouth 2 (two) times daily. 40 meq in the morning and 20 meq in the evening.) 270 tablet 3  . sacubitril-valsartan (ENTRESTO) 97-103 MG Take 1 tablet by mouth 2 (two) times daily. 180 tablet 3   No current facility-administered medications for this visit.     Allergies:   Aldactone [spironolactone] and Latex   Social History: Social History   Social History  .  Marital status: Single    Spouse name: N/A  . Number of children: N/A  . Years of education: N/A   Occupational History  . Not on file.   Social History Main Topics  . Smoking status: Current Every Day Smoker    Packs/day: 0.25    Types: Cigarettes  . Smokeless tobacco: Never Used  . Alcohol use No     Comment: rarely  . Drug use: No  . Sexual activity: Yes    Birth control/ protection: Condom   Other Topics Concern  . Not on file   Social History Narrative  . No narrative on file    Family History: Grandmother with CAD and stents Cousin with  SVT  Review of Systems: All other systems reviewed and are otherwise negative except as noted above.   Physical Exam: VS:  There were no vitals taken for this visit. , BMI There is no height or weight on file to calculate BMI.  GEN- The patient is obese appearing, alert and oriented x 3 today.   HEENT: normocephalic, atraumatic; sclera clear, conjunctiva pink; hearing intact; oropharynx clear; neck supple  Lungs- Clear to ausculation bilaterally, normal work of breathing.  No wheezes, rales, rhonchi Heart- Regular rate and rhythm, frequent ectopy GI- soft, non-tender, non-distended, bowel sounds present  Extremities- no clubbing, cyanosis, or edema  MS- no significant deformity or atrophy Skin- warm and dry, no rash or lesion; ICD pocket well healed, multiple tattoos Psych- euthymic mood, full affect Neuro- strength and sensation are intact  ICD interrogation- reviewed in detail today,  See PACEART report  EKG:  EKG is not ordered today.  Recent Labs: 07/12/2016: B Natriuretic Peptide 98.5; Hemoglobin 13.4 07/21/2016: Magnesium 1.6; TSH 1.433 08/26/2016: BUN 8; Creatinine, Ser 0.65; Platelets 267; Potassium 4.1; Sodium 137   Wt Readings from Last 3 Encounters:  09/03/16 242 lb (109.8 kg)  08/26/16 242 lb (109.8 kg)  08/19/16 241 lb 8 oz (109.5 kg)     Other studies Reviewed: Additional studies/ records that were reviewed today include: AHF notes, hospital records   Assessment and Plan:  1.  Chronic systolic dysfunction euvolemic today Stable on an appropriate medical regimen Normal ICD function  See Pace Art report Continue follow up in AHF clinic Enroll in ICM clinic  Wound ***  2.  PVC's Burden by device interrogation ***   3.  HTN Stable No change required today   Current medicines are reviewed at length with the patient today.   The patient does not have concerns regarding her medicines.  The following changes were made today:  none  Labs/ tests ordered  today include: none No orders of the defined types were placed in this encounter.    Disposition:   Follow up with Dr Kelly [name] 3 months    Signed, Kelly Balsam, NP 11/03/2016 8:49 AM  [facility] [address] [place] [address] [phone] (office) [phone] (fax)

## 2016-11-04 ENCOUNTER — Encounter: Payer: PRIVATE HEALTH INSURANCE | Admitting: Nurse Practitioner

## 2016-11-04 ENCOUNTER — Encounter: Payer: Self-pay | Admitting: Nurse Practitioner

## 2016-11-05 ENCOUNTER — Encounter: Payer: Self-pay | Admitting: Nurse Practitioner

## 2016-11-09 ENCOUNTER — Encounter (HOSPITAL_COMMUNITY): Payer: PRIVATE HEALTH INSURANCE

## 2016-12-12 ENCOUNTER — Emergency Department (HOSPITAL_COMMUNITY): Payer: PRIVATE HEALTH INSURANCE

## 2016-12-12 ENCOUNTER — Encounter (HOSPITAL_COMMUNITY): Payer: Self-pay

## 2016-12-12 ENCOUNTER — Emergency Department (HOSPITAL_COMMUNITY)
Admission: EM | Admit: 2016-12-12 | Discharge: 2016-12-12 | Disposition: A | Discharge status: Home / Self Care | Payer: PRIVATE HEALTH INSURANCE | Attending: Emergency Medicine | Admitting: Emergency Medicine

## 2016-12-12 DIAGNOSIS — R197 Diarrhea, unspecified: Secondary | ICD-10-CM | POA: Diagnosis not present

## 2016-12-12 DIAGNOSIS — Z7982 Long term (current) use of aspirin: Secondary | ICD-10-CM | POA: Diagnosis not present

## 2016-12-12 DIAGNOSIS — I509 Heart failure, unspecified: Secondary | ICD-10-CM | POA: Diagnosis not present

## 2016-12-12 DIAGNOSIS — Z7984 Long term (current) use of oral hypoglycemic drugs: Secondary | ICD-10-CM | POA: Insufficient documentation

## 2016-12-12 DIAGNOSIS — R112 Nausea with vomiting, unspecified: Secondary | ICD-10-CM | POA: Insufficient documentation

## 2016-12-12 DIAGNOSIS — Z91040 Latex allergy status: Secondary | ICD-10-CM | POA: Diagnosis not present

## 2016-12-12 DIAGNOSIS — Z79899 Other long term (current) drug therapy: Secondary | ICD-10-CM | POA: Diagnosis not present

## 2016-12-12 DIAGNOSIS — I10 Essential (primary) hypertension: Secondary | ICD-10-CM | POA: Insufficient documentation

## 2016-12-12 DIAGNOSIS — E119 Type 2 diabetes mellitus without complications: Secondary | ICD-10-CM | POA: Insufficient documentation

## 2016-12-12 DIAGNOSIS — R079 Chest pain, unspecified: Secondary | ICD-10-CM | POA: Insufficient documentation

## 2016-12-12 DIAGNOSIS — F17210 Nicotine dependence, cigarettes, uncomplicated: Secondary | ICD-10-CM | POA: Insufficient documentation

## 2016-12-12 LAB — CBC
HCT: 39.1 % (ref 36.0–46.0)
HEMOGLOBIN: 13.3 g/dL (ref 12.0–15.0)
MCH: 29.1 pg (ref 26.0–34.0)
MCHC: 34 g/dL (ref 30.0–36.0)
MCV: 85.6 fL (ref 78.0–100.0)
PLATELETS: 237 10*3/uL (ref 150–400)
RBC: 4.57 MIL/uL (ref 3.87–5.11)
RDW: 13 % (ref 11.5–15.5)
WBC: 8.7 10*3/uL (ref 4.0–10.5)

## 2016-12-12 LAB — I-STAT TROPONIN, ED
TROPONIN I, POC: 0.01 ng/mL (ref 0.00–0.08)
Troponin i, poc: 0.01 ng/mL (ref 0.00–0.08)

## 2016-12-12 LAB — BASIC METABOLIC PANEL
ANION GAP: 8 (ref 5–15)
BUN: 8 mg/dL (ref 6–20)
CALCIUM: 9.1 mg/dL (ref 8.9–10.3)
CO2: 25 mmol/L (ref 22–32)
CREATININE: 0.7 mg/dL (ref 0.44–1.00)
Chloride: 104 mmol/L (ref 101–111)
Glucose, Bld: 258 mg/dL — ABNORMAL HIGH (ref 65–99)
Potassium: 3.7 mmol/L (ref 3.5–5.1)
SODIUM: 137 mmol/L (ref 135–145)

## 2016-12-12 LAB — BRAIN NATRIURETIC PEPTIDE: B Natriuretic Peptide: 70.1 pg/mL (ref 0.0–100.0)

## 2016-12-12 MED ORDER — NITROGLYCERIN 0.4 MG SL SUBL
0.4000 mg | SUBLINGUAL_TABLET | Freq: Once | SUBLINGUAL | Status: AC
  Administered 2016-12-12: 0.4 mg via SUBLINGUAL
  Filled 2016-12-12: qty 1

## 2016-12-12 MED ORDER — KETOROLAC TROMETHAMINE 15 MG/ML IJ SOLN
15.0000 mg | Freq: Once | INTRAMUSCULAR | Status: AC
  Administered 2016-12-12: 15 mg via INTRAVENOUS
  Filled 2016-12-12: qty 1

## 2016-12-12 MED ORDER — ONDANSETRON 4 MG PO TBDP: 4.0000 mg | ORAL_TABLET | Freq: Three times a day (TID) | ORAL | 0 refills | Status: DC | PRN

## 2016-12-12 NOTE — ED Triage Notes (Signed)
Pt presents w/ intermittent left sided cp that radiates to her left shoulder along with n/v and dizziness. Pt reports these s/s began x2 days ago. Pt Pt A+OX4, speaking in complete sentences, ambulatory to triage.

## 2016-12-12 NOTE — Discharge Instructions (Signed)
Your workup today was reassuring. We advised follow-up with your cardiologist on Tuesday. Your cardiologist is aware of your need for follow-up. We recommend Zofran as needed for nausea and over-the-counter Imodium for diarrhea. Your symptoms may be due to a viral illness. Return to the ED for new or concerning symptoms.

## 2016-12-12 NOTE — ED Provider Notes (Signed)
[facility]-EMERGENCY DEPT Provider Note   CSN: [id number] Arrival date & time: 12/12/16  0112    History   Chief Complaint Chief Complaint  Patient presents with  . Chest Pain  . Emesis  . Dizziness    HPI Kelly [name] is a 31 y.o. female.  31 year old female with hx of CHF (LVEF 15-20%) s/p STJ ICD with generator change out in February, DM, and HTN presents to the emergency department for evaluation of left-sided chest pain. She states that pain radiates to her left shoulder. Pain began shortly after waking, at ~1400. She notes associated nausea with subsequent emesis as well as dizziness characterized by the room spinning, making it difficulty for her to focus. She also notes lightheadedness, though denies syncope or near syncope. Symptoms became associated with palpitations at 0030. She reports compliance with her medications. She reports a hx of similar symptoms in the past, when her ICD was placed in 2009. No recent fevers, weight gain, or leg swelling.  Cardiology - Dr. Johney Frame   The history is provided by the patient. No language interpreter was used.    Past Medical History:  Diagnosis Date  . CHF (congestive heart failure) (HCC)   . Diabetes mellitus without complication (HCC)   . Hypertension   . ICD (implantable cardioverter-defibrillator) in place    2009 [employer]    There are no active problems to display for this patient.   Past Surgical History:  Procedure Laterality Date  . CARDIAC DEFIBRILLATOR PLACEMENT    . ICD GENERATOR CHANGEOUT N/A 09/03/2016   Procedure: ICD Generator Changeout;  Surgeon: Hillis Range, MD;  Location: [facility] INVASIVE CV LAB;  Service: Cardiovascular;  Laterality: N/A;    OB History    No data available       Home Medications    Prior to Admission medications   Medication Sig Start Date End Date Taking? Authorizing Provider  acetaminophen (TYLENOL) 500 MG tablet Take 1,000 mg by mouth every 6 (six) hours as needed (for pain.).      [provider]  aspirin EC 81 MG tablet Take 81 mg by mouth daily.    [provider]  carvedilol (COREG) 12.5 MG tablet Take 1 tablet (12.5 mg total) by mouth 2 (two) times daily with a meal. Patient taking differently: Take 12.5 mg by mouth 2 (two) times daily.  07/12/16   Kirichenko, Lemont Fillers, PA-C  diphenhydrAMINE (BENADRYL) 25 mg capsule Take 25 mg by mouth every 6 (six) hours as needed (for itching.).    [provider]  furosemide (LASIX) 20 MG tablet Take 20 mg by mouth daily as needed (for fluid retention.).     [provider]  Magnesium Oxide 200 MG TABS Take 1 tablet (200 mg total) by mouth 2 (two) times daily. 07/22/16 08/21/17  Tonye Becket D, NP  metFORMIN (GLUCOPHAGE) 500 MG tablet Take 1 tablet (500 mg total) by mouth 2 (two) times daily with a meal. 09/26/15   Antony Madura, PA-C  Multiple Vitamin (MULTIVITAMIN WITH MINERALS) TABS tablet Take 1 tablet by mouth at bedtime.    [provider]  potassium chloride SA (K-DUR,KLOR-CON) 20 MEQ tablet Take 2 tabs in AM and 1 tab in PM Patient taking differently: Take 20-40 mEq by mouth 2 (two) times daily. 40 meq in the morning and 20 meq in the evening. 08/19/16   Bensimhon, Bevelyn Buckles, MD  sacubitril-valsartan (ENTRESTO) 97-103 MG Take 1 tablet by mouth 2 (two) times daily. 08/19/16  Bensimhon, Bevelyn Buckles, MD    Family History History reviewed. No pertinent family history.  Social History Social History  Substance Use Topics  . Smoking status: Current Every Day Smoker    Packs/day: 0.25    Types: Cigarettes  . Smokeless tobacco: Never Used  . Alcohol use No     Comment: rarely     Allergies   Aldactone [spironolactone] and Latex   Review of Systems Review of Systems Ten systems reviewed and are negative for acute change, except as noted in the HPI.    Physical Exam Updated Vital Signs BP 132/71 (BP Location: Right Arm)   Pulse 87   Temp 97.9 F (36.6 C) (Oral)   Resp 20   Ht 5'  6" (1.676 m)   Wt 112.4 kg (247 lb 12.8 oz)   LMP 10/20/2016   SpO2 99%   BMI 40.00 kg/m   Physical Exam  Constitutional: She is oriented to person, place, and time. She appears well-developed and well-nourished. No distress.  Nontoxic and in NAD  HENT:  Head: Normocephalic and atraumatic.  Eyes: Conjunctivae and EOM are normal. No scleral icterus.  Neck: Normal range of motion.  Cardiovascular: Normal rate, regular rhythm and intact distal pulses.   Pulmonary/Chest: Effort normal. No respiratory distress. She has no wheezes. She has no rales.  Lungs grossly CTAB  Musculoskeletal: Normal range of motion.  No significant pitting edema in BLE  Neurological: She is alert and oriented to person, place, and time. She exhibits normal muscle tone. Coordination normal.  GCS 15. Patient moving all extremities.  Skin: Skin is warm and dry. No rash noted. She is not diaphoretic. No erythema. No pallor.  Psychiatric: She has a normal mood and affect. Her behavior is normal.  Nursing note and vitals reviewed.    ED Treatments / Results  Labs (all labs ordered are listed, but only abnormal results are displayed) Labs Reviewed  BASIC METABOLIC PANEL - Abnormal; Notable for the following:       Result Value   Glucose, Bld 258 (*)    All other components within normal limits  CBC  BRAIN NATRIURETIC PEPTIDE  I-STAT TROPOININ, ED    EKG  EKG Interpretation  Date/Time:  Saturday Dec 12 2016 01:24:49 EDT Ventricular Rate:  88 PR Interval:    QRS Duration: 112 QT Interval:  418 QTC Calculation: 470 R Axis:   -55 Text Interpretation:  Sinus rhythm Ventricular bigeminy Prolonged PR interval Incomplete right bundle branch block Probable left ventricular hypertrophy Inferior infarct, acute (RCA) Anterior Q waves, possibly due to LVH Probable RV involvement, suggest recording right precordial leads No significant change since last tracing 03 Sep 2016 Confirmed by Devoria Albe ([id number]) on  12/12/2016 1:27:50 AM       Radiology Dg Chest 2 View  Result Date: 12/12/2016 CLINICAL DATA:  Intermittent LEFT chest pain, nausea, vomiting and dizziness for 2 days. History of CHF, diabetes. EXAM: CHEST  2 VIEW COMPARISON:  Chest radiograph July 12, 2016 FINDINGS: Cardiac silhouette is mildly enlarged and unchanged. Dual lead LEFT cardiac defibrillator in situ. No pleural effusion or focal consolidation. No pneumothorax. Soft tissue planes and included osseous structures are normal. IMPRESSION: Stable examination:  Mild cardiomegaly, no acute pulmonary process. Electronically Signed   By: Awilda Metro M.D.   On: 12/12/2016 01:45    Procedures Procedures (including critical care time)  Medications Ordered in ED Medications  nitroGLYCERIN (NITROSTAT) SL tablet 0.4 mg (not administered)  ketorolac (TORADOL) 15 MG/ML  injection 15 mg (not administered)     Initial Impression / Assessment and Plan / ED Course  I have reviewed the triage vital signs and the nursing notes.  Pertinent labs & imaging results that were available during my care of the patient were reviewed by me and considered in my medical decision making (see chart for details).      31 year old female with a history of congestive heart failure status post ICD placement in 2009 presents to the emergency department for chest pain which radiates to her left shoulder. This is intermittent and nonspecific. Patient denies modifying factors. She also complains of dizziness and lightheadedness. She has had some nausea, vomiting, and diarrhea as well.  Symptoms are of unclear etiology, though workup today is reassuring. Patient has had no bradycardia. No hypotension. No overt signs of CHF exacerbation. Question whether her lightheadedness and dizziness may be secondary to mild dehydration given vomiting and diarrhea. Vomiting and diarrhea suspected to be due to a viral illness.  I have discussed the patient's case with Dr.  Shirlee Latch, on-call for cardiology. Dr. Shirlee Latch does not believe the patient's symptoms to be due to an ischemic coronary process. As patient has no concern for acute CHF exacerbation, he does not believe admission is indicated currently. Dr. Shirlee Latch will facilitate close follow-up with the patient's cardiologist, Dr. Johney Frame.  Workup in plan for outpatient management discussed with patient. She verbalizes comfort and understanding with plan. Supportive care and advised and return precautions given. Patient discharged in stable condition with no unaddressed concerns.   Final Clinical Impressions(s) / ED Diagnoses   Final diagnoses:  Nausea vomiting and diarrhea  Nonspecific chest pain    New Prescriptions Discharge Medication List as of 12/12/2016  6:12 AM    START taking these medications   Details  ondansetron (ZOFRAN ODT) 4 MG disintegrating tablet Take 1 tablet (4 mg total) by mouth every 8 (eight) hours as needed for nausea or vomiting., Starting Sat 12/12/2016, Print         Antony Madura, PA-C 12/12/16 2234    Devoria Albe, MD 12/16/16 2257

## 2016-12-12 NOTE — ED Provider Notes (Signed)
Patient has a history congestive heart failure and is followed by Dr. Johney Frame, cardiology. She reports she was feeling dizzy today and having some trouble focusing, she said she had some central chest pain that radiated into her left axilla that lasted about 3 minutes. She states it felt like a muscle twisting pain. She's had it before with her congestive heart failure. She only feels short of breath when she has the chest discomfort. She denies any swelling of her extremities but states her abdomen feels bloated. She denies fever. She states she's had nausea and vomited once tonight and she's had diarrhea about 4-5 times, the last time was about 10 PM.  Patient is alert and cooperative and she is comfortable without apparent shortness of breath. Patient's lungs are clear without wheezes rhonchi or rales Cardiovascular patient has a irregular rhythm which when I look at the monitor she's having a ventricular activity. Patient's abdomen is soft Extremities reveal no pitting edema  Medical screening examination/treatment/procedure(s) were conducted as a shared visit with non-physician practitioner(s) and myself.  I personally evaluated the patient during the encounter.   EKG Interpretation  Date/Time:  Saturday Dec 12 2016 01:24:49 EDT Ventricular Rate:  88 PR Interval:    QRS Duration: 112 QT Interval:  418 QTC Calculation: 470 R Axis:   -55 Text Interpretation:  Sinus rhythm Ventricular bigeminy Prolonged PR interval Incomplete right bundle branch block Probable left ventricular hypertrophy Inferior infarct, acute (RCA) Anterior Q waves, possibly due to LVH Probable RV involvement, suggest recording right precordial leads No significant change since last tracing 03 Sep 2016 Confirmed by Devoria Albe ([id number]) on 12/12/2016 1:27:50 AM       Devoria Albe, MD, Concha Pyo, MD 12/12/16 [phone]

## 2016-12-15 ENCOUNTER — Encounter (INDEPENDENT_AMBULATORY_CARE_PROVIDER_SITE_OTHER): Payer: Self-pay

## 2016-12-15 ENCOUNTER — Ambulatory Visit (INDEPENDENT_AMBULATORY_CARE_PROVIDER_SITE_OTHER): Payer: PRIVATE HEALTH INSURANCE | Admitting: Nurse Practitioner

## 2016-12-15 ENCOUNTER — Encounter: Payer: Self-pay | Admitting: Nurse Practitioner

## 2016-12-15 VITALS — BP 138/90 | HR 91 | Ht 66.0 in | Wt 245.6 lb

## 2016-12-15 DIAGNOSIS — I5022 Chronic systolic (congestive) heart failure: Secondary | ICD-10-CM

## 2016-12-15 DIAGNOSIS — I10 Essential (primary) hypertension: Secondary | ICD-10-CM

## 2016-12-15 DIAGNOSIS — Z7689 Persons encountering health services in other specified circumstances: Secondary | ICD-10-CM | POA: Diagnosis not present

## 2016-12-15 LAB — CUP PACEART INCLINIC DEVICE CHECK
Date Time Interrogation Session: 20180529120527
Implantable Lead Implant Date: 20090114
Implantable Lead Implant Date: 20090114
Implantable Lead Location: 753859
Implantable Lead Model: 7121
Implantable Pulse Generator Implant Date: 20180215
MDC IDC LEAD LOCATION: 753862
MDC IDC PG SERIAL: 7337343

## 2016-12-15 NOTE — Patient Instructions (Addendum)
Medication Instructions:    Your physician recommends that you continue on your current medications as directed. Please refer to the Current Medication list given to you today.  - If you need a refill on your cardiac medications before your next appointment, please call your pharmacy.   Labwork:  None ordered  Testing/Procedures:  None ordered  Follow-Up:  Your physician recommends that you schedule a follow-up appointment in: 3 months with Dr. Johney Frame (cancel the 6/4 appt)   Your physician recommends that you schedule a follow-up appointment in: 2 weeks with the heart failure clinicl   You have been referred to [facility]   Thank you for choosing [facility]!!

## 2016-12-15 NOTE — Progress Notes (Signed)
Electrophysiology Office Note Date: 12/15/2016  ID:  Kelly [name], DOB 1986/01/04, MRN [id number]  PCP: Patient, No Pcp Per Primary Cardiologist: Bensimhon Electrophysiologist: Allred   CC: ER and ICD follow up  Kelly [name] is a 31 y.o. female seen today for [name].  Since last being seen by me, she underwent ICD gen change with poor healing post op. She has no showed several appointments. She was seen in the ER over the weekend for nausea/vomiting/atypical chest pain and presents today for follow up.  Since being seen in ER, the patient reports ongoing nausea, diarrhea, constant chest pressure that has not subsided since the weekend.  She continues with fatigue.  She also has palpitations.  She denies PND, orthopnea, dizziness, syncope, edema, weight gain, or early satiety.  She has not had ICD shocks.   Echo 07/2016 demonstrated EF 15-20%, mild MR, LA mildly to moderately dilated   She asks about a work note today  Device History: STJ dual chamber ICD implanted 2009 for NICM, syncope History of appropriate therapy: No History of AAD therapy: No   Past Medical History:  Diagnosis Date  . CHF (congestive heart failure) (HCC)   . Diabetes mellitus without complication (HCC)   . Hypertension   . ICD (implantable cardioverter-defibrillator) in place    2009 St Jude   Past Surgical History:  Procedure Laterality Date  . CARDIAC DEFIBRILLATOR PLACEMENT    . ICD GENERATOR CHANGEOUT N/A 09/03/2016   Procedure: ICD Generator Changeout;  Surgeon: Hillis Range, MD;  Location: [facility] INVASIVE CV LAB;  Service: Cardiovascular;  Laterality: N/A;    Current Outpatient Prescriptions  Medication Sig Dispense Refill  . carvedilol (COREG) 12.5 MG tablet Take 1 tablet (12.5 mg total) by mouth 2 (two) times daily with a meal. 30 tablet 1  . Magnesium Oxide 200 MG TABS Take 1 tablet (200 mg total) by mouth 2 (two) times daily. 60 tablet 6  . metFORMIN (GLUCOPHAGE) 500 MG tablet  Take 1 tablet (500 mg total) by mouth 2 (two) times daily with a meal. 60 tablet 1  . ondansetron (ZOFRAN ODT) 4 MG disintegrating tablet Take 1 tablet (4 mg total) by mouth every 8 (eight) hours as needed for nausea or vomiting. 10 tablet 0  . potassium chloride SA (K-DUR,KLOR-CON) 20 MEQ tablet Take 2 tabs in AM and 1 tab in PM 270 tablet 3  . sacubitril-valsartan (ENTRESTO) 97-103 MG Take 1 tablet by mouth 2 (two) times daily. 180 tablet 3   No current facility-administered medications for this visit.     Allergies:   Aldactone [spironolactone] and Latex   Social History: Social History   Social History  . Marital status: Single    Spouse name: N/A  . Number of children: N/A  . Years of education: N/A   Occupational History  . Not on file.   Social History Main Topics  . Smoking status: Current Every Day Smoker    Packs/day: 0.25    Types: Cigarettes  . Smokeless tobacco: Never Used  . Alcohol use No     Comment: rarely  . Drug use: No  . Sexual activity: Yes    Birth control/ protection: Condom   Other Topics Concern  . Not on file   Social History Narrative  . No narrative on file    Family History: Grandmother with CAD and stents Cousin with SVT  Review of Systems: All other systems reviewed and are otherwise negative except as noted above.  Physical Exam: VS:  BP 138/90   Pulse 91   Ht 5\' 6"  (1.676 m)   Wt 245 lb 9.6 oz (111.4 kg)   SpO2 97%   BMI 39.64 kg/m  , BMI Body mass index is 39.64 kg/m.  GEN- The patient is obese appearing, alert and oriented x 3 today.   HEENT: normocephalic, atraumatic; sclera clear, conjunctiva pink; hearing intact; oropharynx clear; neck supple  Lungs- Clear to ausculation bilaterally, normal work of breathing.  No wheezes, rales, rhonchi Heart- Regular rate and rhythm, frequent ectopy GI- soft, non-tender, non-distended, bowel sounds present  Extremities- no clubbing, cyanosis, or edema  MS- no significant  deformity or atrophy Skin- warm and dry, no rash or lesion; ICD pocket well healed, multiple tattoos, diffuse rash over bilateral upper extremities Psych- euthymic mood, full affect Neuro- strength and sensation are intact  ICD interrogation- reviewed in detail today,  See PACEART report  EKG:  EKG is ordered today. EKG today demonstrates sinus rhythm with bigeminal PVCs  Recent Labs: 07/21/2016: Magnesium 1.6; TSH 1.433 12/12/2016: B Natriuretic Peptide 70.1; BUN 8; Creatinine, Ser 0.70; Hemoglobin 13.3; Platelets 237; Potassium 3.7; Sodium 137   Wt Readings from Last 3 Encounters:  12/15/16 245 lb 9.6 oz (111.4 kg)  12/12/16 247 lb 12.8 oz (112.4 kg)  09/03/16 242 lb (109.8 kg)     Other studies Reviewed: Additional studies/ records that were reviewed today include: AHF notes  Assessment and Plan:  1.  Chronic systolic dysfunction euvolemic today Stable on an appropriate medical regimen Normal ICD function.  No changes today See Arita Miss Art report Continue follow up in AHF clinic  2.  PVC's Stable No change required today Could consider ablation vs AAD therapy down the road   3.  HTN Stable No change required today  4.  Chest pain Atypical  LHC 2005 with no CAD Troponin negative in the ER EKG without ischemic changes As her chest pressure has been constant for 3 days and is in the setting of associated diarrhea, nausea, I do not feel this is cardiac.  I have advised follow up with PCP - will refer to [facility] today to establish.    Current medicines are reviewed at length with the patient today.   The patient does not have concerns regarding her medicines.  The following changes were made today:  none  Labs/ tests ordered today include:  Orders Placed This Encounter  Procedures  . Ambulatory referral to [facility]  . CUP PACEART INCLINIC DEVICE CHECK  . EKG 12-Lead     Disposition:   Follow up with AHF 4 weeks (overdue), [name] 3 months     Signed, Gypsy Balsam, NP 12/15/2016 12:51 PM  [facility] [address] [place] [address] [phone] (office) [phone] (fax)

## 2016-12-21 ENCOUNTER — Ambulatory Visit (INDEPENDENT_AMBULATORY_CARE_PROVIDER_SITE_OTHER): Payer: PRIVATE HEALTH INSURANCE | Admitting: Family Medicine

## 2016-12-21 ENCOUNTER — Encounter: Payer: Self-pay | Admitting: Family Medicine

## 2016-12-21 ENCOUNTER — Encounter: Payer: Self-pay | Admitting: Internal Medicine

## 2016-12-21 VITALS — BP 132/84 | HR 44 | Temp 98.5°F | Ht 66.0 in | Wt 245.6 lb

## 2016-12-21 DIAGNOSIS — E119 Type 2 diabetes mellitus without complications: Secondary | ICD-10-CM

## 2016-12-21 DIAGNOSIS — R413 Other amnesia: Secondary | ICD-10-CM | POA: Diagnosis not present

## 2016-12-21 DIAGNOSIS — I509 Heart failure, unspecified: Secondary | ICD-10-CM | POA: Diagnosis not present

## 2016-12-21 DIAGNOSIS — Z6841 Body Mass Index (BMI) 40.0 and over, adult: Secondary | ICD-10-CM

## 2016-12-21 DIAGNOSIS — Z95810 Presence of automatic (implantable) cardiac defibrillator: Secondary | ICD-10-CM

## 2016-12-21 DIAGNOSIS — R197 Diarrhea, unspecified: Secondary | ICD-10-CM

## 2016-12-21 DIAGNOSIS — F17210 Nicotine dependence, cigarettes, uncomplicated: Secondary | ICD-10-CM

## 2016-12-21 DIAGNOSIS — R002 Palpitations: Secondary | ICD-10-CM | POA: Diagnosis not present

## 2016-12-21 MED ORDER — METRONIDAZOLE 500 MG PO TABS: 500.0000 mg | ORAL_TABLET | Freq: Three times a day (TID) | ORAL | 0 refills | Status: AC

## 2016-12-21 NOTE — Progress Notes (Signed)
Kelly [name] is a 31 y.o. female is here to [employer].   Patient Care Team: Helane Rima, DO as PCP - General (Family Medicine)   History of Present Illness:   Joseph Art, CMA, acting as scribe for Dr. Earlene Plater.  HPI:  1. Diarrhea. Started 2 weeks ago. Described as watery, light colored. No blood or mucus. No abdominal pain. Some nausea and NBNV intermittently. Staying hydrated.   2. Palpitations. Intermittent. Followed by Cardiology - visit on 5/29 reviewed. ICD in place. No shocks. No dizziness, syncope, edema, weight gain.    3. Memory difficulty. Short-term. Started around the time she had her ICD changeout.     4. Diabetes mellitus.  Current symptoms: no polyuria or polydipsia, no numbness, tingling or pain in extremities.  Taking medication compliantly without noted sided effects [x]   YES  []   NO  Episodes of hypoglycemia? []   YES  [x]   NO Maintaining a diabetic diet? []   YES  [x]   NO Trying to exercise on a regular basis? []   YES  [x]   NO  On ACE inhibitor or angiotensin II receptor blocker? [x]   YES  []   NO On Aspirin? []   YES  [x]   NO  Lab Results  Component Value Date   HGBA1C 9.2 (H) 12/21/2016    No results found for: MICROALBUR, MALB24HUR  No results found for: CHOL, HDL, LDLCALC, LDLDIRECT, TRIG, CHOLHDL    Wt Readings from Last 3 Encounters:  12/21/16 245 lb 9.6 oz (111.4 kg)  12/15/16 245 lb 9.6 oz (111.4 kg)  12/12/16 247 lb 12.8 oz (112.4 kg)    reports that she has been smoking Cigarettes.  She has been smoking about 0.25 packs per day. She has never used smokeless tobacco. BP Readings from Last 3 Encounters:  12/21/16 132/84  12/15/16 138/90  12/12/16 117/65   Lab Results  Component Value Date   CREATININE 0.68 12/21/2016    5. AICD (automatic cardioverter/defibrillator) present. Replaced 12/15/16 with healing complications.    6. Congestive heart failure.   08/13/16 ECHO: - Left ventricle: LVEF 15 to 20% The cavity size was  severely dilated. Wall thickness was normal. - Aortic valve: There was trivial regurgitation. - Mitral valve: There was mild regurgitation. - Left atrium: The atrium was mildly to moderately dilated.   Health Maintenance Due  Topic Date Due  . FOOT EXAM  09/20/1993  . OPHTHALMOLOGY EXAM  09/20/1993  . URINE MICROALBUMIN  09/20/1993  . PNEUMOCOCCAL POLYSACCHARIDE VACCINE (2) 07/17/2014   Immunization History  Administered Date(s) Administered  . Hepatitis A, Ped/Adol-2 Dose 10/09/2013  . Hepatitis B, ped/adol 10/09/2013, 12/18/2013, 05/24/2014  . Influenza Split 07/17/2009, 06/18/2013, 05/24/2014  . PPD Test 09/05/2015  . Pneumococcal Polysaccharide-23 07/17/2009  . Tdap 10/09/2013   PMHx, SurgHx, SocialHx, Medications, and Allergies were reviewed in the Visit Navigator and updated as appropriate.   Past Medical History:  Diagnosis Date  . CHF (congestive heart failure) (HCC)   . Diabetes mellitus without complication (HCC)   . Hypertension   . ICD (implantable cardioverter-defibrillator) in place    Past Surgical History:  Procedure Laterality Date  . CARDIAC DEFIBRILLATOR PLACEMENT    . ICD GENERATOR CHANGEOUT N/A 09/03/2016   Procedure: ICD Generator Changeout;  Surgeon: Hillis Range, MD;  Location: [facility] INVASIVE CV LAB;  Service: Cardiovascular;  Laterality: N/A;   Family History  Problem Relation Age of Onset  . Mental illness Brother   . Diabetes Maternal Grandmother   . Heart disease  Maternal Grandmother   . Hyperlipidemia Maternal Grandmother   . Hypertension Maternal Grandmother    Social History  Substance Use Topics  . Smoking status: Current Every Day Smoker    Packs/day: 0.25    Types: Cigarettes  . Smokeless tobacco: Never Used  . Alcohol use No     Comment: rarely   Current Medications and Allergies:   .  carvedilol (COREG) 12.5 MG tablet, Take 1 tablet (12.5 mg total) by mouth 2 (two) times daily with a meal., Disp: 30 tablet, Rfl: 1 .  Magnesium  Oxide 200 MG TABS, Take 1 tablet (200 mg total) by mouth 2 (two) times daily., Disp: 60 tablet, Rfl: 6 .  metFORMIN (GLUCOPHAGE) 500 MG tablet, Take 1 tablet (500 mg total) by mouth 2 (two) times daily with a meal., Disp: 60 tablet, Rfl: 1 .  ondansetron (ZOFRAN ODT) 4 MG disintegrating tablet, Take 1 tablet (4 mg total) by mouth every 8 (eight) hours as needed for nausea or vomiting., Disp: 10 tablet, Rfl: 0 .  potassium chloride SA (K-DUR,KLOR-CON) 20 MEQ tablet, Take 2 tabs in AM and 1 tab in PM, Disp: 270 tablet, Rfl: 3 .  sacubitril-valsartan (ENTRESTO) 97-103 MG, Take 1 tablet by mouth 2 (two) times daily., Disp: 180 tablet, Rfl: 3 .  metroNIDAZOLE (FLAGYL) 500 MG tablet, Take 1 tablet (500 mg total) by mouth 3 (three) times daily., Disp: 30 tablet, Rfl: 0  Allergies  Allergen Reactions  . Aldactone [Spironolactone] Shortness Of Breath  . Latex Rash   Review of Systems:   Review of Systems  Constitutional: Positive for malaise/fatigue.  HENT: Negative for congestion and sore throat.   Eyes: Negative for blurred vision and pain.  Respiratory: Negative for shortness of breath.   Cardiovascular: Negative for palpitations.  Gastrointestinal: Positive for nausea.  Genitourinary: Negative for frequency.  Musculoskeletal: Positive for back pain. Negative for neck pain.  Skin: Negative for rash.  Neurological: Negative for loss of consciousness and weakness.  Endo/Heme/Allergies: Does not bruise/bleed easily.  Psychiatric/Behavioral: Negative for depression. The patient is not nervous/anxious.    Vitals:   Vitals:   12/21/16 1449  BP: 132/84  Pulse: (!) 44  Temp: 98.5 F (36.9 C)  TempSrc: Oral  SpO2: 97%  Weight: 245 lb 9.6 oz (111.4 kg)  Height: 5\' 6"  (1.676 m)     Body mass index is 39.64 kg/m.  Physical Exam:   Physical Exam  Constitutional: She is oriented to person, place, and time. She appears well-developed and well-nourished. No distress.  HENT:  Head:  Normocephalic and atraumatic.  Eyes: EOM are normal. Pupils are equal, round, and reactive to light.  Neck: Normal range of motion. Neck supple. No thyromegaly present.  Cardiovascular: Normal rate and intact distal pulses.   Pulmonary/Chest: Effort normal and breath sounds normal.  Abdominal: Soft. She exhibits no distension and no mass. There is no guarding. No hernia.  Neurological: She is alert and oriented to person, place, and time. No cranial nerve deficit.  Skin: Skin is warm.  Psychiatric: She has a normal mood and affect. Her behavior is normal.  Nursing note and vitals reviewed.  Assessment and Plan:   Milta was seen today for establish care, nausea, emesis and diarrhea.  Diagnoses and all orders for this visit:  Diarrhea, unspecified type Comments: Concern for C. diff colitis based on history and exposure risk. Will start treatment while awaiting results.  Orders: -     CBC with Differential/Platelet -  Comprehensive metabolic panel -     TSH -     T4, free -     Lipase -     Magnesium -     metroNIDAZOLE (FLAGYL) 500 MG tablet; Take 1 tablet (500 mg total) by mouth 3 (three) times daily. -     Clostridium Difficile by PCR; Future -     Ova and parasite examination; Future -     Stool culture; Future  Palpitations Comments: EKG today without change. Orders: -     EKG 12-Lead  Memory difficulty Comments: Labs pending. Will monitor. Orders: -     B12  Diabetes mellitus without complication (HCC) Comments: Will maximize treatment once diarrhea resolves. Okay to hold Metformin for now. Orders: -     Hemoglobin A1c  AICD (automatic cardioverter/defibrillator) present Comments: Changeout on 09/03/16. Difficult wound to heal.   Congestive heart failure Comments: ECHO 08/13/2016 - Left ventricle: LVEF is severely depressed at approximately 15 to 20% The cavity size was severely dilated. Wall thickness was normal. - Aortic valve: There was trivial  regurgitation. - Mitral valve: There was mild regurgitation. - Left atrium: The atrium was mildly to moderately dilated.   . Reviewed expectations re: course of current medical issues. . Discussed self-management of symptoms. . Outlined signs and symptoms indicating need for more acute intervention. . Patient verbalized understanding and all questions were answered. Marland Kitchen Health Maintenance issues including appropriate healthy diet, exercise, and smoking avoidance were discussed with patient. . See orders for this visit as documented in the electronic medical record. . Patient received an After Visit Summary.  CMA served as Neurosurgeon during this visit. History, Physical, and Plan performed by medical provider. The above documentation has been reviewed and is accurate and complete. Helane Rima, D.O.  Helane Rima, DO [place] [place] [facility] 12/24/2016  Future Appointments Date Time Provider Department Center  12/30/2016 10:30 AM [facility] PA/NP [facility] None  01/18/2017 11:30 AM Helane Rima, DO [facility] None

## 2016-12-22 LAB — COMPREHENSIVE METABOLIC PANEL
ALT: 22 U/L (ref 0–35)
AST: 20 U/L (ref 0–37)
Albumin: 4.1 g/dL (ref 3.5–5.2)
Alkaline Phosphatase: 55 U/L (ref 39–117)
BUN: 9 mg/dL (ref 6–23)
CO2: 25 mEq/L (ref 19–32)
Calcium: 9.6 mg/dL (ref 8.4–10.5)
Chloride: 104 mEq/L (ref 96–112)
Creatinine, Ser: 0.68 mg/dL (ref 0.40–1.20)
GFR: 127.95 mL/min (ref >60.00)
Glucose, Bld: 183 mg/dL — ABNORMAL HIGH (ref 70–99)
Potassium: 4.1 mEq/L (ref 3.5–5.1)
Sodium: 135 mEq/L (ref 135–145)
Total Bilirubin: 0.5 mg/dL (ref 0.2–1.2)
Total Protein: 7.5 g/dL (ref 6.0–8.3)

## 2016-12-22 LAB — LIPASE: Lipase: 29 U/L (ref 11.0–59.0)

## 2016-12-22 LAB — CBC WITH DIFFERENTIAL/PLATELET
Basophils Absolute: 0.1 10*3/uL (ref 0.0–0.1)
Basophils Relative: 0.8 % (ref 0.0–3.0)
Eosinophils Absolute: 0.4 10*3/uL (ref 0.0–0.7)
Eosinophils Relative: 4.5 % (ref 0.0–5.0)
HCT: 41.1 % (ref 36.0–46.0)
Hemoglobin: 13.6 g/dL (ref 12.0–15.0)
Lymphocytes Relative: 36.4 % (ref 12.0–46.0)
Lymphs Abs: 3.3 10*3/uL (ref 0.7–4.0)
MCHC: 33 g/dL (ref 30.0–36.0)
MCV: 86.6 fl (ref 78.0–100.0)
Monocytes Absolute: 0.8 10*3/uL (ref 0.1–1.0)
Monocytes Relative: 9.5 % (ref 3.0–12.0)
Neutro Abs: 4.4 10*3/uL (ref 1.4–7.7)
Neutrophils Relative %: 48.8 % (ref 43.0–77.0)
Platelets: 266 10*3/uL (ref 150.0–400.0)
RBC: 4.74 Mil/uL (ref 3.87–5.11)
RDW: 12.9 % (ref 11.5–15.5)
WBC: 9 10*3/uL (ref 4.0–10.5)

## 2016-12-22 LAB — T4, FREE: Free T4: 1.04 ng/dL (ref 0.60–1.60)

## 2016-12-22 LAB — MAGNESIUM: Magnesium: 1.6 mg/dL (ref 1.5–2.5)

## 2016-12-22 LAB — TSH: TSH: 1.66 u[IU]/mL (ref 0.35–4.50)

## 2016-12-22 LAB — HEMOGLOBIN A1C: Hgb A1c MFr Bld: 9.2 % — ABNORMAL HIGH (ref 4.6–6.5)

## 2016-12-22 LAB — VITAMIN B12: Vitamin B-12: 368 pg/mL (ref 211–911)

## 2016-12-24 ENCOUNTER — Encounter: Payer: Self-pay | Admitting: Family Medicine

## 2016-12-30 ENCOUNTER — Inpatient Hospital Stay (HOSPITAL_COMMUNITY): Admission: RE | Admit: 2016-12-30 | Payer: PRIVATE HEALTH INSURANCE | Source: Ambulatory Visit

## 2017-01-04 ENCOUNTER — Encounter (HOSPITAL_COMMUNITY): Payer: Self-pay | Admitting: Cardiology

## 2017-01-04 ENCOUNTER — Ambulatory Visit (HOSPITAL_COMMUNITY)
Admission: RE | Admit: 2017-01-04 | Discharge: 2017-01-04 | Disposition: A | Discharge status: Home / Self Care | Payer: PRIVATE HEALTH INSURANCE | Source: Ambulatory Visit | Attending: Cardiology | Admitting: Cardiology

## 2017-01-04 VITALS — BP 152/98 | HR 98 | Wt 244.4 lb

## 2017-01-04 DIAGNOSIS — I4581 Long QT syndrome: Secondary | ICD-10-CM | POA: Insufficient documentation

## 2017-01-04 DIAGNOSIS — I428 Other cardiomyopathies: Secondary | ICD-10-CM

## 2017-01-04 DIAGNOSIS — Z7984 Long term (current) use of oral hypoglycemic drugs: Secondary | ICD-10-CM | POA: Diagnosis not present

## 2017-01-04 DIAGNOSIS — Z95810 Presence of automatic (implantable) cardiac defibrillator: Secondary | ICD-10-CM | POA: Diagnosis not present

## 2017-01-04 DIAGNOSIS — I10 Essential (primary) hypertension: Secondary | ICD-10-CM

## 2017-01-04 DIAGNOSIS — F17210 Nicotine dependence, cigarettes, uncomplicated: Secondary | ICD-10-CM | POA: Insufficient documentation

## 2017-01-04 DIAGNOSIS — I5022 Chronic systolic (congestive) heart failure: Secondary | ICD-10-CM | POA: Insufficient documentation

## 2017-01-04 DIAGNOSIS — I493 Ventricular premature depolarization: Secondary | ICD-10-CM | POA: Diagnosis not present

## 2017-01-04 DIAGNOSIS — E119 Type 2 diabetes mellitus without complications: Secondary | ICD-10-CM | POA: Insufficient documentation

## 2017-01-04 DIAGNOSIS — I110 Hypertensive heart disease with heart failure: Secondary | ICD-10-CM | POA: Diagnosis present

## 2017-01-04 DIAGNOSIS — Z9189 Other specified personal risk factors, not elsewhere classified: Secondary | ICD-10-CM

## 2017-01-04 DIAGNOSIS — R0602 Shortness of breath: Secondary | ICD-10-CM | POA: Insufficient documentation

## 2017-01-04 DIAGNOSIS — Z720 Tobacco use: Secondary | ICD-10-CM | POA: Diagnosis not present

## 2017-01-04 DIAGNOSIS — I517 Cardiomegaly: Secondary | ICD-10-CM | POA: Diagnosis not present

## 2017-01-04 MED ORDER — POTASSIUM CHLORIDE CRYS ER 20 MEQ PO TBCR: 20.0000 meq | EXTENDED_RELEASE_TABLET | Freq: Every day | ORAL | 3 refills | Status: DC

## 2017-01-04 MED ORDER — EPLERENONE 25 MG PO TABS: 25.0000 mg | ORAL_TABLET | Freq: Every day | ORAL | 6 refills | Status: AC

## 2017-01-04 NOTE — Progress Notes (Signed)
Advanced Heart Failure Medication Review by a Pharmacist  Does the patient  feel that his/her medications are working for him/her?  yes  Has the patient been experiencing any side effects to the medications prescribed?  no  Does the patient measure his/her own blood pressure or blood glucose at home?  no   Does the patient have any problems obtaining medications due to transportation or finances?   no  Understanding of regimen: good Understanding of indications: good Potential of compliance: good Patient understands to avoid NSAIDs. Patient understands to avoid decongestants.  Issues to address at subsequent visits: none.   Pharmacist comments: Kelly [name] is a pleasant 31 y/o female who presents with no medication list or bottles but with good recollection of her medication regimen. She endorses good adherence to her medications and had no medication-related questions or concerns for me at this time.   [name] PharmD Candidate   Time with patient: 10 minutes Preparation and documentation time: 10 minutes Total time: 20 minutes

## 2017-01-04 NOTE — Progress Notes (Signed)
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 Prior to relocation she was followed by Dr Jeannett Senior at [facility] in [place]. She has a history of chronic systolic heart failure diagnosed 2005, St Jude ICD, LHC 2005 no blockages, HTN, and DMII.   Evaluated [facility] ED 07/12/16 with CP. Troponin was negative and she was not thought to be overloaded. St Jude device interrogation showed fr equent PVCs and ERI. She was discharged with follow up to the HF clinic    Had ICD generator change in Feb. 2018.   She returns today for HF follow up. Weight up about 7 pounds in the last 6 months. She feels well overall, still working as an Char[employer] fundraiser, does get SOB sometimes when she works. Says she feels tired all the time, does not feel rested when she wakes up from a nights sleep. She does snore, never had a sleep study. She does not have an exercise routine, does feel SOB at times when she walks through the store. Feels SOB with showering on some days, has to give herself more time to get ready because she has to stop for breath. She has not been taking her Sherryll Burger, says it makes her nauseous, she was only nauseous with the 97/103 mg dose, not the 49/51mg . Says that in the past she has been on Inspra in the past, and she felt better than she has ever felt.    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    Past Medical History:  Diagnosis Date  . CHF (congestive heart failure) (HCC)   . Diabetes mellitus without complication (HCC)   . Hypertension   . ICD (implantable cardioverter-defibrillator) in place     Current Outpatient Prescriptions  Medication Sig Dispense Refill  . carvedilol (COREG) 12.5 MG  tablet Take 1 tablet (12.5 mg total) by mouth 2 (two) times daily with a meal. 30 tablet 1  . Magnesium Oxide 200 MG TABS Take 1 tablet (200 mg total) by mouth 2 (two) times daily. 60 tablet 6  . metFORMIN (GLUCOPHAGE) 500 MG tablet Take 1 tablet (500 mg total) by mouth 2 (two) times daily with a meal. 60 tablet 1  . ondansetron (ZOFRAN ODT) 4 MG disintegrating tablet Take 1 tablet (4 mg total) by mouth every 8 (eight) hours as needed for nausea or vomiting. 10 tablet 0  . potassium chloride SA (K-DUR,KLOR-CON) 20 MEQ tablet Take 1 tablet (20 mEq total) by mouth daily. 90 tablet 3  . eplerenone (INSPRA) 25 MG tablet Take 1 tablet (25 mg total) by mouth daily. 30 tablet 6   No current facility-administered medications for this encounter.     Allergies  Allergen Reactions  . Aldactone [Spironolactone] Shortness Of Breath  . Latex Rash      Social History   Social History  . Marital status: Single    Spouse name: N/A  . Number of children: N/A  . Years of education: N/A   Occupational History  .  Dedicated Nurses Associate   Social History Main Topics  . Smoking status: Current Every Day Smoker    Packs/day: 0.25    Types: Cigarettes  . Smokeless tobacco:  Never Used  . Alcohol use No     Comment: rarely  . Drug use: No  . Sexual activity: Yes    Birth control/ protection: Condom   Other Topics Concern  . Not on file   Social History Narrative  . No narrative on file      Family History  Problem Relation Age of Onset  . Mental illness Brother   . Diabetes Maternal Grandmother   . Heart disease Maternal Grandmother   . Hyperlipidemia Maternal Grandmother   . Hypertension Maternal Grandmother     Vitals:   01/04/17 1116  BP: (!) 152/98  Pulse: 98  SpO2: 96%  Weight: 244 lb 6.4 oz (110.9 kg)    PHYSICAL EXAM: General: Obese female, NAD. Walked into clinic without difficulty.  HEENT: Normal.  Neck: Supple, No JVD. Carotids 2+ bilat; no bruits. No  lymphadenopathy or thryomegaly appreciated. Cor: PMI nondisplaced, regular rate and rhythm. No rubs, gallops or murmurs. Lungs: Clear bilaterally. Normal effort.  Abdomen: Obese, soft, nontender, nondistended. No hepatosplenomegaly. No bruits or masses. Good bowel sounds. Extremities: no cyanosis, clubbing, rash. Trace pedal edema.  Neuro: alert & oriented x 3, cranial nerves grossly intact. moves all 4 extremities w/o difficulty. Affect pleasant.    ASSESSMENT & PLAN: 1. Chronic Systolic Heart Failure: NICM, normal cors in 2005. 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 Echo 07/2016 EF 15-20%, RV ok.  - NYHA III - Volume status stable on exam and confirmed with CorVue. Impedance high.  - She was intolerant to Entresto 97/103 mg BID, felt ok with 49/51mg  dosing. Will restart 49/51mg  dose and check BMET in 10 days.  - Continue Coreg 12.5mg  BID - She felt great when she took Inspra. Will start Inspra 25mg  daily. Discussed with HF PharmD. Will check BMET in 10 days. Previously intolerant to Cleda Daub (caused SOB).  - Will reduce her KCl to daily. Last K in early June was 4.1. But with addition of Inspra there is risk of hyperkalemia.  - Will set up for Echo this month.  - Consider CPX testing if EF not improved.  2. Frequent PVCs: - Saw [employer] in Feb. 2018. By device interrogation the PVC's appeared to be PAC's with aberrancy. Her A pacing was increased to 90 to suppress. This was re- evaluated at the time of her generator change, then she saw EP after in May 2018, PVC's felt to be stable and ablation vs AAD therapy could be considered in the future.   - EKG today with frequent PAC's with aberrancy. She will likely need AAD therapy. Will see EP in August.  3. St Jude ICD:  -s/p gen change in Feb. 2018 4. HTN - Hypertensive today, medication changes as above.  5. Current Smoker - Encouraged cessation. 6. Questionable sleep apnea - Will set up for sleep study.  She does snore, endorses hypersomnolence.   Follow up in 2 months.    Little Ishikawa, NP-C 12:01 PM

## 2017-01-04 NOTE — Patient Instructions (Addendum)
Will schedule you for an echocardiogram and lab appointment at [facility]. Address: [address] Floor), [place], [place] [address]  Phone: [phone]  __________________________________________________  __________________________________________________  Will schedule you for sleep study at [facility]. Address: [address] [place], [place], [place] [address] Phone: [phone]  ___________________________________________________  ___________________________________________________  Elisha Ponder to 49/51 mg twice daily. You CANNOT split your old tablets in half. You must pick up new Rx at [facility] pharmacy.  DECREASE Potassium to 20 meq (1 tab) once daily.  START Inspra 25 mg tablet once daily.  May return to work, letter to release included.  Follow up 2 months. Take all medication as prescribed the day of your appointment. Bring all medications with you to your appointment.  Do the following things EVERYDAY: 1) Weigh yourself in the morning before breakfast. Write it down and keep it in a log. 2) Take your medicines as prescribed 3) Eat low salt foods-Limit salt (sodium) to 2000 mg per day.  4) Stay as active as you can everyday 5) Limit all fluids for the day to less than 2 liters

## 2017-01-08 ENCOUNTER — Telehealth: Payer: Self-pay | Admitting: Family Medicine

## 2017-01-08 NOTE — Telephone Encounter (Signed)
Patient needs work absence letter faxed to [employer] ATTNEunice Blase Fax: [phone].  Thank you,  -LL

## 2017-01-11 NOTE — Telephone Encounter (Signed)
Letter faxed to number provided.

## 2017-01-18 ENCOUNTER — Ambulatory Visit: Payer: PRIVATE HEALTH INSURANCE | Admitting: Family Medicine

## 2017-01-18 DIAGNOSIS — Z0289 Encounter for other administrative examinations: Secondary | ICD-10-CM

## 2017-01-19 ENCOUNTER — Ambulatory Visit (INDEPENDENT_AMBULATORY_CARE_PROVIDER_SITE_OTHER): Payer: PRIVATE HEALTH INSURANCE | Admitting: Family Medicine

## 2017-01-19 ENCOUNTER — Encounter: Payer: Self-pay | Admitting: Family Medicine

## 2017-01-19 ENCOUNTER — Other Ambulatory Visit: Payer: Self-pay

## 2017-01-19 ENCOUNTER — Ambulatory Visit (HOSPITAL_COMMUNITY): Payer: PRIVATE HEALTH INSURANCE | Attending: Cardiovascular Disease

## 2017-01-19 VITALS — BP 130/78 | HR 89 | Temp 98.7°F | Ht 66.0 in | Wt 249.6 lb

## 2017-01-19 DIAGNOSIS — I5022 Chronic systolic (congestive) heart failure: Secondary | ICD-10-CM | POA: Diagnosis not present

## 2017-01-19 DIAGNOSIS — E119 Type 2 diabetes mellitus without complications: Secondary | ICD-10-CM | POA: Diagnosis not present

## 2017-01-19 DIAGNOSIS — Z6841 Body Mass Index (BMI) 40.0 and over, adult: Secondary | ICD-10-CM | POA: Diagnosis not present

## 2017-01-19 DIAGNOSIS — I420 Dilated cardiomyopathy: Secondary | ICD-10-CM | POA: Insufficient documentation

## 2017-01-19 DIAGNOSIS — F17210 Nicotine dependence, cigarettes, uncomplicated: Secondary | ICD-10-CM

## 2017-01-19 DIAGNOSIS — S8002XA Contusion of left knee, initial encounter: Secondary | ICD-10-CM

## 2017-01-19 DIAGNOSIS — I5030 Unspecified diastolic (congestive) heart failure: Secondary | ICD-10-CM | POA: Diagnosis not present

## 2017-01-19 MED ORDER — INSULIN PEN NEEDLE 31G X 6 MM MISC: 1 refills | Status: DC

## 2017-01-19 MED ORDER — LIRAGLUTIDE 18 MG/3ML ~~LOC~~ SOPN: PEN_INJECTOR | SUBCUTANEOUS | 6 refills | Status: DC

## 2017-01-19 MED ORDER — METFORMIN HCL 500 MG PO TABS: 500.0000 mg | ORAL_TABLET | Freq: Two times a day (BID) | ORAL | 1 refills | Status: DC

## 2017-01-19 NOTE — Addendum Note (Signed)
Addended by: Dorian Pod on: 01/19/2017 01:07 PM   Modules accepted: Orders

## 2017-01-19 NOTE — Progress Notes (Signed)
Kelly [name] is a 31 y.o. female is here for follow up.  History of Present Illness:   Insurance claims handler, CMA, acting as scribe for Dr. Earlene Plater.  HPI  Patient comes in today for follow up.  States she is doing well.  Diarrhea has cleared up.  She has pain in her left knee today.  States she hit it on a door frame at work.  Pain with bending knee.  She has been wrapping it in an Ace bandage when going to work for stability.  Slight swelling is visible.  No other complaints today.  Diabetes: Current symptoms: no polyuria or polydipsia, no chest pain, dyspnea or TIA's, no numbness, tingling or pain in extremities.  Taking medication compliantly without noted sided effects [x]   YES  []   NO  Home glucose monitoring in the range of  150-190.  Episodes of hypoglycemia? [x]   YES  []   NO Maintaining a diabetic diet? []   YES  [x]   NO Trying to exercise on a regular basis? []   YES  []   NO  On ACE inhibitor or angiotensin II receptor blocker? [x]   YES  []   NO On Aspirin? []   YES  [x]   NO  Lab Results  Component Value Date   HGBA1C 9.2 (H) 12/21/2016    No results found for: MICROALBUR, MALB24HUR  No results found for: CHOL, HDL, LDLCALC, LDLDIRECT, TRIG, CHOLHDL    Wt Readings from Last 3 Encounters:  01/19/17 249 lb 9.6 oz (113.2 kg)  01/04/17 244 lb 6.4 oz (110.9 kg)  12/21/16 245 lb 9.6 oz (111.4 kg)   Reports that she has been smoking Cigarettes.  She has been smoking about 0.25 packs per day. She has never used smokeless tobacco.  BP Readings from Last 3 Encounters:  01/19/17 130/78  01/04/17 (!) 152/98  12/21/16 132/84   Lab Results  Component Value Date   CREATININE 0.68 12/21/2016   Health Maintenance Due  Topic Date Due  . FOOT EXAM  09/20/1993  . OPHTHALMOLOGY EXAM  09/20/1993  . URINE MICROALBUMIN  09/20/1993  . PNEUMOCOCCAL POLYSACCHARIDE VACCINE (2) 07/17/2014   PMHx, SurgHx, SocialHx, FamHx, Medications, and Allergies were reviewed in the Visit Navigator and  updated as appropriate.   Patient Active Problem List   Diagnosis Date Noted  . Congestive heart failure (HCC) 12/21/2016  . Memory difficulty 12/21/2016  . BMI 40.0-44.9, adult (HCC) 03/29/2015  . History of PCOS 03/29/2015  . Nicotine dependence 03/29/2015  . Dry eyes 02/15/2014  . AICD (automatic cardioverter/defibrillator) present 06/17/2013  . Cardiomyopathy, dilated, nonischemic (HCC) 06/17/2013  . Hypertension 12/21/2011  . Diabetes mellitus (HCC) 05/13/2010   Social History  Substance Use Topics  . Smoking status: Current Every Day Smoker    Packs/day: 0.25    Types: Cigarettes  . Smokeless tobacco: Never Used  . Alcohol use No     Comment: rarely   Current Medications and Allergies:   .  carvedilol (COREG) 12.5 MG tablet, Take 1 tablet (12.5 mg total) by mouth 2 (two) times daily with a meal., Disp: 30 tablet, Rfl: 1 .  eplerenone (INSPRA) 25 MG tablet, Take 1 tablet (25 mg total) by mouth daily., Disp: 30 tablet, Rfl: 6 .  Magnesium Oxide 200 MG TABS, Take 1 tablet (200 mg total) by mouth 2 (two) times daily., Disp: 60 tablet, Rfl: 6 .  metFORMIN (GLUCOPHAGE) 500 MG tablet, Take 1 tablet (500 mg total) by mouth 2 (two) times daily with a meal., Disp:  60 tablet, Rfl: 1 .  ondansetron (ZOFRAN ODT) 4 MG disintegrating tablet, Take 1 tablet (4 mg total) by mouth every 8 (eight) hours as needed for nausea or vomiting., Disp: 10 tablet, Rfl: 0 .  potassium chloride SA (K-DUR,KLOR-CON) 20 MEQ tablet, Take 1 tablet (20 mEq total) by mouth daily. (Patient taking differently: Take 20 mEq by mouth 2 (two) times daily. ), Disp: 90 tablet, Rfl: 3  Allergies  Allergen Reactions  . Aldactone [Spironolactone] Shortness Of Breath  . Latex Rash   Review of Systems   Review of Systems  All other systems reviewed and are negative.  Vitals:   Vitals:   01/19/17 1053  BP: 130/78  Pulse: 89  Temp: 98.7 F (37.1 C)  TempSrc: Oral  SpO2: 98%  Weight: 249 lb 9.6 oz (113.2 kg)    Height: 5\' 6"  (1.676 m)     Body mass index is 40.29 kg/m.   Physical Exam:   Physical Exam  Constitutional: She appears well-developed and well-nourished. No distress.  HENT:  Head: Normocephalic and atraumatic.  Right Ear: External ear normal.  Left Ear: External ear normal.  Nose: Nose normal.  Mouth/Throat: Oropharynx is clear and moist.  Eyes: EOM are normal. Pupils are equal, round, and reactive to light.  Neck: Normal range of motion. Neck supple.  Cardiovascular: Normal rate, regular rhythm, normal heart sounds and intact distal pulses.   Pulmonary/Chest: Effort normal and breath sounds normal.  Abdominal: Soft. Bowel sounds are normal.  Skin: Skin is warm and dry.  Psychiatric: She has a normal mood and affect. Her behavior is normal.  Nursing note and vitals reviewed.   Diabetic Foot Exam - Simple   Simple Foot Form Diabetic Foot exam was performed with the following findings:  Yes 01/19/2017 11:48 AM  Visual Inspection No deformities, no ulcerations, no other skin breakdown bilaterally:  Yes Sensation Testing Intact to touch and monofilament testing bilaterally:  Yes Pulse Check Posterior Tibialis and Dorsalis pulse intact bilaterally:  Yes Comments     Results for orders placed or performed in visit on 12/21/16  CBC with Differential/Platelet  Result Value Ref Range   WBC 9.0 4.0 - 10.5 K/uL   RBC 4.74 3.87 - 5.11 Mil/uL   Hemoglobin 13.6 12.0 - 15.0 g/dL   HCT 37.4 82.7 - 07.8 %   MCV 86.6 78.0 - 100.0 fl   MCHC 33.0 30.0 - 36.0 g/dL   RDW 67.5 44.9 - 20.1 %   Platelets 266.0 150.0 - 400.0 K/uL   Neutrophils Relative % 48.8 43.0 - 77.0 %   Lymphocytes Relative 36.4 12.0 - 46.0 %   Monocytes Relative 9.5 3.0 - 12.0 %   Eosinophils Relative 4.5 0.0 - 5.0 %   Basophils Relative 0.8 0.0 - 3.0 %   Neutro Abs 4.4 1.4 - 7.7 K/uL   Lymphs Abs 3.3 0.7 - 4.0 K/uL   Monocytes Absolute 0.8 0.1 - 1.0 K/uL   Eosinophils Absolute 0.4 0.0 - 0.7 K/uL   Basophils  Absolute 0.1 0.0 - 0.1 K/uL  Comprehensive metabolic panel  Result Value Ref Range   Sodium 135 135 - 145 mEq/L   Potassium 4.1 3.5 - 5.1 mEq/L   Chloride 104 96 - 112 mEq/L   CO2 25 19 - 32 mEq/L   Glucose, Bld 183 (H) 70 - 99 mg/dL   BUN 9 6 - 23 mg/dL   Creatinine, Ser 0.07 0.40 - 1.20 mg/dL   Total Bilirubin 0.5 0.2 - 1.2  mg/dL   Alkaline Phosphatase 55 39 - 117 U/L   AST 20 0 - 37 U/L   ALT 22 0 - 35 U/L   Total Protein 7.5 6.0 - 8.3 g/dL   Albumin 4.1 3.5 - 5.2 g/dL   Calcium 9.6 8.4 - 16.1 mg/dL   GFR 096.04 >54.09 mL/min  TSH  Result Value Ref Range   TSH 1.66 0.35 - 4.50 uIU/mL  T4, free  Result Value Ref Range   Free T4 1.04 0.60 - 1.60 ng/dL  Hemoglobin W1X  Result Value Ref Range   Hgb A1c MFr Bld 9.2 (H) 4.6 - 6.5 %  B12  Result Value Ref Range   Vitamin B-12 368 211 - 911 pg/mL  Lipase  Result Value Ref Range   Lipase 29.0 11.0 - 59.0 U/L  Magnesium  Result Value Ref Range   Magnesium 1.6 1.5 - 2.5 mg/dL   Assessment and Plan:   Kelly [name] was seen today for follow-up.  Diagnoses and all orders for this visit:  Contusion of left knee, initial encounter Comments: No red flags. Continue wrapping and using ice prn. Expect resolution within 4-6 weeks. If not, to [place] +/- XRAY.  Type 2 diabetes mellitus without complication, without long-term current use of insulin (HCC) Comments: Will add Victoza to treatment regimen. Expectations, risks, and potential side effects reviewed. Will recheck labs and weight in 3 months. Orders: -     liraglutide 18 MG/3ML SOPN; Start with the lowest (0.6) setting. After a few days, increase to 1.2. If you have no nausea, increase to the highest (1.8) setting. -     metFORMIN (GLUCOPHAGE) 500 MG tablet; Take 1 tablet (500 mg total) by mouth 2 (two) times daily with a meal.  BMI 40.0-44.9, adult (HCC) Comments: The patient is asked to make an attempt to improve diet and exercise patterns to aid in medical management of  this problem.  Cigarette nicotine dependence without complication Comments: I advised patient to quit smoking, and offered support. [place] QUITLINE: 1-800-QUIT-NOW ([phone]).    . Reviewed expectations re: course of current medical issues. . Discussed self-management of symptoms. . Outlined signs and symptoms indicating need for more acute intervention. . Patient verbalized understanding and all questions were answered. Marland Kitchen Health Maintenance issues including appropriate healthy diet, exercise, and smoking avoidance were discussed with patient. . See orders for this visit as documented in the electronic medical record. . Patient received an After Visit Summary.  CMA served as Neurosurgeon during this visit. History, Physical, and Plan performed by medical provider. The above documentation has been reviewed and is accurate and complete. Helane Rima, D.O.  Helane Rima, DO [place] 01/19/2017  Future Appointments Date Time Provider Department Center  02/21/2017 8:00 PM [facility] 1 [facility]  03/08/2017 9:00 AM [facility] PA/NP [facility] None  03/15/2017 9:15 AM Hillis Range, MD [facility]  04/21/2017 11:30 AM Helane Rima, DO [facility] None

## 2017-01-22 ENCOUNTER — Telehealth (HOSPITAL_COMMUNITY): Payer: Self-pay | Admitting: Vascular Surgery

## 2017-01-22 NOTE — Telephone Encounter (Signed)
Patient aware of echo results.

## 2017-01-22 NOTE — Telephone Encounter (Signed)
Pt returned a call to Chantel about test results please advise

## 2017-01-25 ENCOUNTER — Other Ambulatory Visit: Payer: Self-pay | Admitting: Internal Medicine

## 2017-02-21 ENCOUNTER — Ambulatory Visit (HOSPITAL_BASED_OUTPATIENT_CLINIC_OR_DEPARTMENT_OTHER): Payer: PRIVATE HEALTH INSURANCE | Attending: Cardiology | Admitting: Cardiovascular Disease

## 2017-02-21 VITALS — Ht 66.0 in | Wt 240.0 lb

## 2017-02-21 DIAGNOSIS — Z79899 Other long term (current) drug therapy: Secondary | ICD-10-CM | POA: Insufficient documentation

## 2017-02-21 DIAGNOSIS — R008 Other abnormalities of heart beat: Secondary | ICD-10-CM | POA: Diagnosis not present

## 2017-02-21 DIAGNOSIS — R0683 Snoring: Secondary | ICD-10-CM | POA: Diagnosis not present

## 2017-02-21 DIAGNOSIS — I509 Heart failure, unspecified: Secondary | ICD-10-CM | POA: Insufficient documentation

## 2017-02-21 DIAGNOSIS — I493 Ventricular premature depolarization: Secondary | ICD-10-CM

## 2017-02-21 DIAGNOSIS — I5022 Chronic systolic (congestive) heart failure: Secondary | ICD-10-CM | POA: Diagnosis not present

## 2017-02-21 DIAGNOSIS — G4719 Other hypersomnia: Secondary | ICD-10-CM

## 2017-02-21 DIAGNOSIS — Z7984 Long term (current) use of oral hypoglycemic drugs: Secondary | ICD-10-CM | POA: Insufficient documentation

## 2017-02-21 DIAGNOSIS — Z950 Presence of cardiac pacemaker: Secondary | ICD-10-CM | POA: Insufficient documentation

## 2017-03-08 ENCOUNTER — Encounter (HOSPITAL_COMMUNITY): Payer: PRIVATE HEALTH INSURANCE

## 2017-03-15 ENCOUNTER — Encounter: Payer: Self-pay | Admitting: Internal Medicine

## 2017-03-15 ENCOUNTER — Ambulatory Visit (INDEPENDENT_AMBULATORY_CARE_PROVIDER_SITE_OTHER): Payer: PRIVATE HEALTH INSURANCE | Admitting: Internal Medicine

## 2017-03-15 VITALS — BP 128/84 | HR 62 | Ht 66.0 in | Wt 251.0 lb

## 2017-03-15 DIAGNOSIS — I5022 Chronic systolic (congestive) heart failure: Secondary | ICD-10-CM

## 2017-03-15 DIAGNOSIS — I493 Ventricular premature depolarization: Secondary | ICD-10-CM | POA: Diagnosis not present

## 2017-03-15 DIAGNOSIS — I420 Dilated cardiomyopathy: Secondary | ICD-10-CM

## 2017-03-15 LAB — CUP PACEART INCLINIC DEVICE CHECK
Battery Remaining Longevity: 93 mo
Brady Statistic RA Percent Paced: 0 %
Brady Statistic RV Percent Paced: 0.05 %
Date Time Interrogation Session: 20180827095837
HIGH POWER IMPEDANCE MEASURED VALUE: 70.5484
Implantable Lead Implant Date: 20090114
Implantable Lead Implant Date: 20090114
Implantable Lead Location: 753860
Lead Channel Impedance Value: 437.5 Ohm
Lead Channel Pacing Threshold Amplitude: 0.5 V
Lead Channel Pacing Threshold Amplitude: 0.75 V
Lead Channel Pacing Threshold Pulse Width: 0.5 ms
Lead Channel Pacing Threshold Pulse Width: 0.5 ms
Lead Channel Setting Sensing Sensitivity: 0.5 mV
MDC IDC LEAD LOCATION: 753859
MDC IDC MSMT LEADCHNL RA IMPEDANCE VALUE: 350 Ohm
MDC IDC MSMT LEADCHNL RA PACING THRESHOLD AMPLITUDE: 0.5 V
MDC IDC MSMT LEADCHNL RA SENSING INTR AMPL: 4.7 mV
MDC IDC MSMT LEADCHNL RV PACING THRESHOLD AMPLITUDE: 0.75 V
MDC IDC MSMT LEADCHNL RV PACING THRESHOLD PULSEWIDTH: 0.5 ms
MDC IDC MSMT LEADCHNL RV PACING THRESHOLD PULSEWIDTH: 0.5 ms
MDC IDC MSMT LEADCHNL RV SENSING INTR AMPL: 12 mV
MDC IDC PG IMPLANT DT: 20180215
MDC IDC SET LEADCHNL RA PACING AMPLITUDE: 2 V
MDC IDC SET LEADCHNL RV PACING AMPLITUDE: 2.5 V
MDC IDC SET LEADCHNL RV PACING PULSEWIDTH: 0.5 ms
Pulse Gen Serial Number: 7337343

## 2017-03-15 NOTE — Progress Notes (Signed)
 Electrophysiology Office Note   Date:  03/15/2017   ID:  Kelly [name], DOB 11/12/1985, MRN [id number]  PCP:  Wallace, Erica, DO  Cardiologist:  Bensimhon Primary Electrophysiologist: Davyn Elsasser, MD    Chief Complaint  Patient presents with  . Follow-up    Chronic systolic CHF     History of Present Illness: Kelly [name] is a 31 y.o. female who presents today for electrophysiology evaluation.   The patient has nonischemic CM.  She has very frequent PVCs (bigeminal) which may be contributing.  She has occasional fatigue.  Mostly feels well currently.  Today, she denies symptoms of palpitations, chest pain, shortness of breath, orthopnea, PND, lower extremity edema, claudication, dizziness, presyncope, syncope, bleeding, or neurologic sequela. The patient is tolerating medications without difficulties and is otherwise without complaint today.    Past Medical History:  Diagnosis Date  . CHF (congestive heart failure) (HCC)   . Diabetes mellitus without complication (HCC)   . Hypertension   . ICD (implantable cardioverter-defibrillator) in place    Past Surgical History:  Procedure Laterality Date  . CARDIAC DEFIBRILLATOR PLACEMENT    . ICD GENERATOR CHANGEOUT N/A 09/03/2016   Procedure: ICD Generator Changeout;  Surgeon: Bali Lyn, MD;  [facility];  Service: Cardiovascular;  Laterality: N/A;     Current Outpatient Prescriptions  Medication Sig Dispense Refill  . carvedilol (COREG) 12.5 MG tablet Take 1 tablet (12.5 mg total) by mouth 2 (two) times daily with a meal. 30 tablet 1  . eplerenone (INSPRA) 25 MG tablet Take 1 tablet (25 mg total) by mouth daily. 30 tablet 6  . Magnesium Oxide 200 MG TABS Take 1 tablet (200 mg total) by mouth 2 (two) times daily. 60 tablet 6  . metFORMIN (GLUCOPHAGE) 500 MG tablet Take 1 tablet (500 mg total) by mouth 2 (two) times daily with a meal. 60 tablet 1  . ondansetron (ZOFRAN ODT) 4 MG disintegrating  tablet Take 1 tablet (4 mg total) by mouth every 8 (eight) hours as needed for nausea or vomiting. 10 tablet 0  . potassium chloride SA (K-DUR,KLOR-CON) 20 MEQ tablet Take 1 tablet by mouth in the morning and 2 tablets in the evening     No current facility-administered medications for this visit.     Allergies:   Aldactone [spironolactone] and Latex   Social History:  The patient  reports that she has been smoking Cigarettes.  She has been smoking about 0.25 packs per day. She has never used smokeless tobacco. She reports that she does not drink alcohol or use drugs.   Family History:  The patient's  family history includes Diabetes in her maternal grandmother; Heart disease in her maternal grandmother; Hyperlipidemia in her maternal grandmother; Hypertension in her maternal grandmother; Mental illness in her brother.    ROS:  Please see the history of present illness.   All other systems are personally reviewed and negative.    PHYSICAL EXAM: VS:  BP 128/84   Pulse 62   Ht 5' 6" (1.676 m)   Wt 251 lb (113.9 kg)   SpO2 98%   BMI 40.51 kg/m  , BMI Body mass index is 40.51 kg/m. GEN: overweight, in no acute distress  HEENT: normal  Neck: no JVD, carotid bruits, or masses Cardiac: RRR with very frequent ectopy in bigeminy; no murmurs, rubs, or gallops,no edema  Respiratory:  clear to auscultation bilaterally, normal work of breathing GI: soft, nontender, nondistended, + BS MS: no deformity   or atrophy  Skin: warm and dry  Neuro:  Strength and sensation are intact Psych: euthymic mood, full affect    Recent Labs: 12/12/2016: B Natriuretic Peptide 70.1 12/21/2016: ALT 22; BUN 9; Creatinine, Ser 0.68; Hemoglobin 13.6; Magnesium 1.6; Platelets 266.0; Potassium 4.1; Sodium 135; TSH 1.66  personally reviewed   Lipid Panel  No results found for: CHOL, TRIG, HDL, CHOLHDL, VLDL, LDLCALC, LDLDIRECT personally reviewed   Wt Readings from Last 3 Encounters:  03/15/17 251 lb (113.9 kg)    02/21/17 240 lb (108.9 kg)  01/19/17 249 lb 9.6 oz (113.2 kg)      Other studies personally reviewed: Additional studies/ records that were reviewed today include: CHF clinic notes, EP notes, prior ekgs  Review of the above records today demonstrates: as above   ASSESSMENT AND PLAN:  1.  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  Given compliance with EP follow-up, I would not advise AAD therapy. Therapeutic strategies for PVCs including medicine and ablation were discussed in detail with the patient today. Risk, benefits, and alternatives to EP study and radiofrequency ablation were also discussed in detail today. These risks include but are not limited to stroke, bleeding, vascular damage, tamponade, perforation, damage to the heart and other structures, AV block requiring pacemaker, worsening renal function, ICd lead dislodgement, and death. The patient understands these risk and wishes to proceed.  We will therefore proceed with catheter ablation at the next available time.  Carto, ICE, and anesthesia are requested for the procedure  2. Nonischemic CM/ chronic systolic dysfunction Stable Hopefully will improve with PVC supression ICD interrogation today is reviewed and normal Normal ICD function See Pace Art report No changes today ICD pocket has healed   Current medicines are reviewed at length with the patient today.   The patient does not have concerns regarding her medicines.  The following changes were made today:  none    Signed, Hillis Range, MD  03/15/2017 9:49 AM     [facility] [address] [place] [address] [phone] (office) [phone] (fax)

## 2017-03-15 NOTE — Addendum Note (Signed)
Addended by: Dennis Bast F on: 03/15/2017 10:12 AM   Modules accepted: Orders

## 2017-03-15 NOTE — Patient Instructions (Addendum)
Medication Instructions:  Your physician recommends that you continue on your current medications as directed. Please refer to the Current Medication list given to you today.   Labwork: Your physician recommends that you return for lab work on: 03/29/17---you do not have to fast for the labs   Testing/Procedures:  Your physician has recommended that you have an ablation. Catheter ablation is a medical procedure used to treat some cardiac arrhythmias (irregular heartbeats). During catheter ablation, a long, thin, flexible tube is put into a blood vessel in your groin (upper thigh), or neck. This tube is called an ablation catheter. It is then guided to your heart through the blood vessel. Radio frequency waves destroy small areas of heart tissue where abnormal heartbeats may cause an arrhythmia to start. Please see the instruction sheet given to you today.---04/06/17  Please arrive at [facility] Entrance of [facility] at 5:30am Do not eat or drink after midnight the night prior to the procedure Do not take any medications the morning of the test----HOLD Carvedilol for 36 hours prior to ablation.  Do not take Carvedilol after am dose on 916/18.   Plan for one night stay Will need someone to drive you home at discharge    Follow-Up: Your physician recommends that you schedule a follow-up appointment in: 4 weeks from 04/06/17 with [name]  Thank you for choosing [facility]!!     Dennis Bast, RN [phone]

## 2017-03-20 NOTE — Procedures (Signed)
Patient Name: Kelly, [name] Date: 02/21/2017 Gender: Female D.O.B: 1986/06/18 Age (years): 13 Referring Provider: Suzzette Righter NP Height (inches): 66 Interpreting Physician: Nicki Guadalajara MD, ABSM Weight (lbs): 240 RPSGT: Armen Pickup BMI: 39 MRN: [id number] Neck Size: 17.50  CLINICAL INFORMATION Sleep Study Type: NPSG  Indication for sleep study: Congestive Heart Failure  Epworth Sleepiness Score: 12  SLEEP STUDY TECHNIQUE As per the AASM Manual for the Scoring of Sleep and Associated Events v2.3 (April 2016) with a hypopnea requiring 4% desaturations.  The channels recorded and monitored were frontal, central and occipital EEG, electrooculogram (EOG), submentalis EMG (chin), nasal and oral airflow, thoracic and abdominal wall motion, anterior tibialis EMG, snore microphone, electrocardiogram, and pulse oximetry.  MEDICATIONS *    carvedilol (COREG) 12.5 MG tablet  Take 1 tablet (12.5 mg total) by mouth 2 (two) times daily with a meal.    *    eplerenone (INSPRA) 25 MG tablet  Take 1 tablet (25 mg total) by mouth daily.     *    Magnesium Oxide 200 MG TABS   Take 1 tablet (200 mg total) by mouth 2 (two) times daily.     *    metFORMIN (GLUCOPHAGE) 500 MG tablet  Take 1 tablet (500 mg total) by mouth 2 (two) times daily with a meal.    *    ondansetron (ZOFRAN ODT) 4 MG disintegrating tablet  Take 1 tablet (4 mg total) by mouth every 8 (eight) hours PRN for nausea or vomiting.     *    potassium chloride SA (K-DUR,KLOR-CON) 20 MEQ tablet  Take 1 tablet by mouth in the morning and 2 tablets in the evening          Medications self-administered by patient taken the night of the study : N/A  SLEEP ARCHITECTURE The study was initiated at 10:33:19 PM and ended at 5:10:13 AM.  Sleep onset time was 201.2 minutes and the sleep efficiency was 48.9%. The total sleep time was 194.0 minutes.  Stage REM latency was 53.0 minutes.  The patient spent 0.52% of the night in  stage N1 sleep, 65.72% in stage N2 sleep, 0.77% in stage N3 and 32.99% in REM.  Alpha intrusion was absent.  Supine sleep was 54.27%.  RESPIRATORY PARAMETERS The overall apnea/hypopnea index (AHI) was 2.2 per hour. There were 0 total apneas, including 0 obstructive, 0 central and 0 mixed apneas. There were 7 hypopneas and 18 RERAs.  The AHI during Stage REM sleep was 4.7 per hour.  AHI while supine was 1.1 per hour.  The mean oxygen saturation was 95.51%. The minimum SpO2 during sleep was 89.00%.  Soft snoring was noted during this study.  CARDIAC DATA The 2 lead EKG demonstrated pacemaker generated. The mean heart rate was 77.16 beats per minute. Other EKG findings include: Frequent  PVCs with bigeminy and trigeminy.  LEG MOVEMENT DATA The total PLMS were 0 with a resulting PLMS index of 0.00. Associated arousal with leg movement index was 0.0 .  IMPRESSIONS - No significant obstructive sleep apnea occurred during this study (AHI = 2.2/h). - No significant central sleep apnea occurred during this study (CAI = 0.0/h). - The patient had minimal or no oxygen desaturation during the study (Min O2 = 89.00%). - Normal sleep architecture but significantly reduced sleep efficiency at only 48.9% - The patient snored with Soft snoring volume. - EKG findings include frequent PVCs with episodes of bigeminy and trigeminy. - Clinically significant periodic limb  movements did not occur during sleep. No significant associated arousals.  DIAGNOSIS - Mild snoring - Frequent ventricular ectopy with PVCs with bigeminy and trigeminy - Excessive daytime sleepiness  RECOMMENDATIONS - At present, there is no indication for CPAP therapy or evidence of significant sleep disordered breathing. - With mild snoring efforts should be made to optimize nasal and oropharyngeal patency. - Avoid alcohol, sedatives and other CNS depressants that may worsen sleep apnea and disrupt normal sleep architecture. -  Sleep hygiene should be reviewed to assess factors that may improve sleep quality and duration. - Weight management and regular exercise should be initiated or continued if appropriate.  [Electronically signed] 03/20/2017 04:40 PM  Nicki Guadalajara MD, [facility], ABSM Diplomate, American Board of Sleep Medicine  NPI: [id number] [place] [facility] PH: [phone]   FX: [phone] ACCREDITED BY THE AMERICAN ACADEMY OF SLEEP MEDICINE

## 2017-03-21 ENCOUNTER — Encounter: Payer: Self-pay | Admitting: Family Medicine

## 2017-03-21 DIAGNOSIS — G4719 Other hypersomnia: Secondary | ICD-10-CM | POA: Insufficient documentation

## 2017-03-25 ENCOUNTER — Telehealth: Payer: Self-pay | Admitting: *Deleted

## 2017-03-25 NOTE — Telephone Encounter (Signed)
Patient aware and verbalized understanding. °

## 2017-03-25 NOTE — Telephone Encounter (Signed)
-----   Message from Lennette Bihari, MD sent at 03/20/2017  4:48 PM EDT ----- Kelly [name]; please notify the patient that she did not have significant sleep-disordered breathing.  No indication for CPAP therapy.  There is excessive daytime sleepiness.  Improve sleep hygiene and sleep duration.  Frequent PVCs were noted throughout the sleep study

## 2017-03-25 NOTE — Telephone Encounter (Signed)
Follow up ° ° ° ° ° °Returning a call to the nurse °

## 2017-03-25 NOTE — Telephone Encounter (Signed)
Left message to call back  

## 2017-03-29 ENCOUNTER — Other Ambulatory Visit: Payer: PRIVATE HEALTH INSURANCE

## 2017-04-01 ENCOUNTER — Other Ambulatory Visit: Payer: PRIVATE HEALTH INSURANCE | Admitting: *Deleted

## 2017-04-01 LAB — BETA HCG QUANT (REF LAB): hCG Quant: <1 m[IU]/mL

## 2017-04-01 NOTE — Progress Notes (Unsigned)
sprg

## 2017-04-02 LAB — BASIC METABOLIC PANEL
BUN/Creatinine Ratio: 12 (ref 9–23)
BUN: 8 mg/dL (ref 6–20)
CALCIUM: 9.3 mg/dL (ref 8.7–10.2)
CHLORIDE: 100 mmol/L (ref 96–106)
CO2: 24 mmol/L (ref 20–29)
Creatinine, Ser: 0.69 mg/dL (ref 0.57–1.00)
GFR calc non Af Amer: 115 mL/min/{1.73_m2} (ref >59)
GFR, EST AFRICAN AMERICAN: 132 mL/min/{1.73_m2} (ref >59)
Glucose: 263 mg/dL — ABNORMAL HIGH (ref 65–99)
Potassium: 4.3 mmol/L (ref 3.5–5.2)
Sodium: 139 mmol/L (ref 134–144)

## 2017-04-02 LAB — CBC WITH DIFFERENTIAL/PLATELET
Basophils Absolute: 0.1 10*3/uL (ref 0.0–0.2)
Basos: 1 %
EOS (ABSOLUTE): 0.4 10*3/uL (ref 0.0–0.4)
Eos: 4 %
Hematocrit: 40.9 % (ref 34.0–46.6)
Hemoglobin: 13.6 g/dL (ref 11.1–15.9)
IMMATURE GRANS (ABS): 0 10*3/uL (ref 0.0–0.1)
IMMATURE GRANULOCYTES: 0 %
LYMPHS: 45 %
Lymphocytes Absolute: 3.9 10*3/uL — ABNORMAL HIGH (ref 0.7–3.1)
MCH: 29.3 pg (ref 26.6–33.0)
MCHC: 33.3 g/dL (ref 31.5–35.7)
MCV: 88 fL (ref 79–97)
MONOCYTES: 9 %
Monocytes Absolute: 0.8 10*3/uL (ref 0.1–0.9)
Neutrophils Absolute: 3.7 10*3/uL (ref 1.4–7.0)
Neutrophils: 41 %
PLATELETS: 261 10*3/uL (ref 150–379)
RBC: 4.64 x10E6/uL (ref 3.77–5.28)
RDW: 13.7 % (ref 12.3–15.4)
WBC: 8.8 10*3/uL (ref 3.4–10.8)

## 2017-04-05 NOTE — Anesthesia Preprocedure Evaluation (Addendum)
Anesthesia Evaluation  Patient identified by MRN, date of birth, ID band Patient awake    Reviewed: Allergy & Precautions, NPO status , Patient's Chart, lab work & pertinent test results  History of Anesthesia Complications Negative for: history of anesthetic complications  Airway Mallampati: III  TM Distance: >3 FB Neck ROM: Full    Dental no notable dental hx. (+) Dental Advisory Given, Teeth Intact,    Pulmonary neg pulmonary ROS, Current Smoker,    Pulmonary exam normal        Cardiovascular hypertension, +CHF  Normal cardiovascular exam+ dysrhythmias + Cardiac Defibrillator   Study Conclusions  - Left ventricle: The cavity size was severely dilated. Systolic   function was severely reduced. The estimated ejection fraction   was in the range of 25% to 30%.   Neuro/Psych negative neurological ROS  negative psych ROS   GI/Hepatic negative GI ROS, Neg liver ROS,   Endo/Other  diabetesMorbid obesity  Renal/GU negative Renal ROS     Musculoskeletal negative musculoskeletal ROS (+)   Abdominal   Peds  Hematology negative hematology ROS (+)   Anesthesia Other Findings Day of surgery medications reviewed with the patient.  Reproductive/Obstetrics                           Anesthesia Physical Anesthesia Plan  ASA: III  Anesthesia Plan: MAC   Post-op Pain Management:    Induction: Intravenous  PONV Risk Score and Plan: 2 and Ondansetron and Dexamethasone  Airway Management Planned: Natural Airway  Additional Equipment:   Intra-op Plan:   Post-operative Plan:   Informed Consent: I have reviewed the patients History and Physical, chart, labs and discussed the procedure including the risks, benefits and alternatives for the proposed anesthesia with the patient or authorized representative who has indicated his/her understanding and acceptance.   Dental advisory given  Plan  Discussed with: CRNA and Anesthesiologist  Anesthesia Plan Comments:       Anesthesia Quick Evaluation

## 2017-04-06 ENCOUNTER — Encounter (HOSPITAL_COMMUNITY): Payer: Self-pay | Admitting: Certified Registered Nurse Anesthetist

## 2017-04-06 ENCOUNTER — Ambulatory Visit (HOSPITAL_COMMUNITY): Payer: Self-pay | Admitting: Anesthesiology

## 2017-04-06 ENCOUNTER — Ambulatory Visit (HOSPITAL_COMMUNITY)
Admission: RE | Admit: 2017-04-06 | Discharge: 2017-04-07 | Disposition: A | Discharge status: Home / Self Care | Payer: PRIVATE HEALTH INSURANCE | Source: Ambulatory Visit | Attending: Internal Medicine | Admitting: Internal Medicine

## 2017-04-06 ENCOUNTER — Encounter (HOSPITAL_COMMUNITY)
Admission: RE | Disposition: A | Discharge status: Home / Self Care | Payer: Self-pay | Source: Ambulatory Visit | Attending: Internal Medicine

## 2017-04-06 DIAGNOSIS — I493 Ventricular premature depolarization: Secondary | ICD-10-CM | POA: Diagnosis present

## 2017-04-06 DIAGNOSIS — Z6841 Body Mass Index (BMI) 40.0 and over, adult: Secondary | ICD-10-CM | POA: Insufficient documentation

## 2017-04-06 DIAGNOSIS — Z7984 Long term (current) use of oral hypoglycemic drugs: Secondary | ICD-10-CM | POA: Insufficient documentation

## 2017-04-06 DIAGNOSIS — E119 Type 2 diabetes mellitus without complications: Secondary | ICD-10-CM | POA: Insufficient documentation

## 2017-04-06 DIAGNOSIS — I429 Cardiomyopathy, unspecified: Secondary | ICD-10-CM | POA: Insufficient documentation

## 2017-04-06 DIAGNOSIS — I472 Ventricular tachycardia: Secondary | ICD-10-CM | POA: Diagnosis not present

## 2017-04-06 DIAGNOSIS — I5022 Chronic systolic (congestive) heart failure: Secondary | ICD-10-CM | POA: Insufficient documentation

## 2017-04-06 DIAGNOSIS — F17210 Nicotine dependence, cigarettes, uncomplicated: Secondary | ICD-10-CM | POA: Insufficient documentation

## 2017-04-06 DIAGNOSIS — I110 Hypertensive heart disease with heart failure: Secondary | ICD-10-CM | POA: Insufficient documentation

## 2017-04-06 HISTORY — DX: Other chronic pain: G89.29

## 2017-04-06 HISTORY — DX: Low back pain: M54.5

## 2017-04-06 HISTORY — DX: Adverse effect of unspecified anesthetic, initial encounter: T41.45XA

## 2017-04-06 HISTORY — DX: Presence of automatic (implantable) cardiac defibrillator: Z95.810

## 2017-04-06 HISTORY — PX: PVC ABLATION: EP1236

## 2017-04-06 HISTORY — DX: Pneumonia, unspecified organism: J18.9

## 2017-04-06 HISTORY — DX: Type 2 diabetes mellitus without complications: E11.9

## 2017-04-06 HISTORY — DX: Personal history of other diseases of the musculoskeletal system and connective tissue: Z87.39

## 2017-04-06 HISTORY — DX: Other complications of anesthesia, initial encounter: T88.59XA

## 2017-04-06 LAB — GLUCOSE, CAPILLARY
GLUCOSE-CAPILLARY: 240 mg/dL — AB (ref 65–99)
GLUCOSE-CAPILLARY: 205 mg/dL — AB (ref 65–99)
GLUCOSE-CAPILLARY: 256 mg/dL — AB (ref 65–99)
Glucose-Capillary: 337 mg/dL — ABNORMAL HIGH (ref 65–99)
Glucose-Capillary: 393 mg/dL — ABNORMAL HIGH (ref 65–99)

## 2017-04-06 LAB — PREGNANCY, URINE: Preg Test, Ur: NEGATIVE

## 2017-04-06 LAB — POCT ACTIVATED CLOTTING TIME
ACTIVATED CLOTTING TIME: 180 s
Activated Clotting Time: 202 seconds
Activated Clotting Time: 191 seconds

## 2017-04-06 SURGERY — PVC ABLATION
Anesthesia: General

## 2017-04-06 MED ORDER — METFORMIN HCL 500 MG PO TABS
500.0000 mg | ORAL_TABLET | Freq: Two times a day (BID) | ORAL | Status: DC
  Administered 2017-04-06 – 2017-04-07 (×2): 500 mg via ORAL
  Filled 2017-04-06 (×4): qty 1

## 2017-04-06 MED ORDER — SODIUM CHLORIDE 0.9 % IV SOLN
INTRAVENOUS | Status: DC
  Administered 2017-04-06: 06:00:00 via INTRAVENOUS

## 2017-04-06 MED ORDER — ONDANSETRON HCL 4 MG/2ML IJ SOLN
INTRAMUSCULAR | Status: DC | PRN
  Administered 2017-04-06: 4 mg via INTRAVENOUS

## 2017-04-06 MED ORDER — FENTANYL CITRATE (PF) 100 MCG/2ML IJ SOLN
INTRAMUSCULAR | Status: DC | PRN
  Administered 2017-04-06: 50 ug via INTRAVENOUS
  Administered 2017-04-06 (×2): 25 ug via INTRAVENOUS
  Administered 2017-04-06 (×2): 50 ug via INTRAVENOUS
  Administered 2017-04-06 (×2): 25 ug via INTRAVENOUS
  Administered 2017-04-06: 50 ug via INTRAVENOUS

## 2017-04-06 MED ORDER — LIDOCAINE HCL (PF) 1 % IJ SOLN
INTRAMUSCULAR | Status: DC | PRN
  Administered 2017-04-06: 10 mL

## 2017-04-06 MED ORDER — POTASSIUM CHLORIDE CRYS ER 20 MEQ PO TBCR
20.0000 meq | EXTENDED_RELEASE_TABLET | Freq: Every day | ORAL | Status: DC
  Administered 2017-04-07: 20 meq via ORAL
  Filled 2017-04-06: qty 1

## 2017-04-06 MED ORDER — ONDANSETRON HCL 4 MG/2ML IJ SOLN
INTRAMUSCULAR | Status: AC
  Filled 2017-04-06: qty 2

## 2017-04-06 MED ORDER — ACETAMINOPHEN 325 MG PO TABS
650.0000 mg | ORAL_TABLET | ORAL | Status: DC | PRN
  Administered 2017-04-06 – 2017-04-07 (×4): 650 mg via ORAL
  Filled 2017-04-06 (×4): qty 2

## 2017-04-06 MED ORDER — PROPOFOL 10 MG/ML IV BOLUS
INTRAVENOUS | Status: DC | PRN
  Administered 2017-04-06 (×2): 10 mg via INTRAVENOUS
  Administered 2017-04-06: 20 mg via INTRAVENOUS
  Administered 2017-04-06 (×2): 10 mg via INTRAVENOUS
  Administered 2017-04-06: 20 mg via INTRAVENOUS

## 2017-04-06 MED ORDER — SODIUM CHLORIDE 0.9% FLUSH
3.0000 mL | Freq: Two times a day (BID) | INTRAVENOUS | Status: DC
  Administered 2017-04-06: 3 mL via INTRAVENOUS

## 2017-04-06 MED ORDER — CARVEDILOL 12.5 MG PO TABS
12.5000 mg | ORAL_TABLET | Freq: Two times a day (BID) | ORAL | Status: DC
  Administered 2017-04-07: 12.5 mg via ORAL
  Filled 2017-04-06: qty 1

## 2017-04-06 MED ORDER — SODIUM CHLORIDE 0.9% FLUSH: 3.0000 mL | INTRAVENOUS | Status: DC | PRN

## 2017-04-06 MED ORDER — ONDANSETRON HCL 4 MG/2ML IJ SOLN
4.0000 mg | Freq: Four times a day (QID) | INTRAMUSCULAR | Status: DC | PRN
  Administered 2017-04-06: 4 mg via INTRAVENOUS

## 2017-04-06 MED ORDER — HEPARIN SODIUM (PORCINE) 1000 UNIT/ML IJ SOLN
INTRAMUSCULAR | Status: DC | PRN
  Administered 2017-04-06: 5000 [IU] via INTRAVENOUS
  Administered 2017-04-06: 3000 [IU] via INTRAVENOUS

## 2017-04-06 MED ORDER — SPIRONOLACTONE 25 MG PO TABS: 25.0000 mg | ORAL_TABLET | Freq: Every day | ORAL | Status: DC

## 2017-04-06 MED ORDER — HEPARIN (PORCINE) IN NACL 2-0.9 UNIT/ML-% IJ SOLN
INTRAMUSCULAR | Status: AC | PRN
  Administered 2017-04-06: 500 mL

## 2017-04-06 MED ORDER — SODIUM CHLORIDE 0.9 % IV SOLN: 250.0000 mL | INTRAVENOUS | Status: DC | PRN

## 2017-04-06 MED ORDER — HYDROCODONE-ACETAMINOPHEN 5-325 MG PO TABS
1.0000 | ORAL_TABLET | ORAL | Status: DC | PRN
  Administered 2017-04-06: 2 via ORAL
  Filled 2017-04-06: qty 2

## 2017-04-06 MED ORDER — MIDAZOLAM HCL 5 MG/5ML IJ SOLN
INTRAMUSCULAR | Status: DC | PRN
  Administered 2017-04-06: 2 mg via INTRAVENOUS

## 2017-04-06 MED ORDER — POTASSIUM CHLORIDE CRYS ER 20 MEQ PO TBCR
40.0000 meq | EXTENDED_RELEASE_TABLET | Freq: Every day | ORAL | Status: DC
  Administered 2017-04-06: 40 meq via ORAL
  Filled 2017-04-06: qty 2

## 2017-04-06 MED ORDER — LIDOCAINE HCL (PF) 1 % IJ SOLN
INTRAMUSCULAR | Status: AC
  Filled 2017-04-06: qty 30

## 2017-04-06 MED ORDER — ACETAMINOPHEN 325 MG PO TABS
ORAL_TABLET | ORAL | Status: AC
  Filled 2017-04-06: qty 2

## 2017-04-06 MED ORDER — INSULIN ASPART 100 UNIT/ML ~~LOC~~ SOLN
0.0000 [IU] | Freq: Three times a day (TID) | SUBCUTANEOUS | Status: DC
  Administered 2017-04-06: 9 [IU] via SUBCUTANEOUS
  Administered 2017-04-07: 5 [IU] via SUBCUTANEOUS

## 2017-04-06 MED ORDER — HEPARIN SODIUM (PORCINE) 1000 UNIT/ML IJ SOLN
INTRAMUSCULAR | Status: DC | PRN
  Administered 2017-04-06: 1000 [IU] via INTRAVENOUS

## 2017-04-06 MED ORDER — HEPARIN SODIUM (PORCINE) 1000 UNIT/ML IJ SOLN
INTRAMUSCULAR | Status: AC
  Filled 2017-04-06: qty 1

## 2017-04-06 MED ORDER — SODIUM CHLORIDE 0.9 % IV SOLN
INTRAVENOUS | Status: DC | PRN
  Administered 2017-04-06: 06:00:00 via INTRAVENOUS

## 2017-04-06 SURGICAL SUPPLY — 15 items
BAG SNAP BAND KOVER 36X36 (MISCELLANEOUS) ×2 IMPLANT
BLANKET WARM UNDERBOD FULL ACC (MISCELLANEOUS) ×2 IMPLANT
CATH DECANAV D CURVE (CATHETERS) ×1 IMPLANT
CATH NAVISTAR SMARTTOUCH DF (ABLATOR) ×1 IMPLANT
CATH SOUNDSTAR ECO 8FR (CATHETERS) ×1 IMPLANT
COVER SWIFTLINK CONNECTOR (BAG) ×2 IMPLANT
PACK EP LATEX FREE (CUSTOM PROCEDURE TRAY) ×2
PACK EP LF (CUSTOM PROCEDURE TRAY) ×1 IMPLANT
PAD DEFIB LIFELINK (PAD) ×2 IMPLANT
PATCH CARTO3 (PAD) ×1 IMPLANT
SHEATH PINNACLE 6F 10CM (SHEATH) ×1 IMPLANT
SHEATH PINNACLE 7F 10CM (SHEATH) ×1 IMPLANT
SHEATH PINNACLE 8F 10CM (SHEATH) ×2 IMPLANT
SHIELD RADPAD SCOOP 12X17 (MISCELLANEOUS) ×2 IMPLANT
TUBING SMART ABLATE COOLFLOW (TUBING) ×2 IMPLANT

## 2017-04-06 NOTE — Transfer of Care (Signed)
Immediate Anesthesia Transfer of Care Note  Patient: Kelly [name]  Procedure(s) Performed: Procedure(s): PVC Ablation (N/A)  Patient Location: Cath Lab  Anesthesia Type:MAC  Level of Consciousness: awake, alert  and oriented  Airway & Oxygen Therapy: Patient Spontanous Breathing  Post-op Assessment: Report given to RN, Post -op Vital signs reviewed and stable and Patient moving all extremities X 4  Post vital signs: Reviewed and stable  Last Vitals:  Vitals:   04/06/17 0543 04/06/17 0611  BP: (!) 170/105 135/82  Pulse: (!) 43   Temp: 37.1 C   SpO2: 95%     Last Pain:  Vitals:   04/06/17 0547  PainSc: 3       Patients Stated Pain Goal: 3 (23/95/32 0233)  Complications: No apparent anesthesia complications

## 2017-04-06 NOTE — Discharge Instructions (Signed)
No driving for 4 days. No lifting over 5 lbs for 1 week. No sexual activity for 1 week. You may return to work in 1 week. Keep procedure site clean & dry. If you notice increased pain, swelling, bleeding or pus, call/return!  You may shower, but no soaking baths/hot tubs/pools for 1 week.  ° ° °

## 2017-04-06 NOTE — Progress Notes (Signed)
Site area: RFV x 1---RFV x 2 Site Prior to Removal:  Level 0 Pressure Applied For: arterial 35 min plus----venous 15 min Manual:   yes Patient Status During Pull:  stable Post Pull Site:  Level 0 Post Pull Instructions Given:  yes Post Pull Pulses Present: palpable Dressing Applied:  tegaderm Bedrest begins @ 1310 till 1910 Comments: possible beginning of arterial hematoma at 20 min

## 2017-04-06 NOTE — Discharge Summary (Signed)
ELECTROPHYSIOLOGY PROCEDURE DISCHARGE SUMMARY    Patient ID: Kelly [name],  MRN: [id number], DOB/AGE: [birth year] 31 y.o.  Admit date: 04/06/2017 Discharge date: 04/07/2017  Primary Care Physician: Bensimhon Primary Cardiologist: Ily Denno  Primary Discharge Diagnosis:  Active Problems:   PVC's (premature ventricular contractions)  Secondary Discharge Diagnosis: 1.  NICM 2.  Diabetes 3.  HTN  Allergies  Allergen Reactions  . Aldactone [Spironolactone] Shortness Of Breath  . Latex Rash    Procedures This Admission:  1.  Electrophysiology study and radiofrequency catheter ablation on 04/06/17 by [name]. This study demonstrated sinus rhythm with bigeminal LBB inferior axis PVCs upon presentation; successful mapping and ablation of the PVC focus within the right coronary cusp of the aorta.  RF was limited by proximity to the right coronary artery ostium; no inducible arrhythmias post ablation; no early apparent complications.  Brief HPI:  JANIT CUTTER is a 31 y.o. female with the above past medical history and bigeminal PVC's.  She was seen in the outpatient setting and treatment options were reviewed.  Risks, benefits to EPS/ablation were discussed with the patient who wished to proceed.   Hospital Course:  The patient was admitted and underwent EPS/ablation with details as outlined above. She was monitored on telemetry overnight which demonstrated sinus rhythm, rare PVC's (different morphology than the PVC she presented with in bigeminy).  Groin was without complication. She was seen by [name] and considered stable for discharge to home. Follow up has been made for 4 weeks post ablation.   Physical Exam: Vitals:   04/06/17 1521 04/06/17 1822 04/06/17 2019 04/07/17 0500  BP: 123/80 111/66 112/74 113/70  Pulse: 76 78 73 64  Resp: Temp: 98.3 F (36.8 C)  98.2 F (36.8 C) 98 F (36.7 C)  TempSrc: Oral  Oral Oral  SpO2: 100% 100% 99% 99%    Weight: 250 lb 8 oz (113.6 kg)   249 lb 3.2 oz (113 kg)  Height:  (1.676 m)       GEN- The patient is well appearing, alert and oriented x 3 today.   HEENT: normocephalic, atraumatic; sclera clear, conjunctiva pink; hearing intact; oropharynx clear; neck supple  Lungs- Clear to ausculation bilaterally, normal work of breathing.  No wheezes, rales, rhonchi Heart- Regular rate and rhythm, no murmurs, rubs or gallops  GI- soft, non-tender, non-distended, bowel sounds present  Extremities- no clubbing, cyanosis, or edema; DP/PT/radial pulses 2+ bilaterally MS- no significant deformity or atrophy Skin- warm and dry, no rash or lesion Psych- euthymic mood, full affect Neuro- strength and sensation are intact    Labs:   Lab Results  Component Value Date   WBC 8.8 04/01/2017   HGB 13.6 04/01/2017   HCT 40.9 04/01/2017   MCV 88 04/01/2017   PLT 261 04/01/2017     Recent Labs Lab 04/01/17 0854  NA 139  K 4.3  CL 100  CO2 24  BUN 8  CREATININE 0.69  CALCIUM 9.3  GLUCOSE 263*     Discharge Medications:  Current Discharge Medication List    CONTINUE these medications which have NOT CHANGED   Details  acetaminophen (TYLENOL) 500 MG tablet Take 1,000 mg by mouth every 6 (six) hours as needed for moderate pain or headache.    carvedilol (COREG) 12.5 MG tablet Take 1 tablet (12.5 mg total) by mouth 2 (two) times daily with a meal. Qty: 30 tablet, Refills: 1    eplerenone (INSPRA) 25  MG tablet Take 1 tablet (25 mg total) by mouth daily. Qty: 30 tablet, Refills: 6    Magnesium Oxide 200 MG TABS Take 1 tablet (200 mg total) by mouth 2 (two) times daily. Qty: 60 tablet, Refills: 6    metFORMIN (GLUCOPHAGE) 500 MG tablet Take 1 tablet (500 mg total) by mouth 2 (two) times daily with a meal. Qty: 60 tablet, Refills: 1   Associated Diagnoses: Type 2 diabetes mellitus without complication, without long-term current use of insulin (HCC)    ondansetron (ZOFRAN ODT) 4 MG  disintegrating tablet Take 1 tablet (4 mg total) by mouth every 8 (eight) hours as needed for nausea or vomiting. Qty: 10 tablet, Refills: 0    potassium chloride SA (K-DUR,KLOR-CON) 20 MEQ tablet Take 20-40 mEq by mouth See admin instructions. Take 20 MEQ by mouth in the morning and 40 MEQ in the evening        Disposition: Pt is being discharged home today in good condition. Discharge Instructions    Diet - low sodium heart healthy    Complete by:  As directed    Increase activity slowly    Complete by:  As directed      Follow-up Information    Hillis Range, MD Follow up on 05/10/2017.   Specialty:  Cardiology Why:  at 9:45AM Contact information: [address] [place] [address] [phone]           Duration of Discharge Encounter: Greater than 30 minutes including physician time.  Signed, Gypsy Balsam, NP 04/07/2017 8:25 AM  I have seen, examined the patient, and reviewed the above assessment and plan. On exam, RRR.  PVCs well controlled post ablation.  Changes to above are made where necessary.  DC to home.  Follow-up with me in 4 weeks.  Co Sign: Hillis Range, MD 04/07/2017 10:11 AM

## 2017-04-06 NOTE — Progress Notes (Addendum)
Rt fem art and venous sheath site showed sign of hematoma when leaving cath lab holding area. Pt stated rt leg hurt and felt hard. Pt reconnected to ekg, Bp and o2 sat. Additional pressure was held to rt groin for another . Pt felt nauseated and Zofran was given. Rt groin site is now soft to the touch. No ridge, or hematoma. Pt is asleep, but is rousable. Bedrest begins at 1435.    Dr. Johney Frame updated.  [name] wants to evaluate  pts groin before pt goes to her room. Pt to stay in [facility] until then.

## 2017-04-06 NOTE — Anesthesia Procedure Notes (Signed)
Procedure Name: MAC Date/Time: 04/06/2017 7:40 AM Performed by: Garrison Columbus T Pre-anesthesia Checklist: Patient identified, Emergency Drugs available, Suction available and Patient being monitored Patient Re-evaluated:Patient Re-evaluated prior to induction Oxygen Delivery Method: Simple face mask Preoxygenation: Pre-oxygenation with 100% oxygen Induction Type: IV induction Placement Confirmation: positive ETCO2 and breath sounds checked- equal and bilateral Dental Injury: Teeth and Oropharynx as per pre-operative assessment

## 2017-04-06 NOTE — Interval H&P Note (Signed)
History and Physical Interval Note:  04/06/2017 7:28 AM  Kelly [name]  has presented today for surgery, with the diagnosis of pvc's  The various methods of treatment have been discussed with the patient and family. After consideration of risks, benefits and other options for treatment, the patient has consented to  Procedure(s): PVC Ablation (N/A) as a surgical intervention .  The patient's history has been reviewed, patient examined, no change in status, stable for surgery.  I have reviewed the patient's chart and labs.  Questions were answered to the patient's satisfaction.     Hillis Range

## 2017-04-06 NOTE — Anesthesia Postprocedure Evaluation (Signed)
Anesthesia Post Note  Patient: Kelly [name]  Procedure(s) Performed: Procedure(s) (LRB): PVC Ablation (N/A)     Patient location during evaluation: PACU Anesthesia Type: MAC Level of consciousness: awake and alert Pain management: pain level controlled Vital Signs Assessment: post-procedure vital signs reviewed and stable Respiratory status: spontaneous breathing and respiratory function stable Cardiovascular status: stable Postop Assessment: no apparent nausea or vomiting Anesthetic complications: no    Last Vitals:  Vitals:   04/06/17 1500 04/06/17 1521  BP: 116/68 123/80  Pulse: 78 76  Resp:  17  Temp:  36.8 C  SpO2: 100% 100%    Last Pain:  Vitals:   04/06/17 1530  TempSrc:   PainSc: 0-No pain                 Nour Rodrigues DANIEL

## 2017-04-06 NOTE — Progress Notes (Signed)
While assisting pt. to bed pan, pt. experienced continuous pain. Site assessed; additional swelling and firmness noted. Meri RN on 6C came to assist with further assessment. Manual pressure applied for 10-15 minutes until hematoma dispersed. Site felt soft after. Site rated level 1.

## 2017-04-06 NOTE — H&P (View-Only) (Signed)
Electrophysiology Office Note   Date:  03/15/2017   ID:  Kelly [name], DOB [birth year], MRN [id number]  PCP:  Helane Rima, DO  Cardiologist:  Bensimhon Primary Electrophysiologist: Hillis Range, MD    Chief Complaint  Patient presents with  . Follow-up    Chronic systolic CHF     History of Present Illness: Kelly [name] is a 31 y.o. female who presents today for electrophysiology evaluation.   The patient has nonischemic CM.  She has very frequent PVCs (bigeminal) which may be contributing.  She has occasional fatigue.  Mostly feels well currently.  Today, she denies symptoms of palpitations, chest pain, shortness of breath, orthopnea, PND, lower extremity edema, claudication, dizziness, presyncope, syncope, bleeding, or neurologic sequela. The patient is tolerating medications without difficulties and is otherwise without complaint today.    Past Medical History:  Diagnosis Date  . CHF (congestive heart failure) (HCC)   . Diabetes mellitus without complication (HCC)   . Hypertension   . ICD (implantable cardioverter-defibrillator) in place    Past Surgical History:  Procedure Laterality Date  . CARDIAC DEFIBRILLATOR PLACEMENT    . ICD GENERATOR CHANGEOUT N/A 09/03/2016   Procedure: ICD Generator Changeout;  Surgeon: Hillis Range, MD;  Location: [facility] INVASIVE CV LAB;  Service: Cardiovascular;  Laterality: N/A;     Current Outpatient Prescriptions  Medication Sig Dispense Refill  . carvedilol (COREG) 12.5 MG tablet Take 1 tablet (12.5 mg total) by mouth 2 (two) times daily with a meal. 30 tablet 1  . eplerenone (INSPRA) 25 MG tablet Take 1 tablet (25 mg total) by mouth daily. 30 tablet 6  . Magnesium Oxide 200 MG TABS Take 1 tablet (200 mg total) by mouth 2 (two) times daily. 60 tablet 6  . metFORMIN (GLUCOPHAGE) 500 MG tablet Take 1 tablet (500 mg total) by mouth 2 (two) times daily with a meal. 60 tablet 1  . ondansetron (ZOFRAN ODT) 4 MG disintegrating  tablet Take 1 tablet (4 mg total) by mouth every 8 (eight) hours as needed for nausea or vomiting. 10 tablet 0  . potassium chloride SA (K-DUR,KLOR-CON) 20 MEQ tablet Take 1 tablet by mouth in the morning and 2 tablets in the evening     No current facility-administered medications for this visit.     Allergies:   Aldactone [spironolactone] and Latex   Social History:  The patient  reports that she has been smoking Cigarettes.  She has been smoking about 0.25 packs per day. She has never used smokeless tobacco. She reports that she does not drink alcohol or use drugs.   Family History:  The patient's  family history includes Diabetes in her maternal grandmother; Heart disease in her maternal grandmother; Hyperlipidemia in her maternal grandmother; Hypertension in her maternal grandmother; Mental illness in her brother.    ROS:  Please see the history of present illness.   All other systems are personally reviewed and negative.    PHYSICAL EXAM: VS:  BP 128/84   Pulse 62   Ht  (1.676 m)   Wt 251 lb (113.9 kg)   SpO2 98%   BMI 40.51 kg/m  , BMI Body mass index is 40.51 kg/m. GEN: overweight, in no acute distress  HEENT: normal  Neck: no JVD, carotid bruits, or masses Cardiac: RRR with very frequent ectopy in bigeminy; no murmurs, rubs, or gallops,no edema  Respiratory:  clear to auscultation bilaterally, normal work of breathing GI: soft, nontender, nondistended, + BS MS: no deformity  or atrophy  Skin: warm and dry  Neuro:  Strength and sensation are intact Psych: euthymic mood, full affect    Recent Labs: 12/12/2016: B Natriuretic Peptide 70.1 12/21/2016: ALT 22; BUN 9; Creatinine, Ser 0.68; Hemoglobin 13.6; Magnesium 1.6; Platelets 266.0; Potassium 4.1; Sodium 135; TSH 1.66  personally reviewed   Lipid Panel  No results found for: CHOL, TRIG, HDL, CHOLHDL, VLDL, LDLCALC, LDLDIRECT personally reviewed   Wt Readings from Last 3 Encounters:  03/15/17 251 lb (113.9 kg)    02/21/17 240 lb (108.9 kg)  01/19/17 249 lb 9.6 oz (113.2 kg)      Other studies personally reviewed: Additional studies/ records that were reviewed today include: CHF clinic notes, EP notes, prior ekgs  Review of the above records today demonstrates: as above   ASSESSMENT AND PLAN:  1.  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  Given compliance with EP follow-up, I would not advise AAD therapy. Therapeutic strategies for PVCs including medicine and ablation were discussed in detail with the patient today. Risk, benefits, and alternatives to EP study and radiofrequency ablation were also discussed in detail today. These risks include but are not limited to stroke, bleeding, vascular damage, tamponade, perforation, damage to the heart and other structures, AV block requiring pacemaker, worsening renal function, ICd lead dislodgement, and death. The patient understands these risk and wishes to proceed.  We will therefore proceed with catheter ablation at the next available time.  Carto, ICE, and anesthesia are requested for the procedure  2. Nonischemic CM/ chronic systolic dysfunction Stable Hopefully will improve with PVC supression ICD interrogation today is reviewed and normal Normal ICD function See Pace Art report No changes today ICD pocket has healed   Current medicines are reviewed at length with the patient today.   The patient does not have concerns regarding her medicines.  The following changes were made today:  none    Signed, Hillis Range, MD  03/15/2017 9:49 AM     [facility] [address] [place] [address] [phone] (office) [phone] (fax)

## 2017-04-07 ENCOUNTER — Encounter: Payer: Self-pay | Admitting: Nurse Practitioner

## 2017-04-07 DIAGNOSIS — I493 Ventricular premature depolarization: Secondary | ICD-10-CM

## 2017-04-07 LAB — POCT ACTIVATED CLOTTING TIME: ACTIVATED CLOTTING TIME: 224 s

## 2017-04-07 LAB — GLUCOSE, CAPILLARY: GLUCOSE-CAPILLARY: 260 mg/dL — AB (ref 65–99)

## 2017-04-07 NOTE — Plan of Care (Signed)
Problem: Activity: Goal: Ability to return to baseline activity level will improve Outcome: Progressing Patient ambulated distance of entire hallway independently and with ease

## 2017-04-14 ENCOUNTER — Telehealth: Payer: Self-pay | Admitting: Internal Medicine

## 2017-04-14 ENCOUNTER — Encounter: Payer: Self-pay | Admitting: *Deleted

## 2017-04-14 ENCOUNTER — Ambulatory Visit (INDEPENDENT_AMBULATORY_CARE_PROVIDER_SITE_OTHER): Payer: PRIVATE HEALTH INSURANCE | Admitting: Internal Medicine

## 2017-04-14 ENCOUNTER — Encounter: Payer: Self-pay | Admitting: Internal Medicine

## 2017-04-14 VITALS — BP 122/80 | HR 93 | Ht 66.0 in | Wt 252.4 lb

## 2017-04-14 DIAGNOSIS — I493 Ventricular premature depolarization: Secondary | ICD-10-CM

## 2017-04-14 NOTE — Progress Notes (Signed)
S: Pt presents today for groin check Doing very well post ablation, without further PVCs Energy is much improved and she is thankful.  Denies CP, SOB, or procedure related complications.  She has moderate ecchymosis of her R groin.     O:  Vitals:   04/14/17 1555  BP: 122/80  Pulse: 93  SpO2: 99%   On exam, well appearing, RRR.  moderate ecchymosis is noted in R groin,  Tenderness with small hematoma.  No bruit.  ekg today reveals sinus rhythm  Assessment/ Plan:  Small post procedure hematoma No further workup required currently Tylenol prn for pain Would wait an additional week before returning to work  She should contact my office should she have additional concerns  Routine follow-up in October as scheduled  Hillis Range MD, [facility] 04/14/2017 4:11 PM

## 2017-04-14 NOTE — Telephone Encounter (Signed)
Returned call to patient.  Let her know we discussed leg problem with Dr. Johney Frame and advised her to come in to see him at 3:30..  She was willing to do that.

## 2017-04-14 NOTE — Patient Instructions (Signed)
Medication Instructions:  Your physician recommends that you continue on your current medications as directed. Please refer to the Current Medication list given to you today.   Labwork: None ordered   Testing/Procedures: None ordered   Follow-Up: Your physician recommends that you schedule a follow-up appointment as scheduled.  May return to work 04/22/17   Any Other Special Instructions Will Be Listed Below (If Applicable).     If you need a refill on your cardiac medications before your next appointment, please call your pharmacy.

## 2017-04-14 NOTE — Telephone Encounter (Signed)
New message    Pt is calling stating she had a cath last week. She said she was told she could go back to work today but she is still having a lot of pain in her leg and there is a large bruise. Please call.

## 2017-04-21 ENCOUNTER — Encounter (INDEPENDENT_AMBULATORY_CARE_PROVIDER_SITE_OTHER): Payer: PRIVATE HEALTH INSURANCE | Admitting: Family Medicine

## 2017-04-21 DIAGNOSIS — Z23 Encounter for immunization: Secondary | ICD-10-CM | POA: Diagnosis not present

## 2017-04-21 DIAGNOSIS — Z0289 Encounter for other administrative examinations: Secondary | ICD-10-CM

## 2017-04-22 ENCOUNTER — Ambulatory Visit (INDEPENDENT_AMBULATORY_CARE_PROVIDER_SITE_OTHER): Payer: PRIVATE HEALTH INSURANCE | Admitting: Family Medicine

## 2017-04-22 ENCOUNTER — Encounter: Payer: Self-pay | Admitting: Family Medicine

## 2017-04-22 VITALS — BP 126/78 | HR 90 | Temp 98.6°F | Ht 66.0 in | Wt 252.6 lb

## 2017-04-22 DIAGNOSIS — Z6841 Body Mass Index (BMI) 40.0 and over, adult: Secondary | ICD-10-CM

## 2017-04-22 DIAGNOSIS — Z23 Encounter for immunization: Secondary | ICD-10-CM

## 2017-04-22 DIAGNOSIS — E282 Polycystic ovarian syndrome: Secondary | ICD-10-CM | POA: Diagnosis not present

## 2017-04-22 DIAGNOSIS — E119 Type 2 diabetes mellitus without complications: Secondary | ICD-10-CM

## 2017-04-22 DIAGNOSIS — F17211 Nicotine dependence, cigarettes, in remission: Secondary | ICD-10-CM | POA: Diagnosis not present

## 2017-04-22 LAB — POCT GLYCOSYLATED HEMOGLOBIN (HGB A1C): Hemoglobin A1C: 10.3

## 2017-04-22 MED ORDER — DULAGLUTIDE 1.5 MG/0.5ML ~~LOC~~ SOAJ: 1.0000 "pen " | SUBCUTANEOUS | 0 refills | Status: DC

## 2017-04-22 MED ORDER — DULAGLUTIDE 0.75 MG/0.5ML ~~LOC~~ SOAJ: 1.0000 "pen " | SUBCUTANEOUS | 0 refills | Status: DC

## 2017-04-22 NOTE — Progress Notes (Signed)
Kelly [name] is a 31 y.o. female is here for follow up.  History of Present Illness:   Insurance claims handler, CMA, acting as scribe for Dr. Earlene [name].  HPI:  Current symptoms: no polyuria or polydipsia, no chest pain, dyspnea or TIA's, no numbness, tingling or pain in extremities.  Taking medication compliantly without noted sided effects   YES    NO   Episodes of hypoglycemia?   YES    NO Maintaining a diabetic diet?   YES    NO Trying to exercise on a regular basis?   YES    NO  On ACE inhibitor or angiotensin II receptor blocker?   YES    NO On Aspirin?   YES    NO  Lab Results  Component Value Date   HGBA1C 10.3 04/22/2017    No results found for: MICROALBUR, MALB24HUR  No results found for: CHOL, HDL, LDLCALC, LDLDIRECT, TRIG, CHOLHDL    Wt Readings from Last 3 Encounters:  04/22/17 252 lb 9.6 oz (114.6 kg)  04/14/17 252 lb 6.4 oz (114.5 kg)  04/07/17 249 lb 3.2 oz (113 kg)   BP Readings from Last 3 Encounters:  04/22/17 126/78  04/14/17 122/80  04/07/17 113/70   Lab Results  Component Value Date   CREATININE 0.69 04/01/2017   Health Maintenance Due  Topic Date Due  . OPHTHALMOLOGY EXAM  09/20/1993  . URINE MICROALBUMIN  09/20/1993   Depression screen PHQ 2/9 04/22/2017  Decreased Interest 0  Down, Depressed, Hopeless 0  PHQ - 2 Score 0   PMHx, SurgHx, SocialHx, FamHx, Medications, and Allergies were reviewed in the Visit Navigator and updated as appropriate.   Patient Active Problem List   Diagnosis Date Noted  . PVC's (premature ventricular contractions) 04/06/2017  . Excessive daytime sleepiness 03/21/2017  . Congestive heart failure (HCC) 12/21/2016  . Memory difficulty 12/21/2016  . BMI 40.0-44.9, adult (HCC) 03/29/2015  . PCOS (polycystic ovarian syndrome) 03/29/2015  . Nicotine dependence in remission 03/29/2015  . Dry eyes 02/15/2014  . AICD (automatic cardioverter/defibrillator) present 06/17/2013  .  Cardiomyopathy, dilated, nonischemic (HCC) 06/17/2013  . Hypertension 12/21/2011  . Diabetes mellitus (HCC) 05/13/2010   Social History  Substance Use Topics  . Smoking status: Former Smoker    Packs/day: 0.50    Years: 10.00    Types: Cigarettes    Quit date: 04/06/2017  . Smokeless tobacco: Never Used  . Alcohol use Yes     Comment: 04/06/2017 "might have a couple drinks q couple weeks"   Current Medications and Allergies:   .  carvedilol (COREG) 12.5 MG tablet, Take 1 tablet (12.5 mg total) by mouth 2 (two) times daily with a meal., Disp: 30 tablet .  eplerenone (INSPRA) 25 MG tablet, Take 1 tablet (25 mg total) by mouth daily., Disp: 30 tablet, Rfl: 6 .  Magnesium Oxide 200 MG TABS, Take 1 tablet (200 mg total) by mouth 2 (two) times daily., Disp: 60 tablet, Rfl: 6 .  metFORMIN (GLUCOPHAGE) 500 MG tablet, Take 1 tablet (500 mg total) by mouth 2 (two) times daily with a meal., Disp: 60 tablet, Rfl: 1 - taking 2 in am and 1 in pm .  potassium chloride SA (K-DUR,KLOR-CON) 20 MEQ tablet, Take 20-40 mEq by mouth See admin instructions. Take 20 MEQ by mouth in the morning and 40 MEQ in the evening, Disp: , Rfl:  .  Dulaglutide (TRULICITY) 0.75 MG/0.5ML SOPN, Inject 1 pen into the skin once a week., Disp: 2  pen, Rfl: 0 .  Dulaglutide (TRULICITY) 1.5 MG/0.5ML SOPN, Inject 1 pen into the skin once a week., Disp: 2 pen, Rfl: 0  Allergies  Allergen Reactions  . Aldactone [Spironolactone] Shortness Of Breath  . Latex Rash   Review of Systems   Pertinent items are noted in the HPI. Otherwise, ROS is negative.  Vitals:   Vitals:   04/22/17 1058  BP: 126/78  Pulse: 90  Temp: 98.6 F (37 C)  TempSrc: Oral  SpO2: 95%  Weight: 252 lb 9.6 oz (114.6 kg)  Height: 5\' 6"  (1.676 m)     Body mass index is 40.77 kg/m.   Physical Exam:   Physical Exam  Constitutional: She appears well-nourished.  HENT:  Head: Normocephalic and atraumatic.  Eyes: Pupils are equal, round, and reactive to  light. EOM are normal.  Neck: Normal range of motion. Neck supple.  Cardiovascular: Normal rate, regular rhythm, normal heart sounds and intact distal pulses.   Pulmonary/Chest: Effort normal.  Abdominal: Soft.  Skin: Skin is warm.  Psychiatric: She has a normal mood and affect. Her behavior is normal.  Nursing note and vitals reviewed.  Diabetic Foot Exam - Simple   Simple Foot Form Diabetic Foot exam was performed with the following findings:  Yes 04/22/2017 12:59 PM  Visual Inspection No deformities, no ulcerations, no other skin breakdown bilaterally:  Yes Sensation Testing Intact to touch and monofilament testing bilaterally:  Yes Pulse Check Posterior Tibialis and Dorsalis pulse intact bilaterally:  Yes Comments     Results for orders placed or performed in visit on 04/22/17  POCT glycosylated hemoglobin (Hb A1C)  Result Value Ref Range   Hemoglobin A1C 10.3    Assessment and Plan:   Kelly [name] was seen today for follow-up.  Diagnoses and all orders for this visit:  Type 2 diabetes mellitus without complication, without long-term current use of insulin (HCC) Comments: Worsened. Patient admits that she has not been focused on this but is ready now. Samples of Trulicity provided while we work on cost issues.  Orders: -     POCT glycosylated hemoglobin (Hb A1C) -     Dulaglutide (TRULICITY) 0.75 MG/0.5ML SOPN; Inject 1 pen into the skin once a week. -     Dulaglutide (TRULICITY) 1.5 MG/0.5ML SOPN; Inject 1 pen into the skin once a week. -     Ambulatory referral to Ophthalmology  Need for pneumococcal vaccination -     Pneumococcal polysaccharide vaccine 23-valent greater than or equal to 2yo subcutaneous/IM  PCOS (polycystic ovarian syndrome) Comments: See below.   BMI 40.0-44.9, adult [facility]) Comments: DM Education in place. PCOS podcast recommended. The patient is asked to make an attempt to improve diet and exercise patterns to aid in medical management of this  problem.   Cigarette nicotine dependence in remission Comments: No cigarettes since ablation. Doing very well.  . Reviewed expectations re: course of current medical issues. . Discussed self-management of symptoms. . Outlined signs and symptoms indicating need for more acute intervention. . Patient verbalized understanding and all questions were answered. Marland Kitchen Health Maintenance issues including appropriate healthy diet, exercise, and smoking avoidance were discussed with patient. . See orders for this visit as documented in the electronic medical record. . Patient received an After Visit Summary.  CMA served as Neurosurgeon during this visit. History, Physical, and Plan performed by medical provider. The above documentation has been reviewed and is accurate and complete. Helane Rima, D.O.  Helane Rima, DO [place] [place] 04/22/2017  Future Appointments Date Time Provider Department Center  05/10/2017 9:45 AM Hillis Range, MD [facility]  06/14/2017 8:40 AM [facility] DEVICE REMOTES [facility]

## 2017-04-25 ENCOUNTER — Emergency Department (HOSPITAL_COMMUNITY)
Admission: EM | Admit: 2017-04-25 | Discharge: 2017-04-26 | Disposition: A | Discharge status: Home / Self Care | Payer: PRIVATE HEALTH INSURANCE | Attending: Emergency Medicine | Admitting: Emergency Medicine

## 2017-04-25 ENCOUNTER — Encounter (HOSPITAL_COMMUNITY): Payer: Self-pay | Admitting: Emergency Medicine

## 2017-04-25 DIAGNOSIS — I509 Heart failure, unspecified: Secondary | ICD-10-CM | POA: Insufficient documentation

## 2017-04-25 DIAGNOSIS — M79604 Pain in right leg: Secondary | ICD-10-CM | POA: Insufficient documentation

## 2017-04-25 DIAGNOSIS — Z91040 Latex allergy status: Secondary | ICD-10-CM | POA: Insufficient documentation

## 2017-04-25 DIAGNOSIS — I110 Hypertensive heart disease with heart failure: Secondary | ICD-10-CM | POA: Insufficient documentation

## 2017-04-25 DIAGNOSIS — Z7984 Long term (current) use of oral hypoglycemic drugs: Secondary | ICD-10-CM | POA: Insufficient documentation

## 2017-04-25 DIAGNOSIS — Z87891 Personal history of nicotine dependence: Secondary | ICD-10-CM | POA: Insufficient documentation

## 2017-04-25 DIAGNOSIS — E119 Type 2 diabetes mellitus without complications: Secondary | ICD-10-CM | POA: Insufficient documentation

## 2017-04-25 DIAGNOSIS — Z79899 Other long term (current) drug therapy: Secondary | ICD-10-CM | POA: Insufficient documentation

## 2017-04-25 DIAGNOSIS — M7989 Other specified soft tissue disorders: Secondary | ICD-10-CM

## 2017-04-25 MED ORDER — RIVAROXABAN 15 MG PO TABS
15.0000 mg | ORAL_TABLET | Freq: Once | ORAL | Status: AC
  Administered 2017-04-25: 15 mg via ORAL
  Filled 2017-04-25: qty 1

## 2017-04-25 NOTE — ED Triage Notes (Signed)
Pt comes in with complaints of right leg pain and swelling.  Pt reports having a cardiac ablation due to PVC's on the 18th and the pain and swelling have worsened.  Endorses limited range of motion. Ambulatory.  A&O x4.  Denies SOB or chest pain.

## 2017-04-25 NOTE — Progress Notes (Signed)
Patient seen the following day.
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
## 2017-04-25 NOTE — Discharge Instructions (Signed)
Return to [facility] tomorrow to have a venous doppler test to see if there is a blood clot in your leg. If there is, you will need to be on blood thinners for 6 weeks. If there is no blood clot, youneed to continue your current treatment - ice, acetaminophen, ibuprofen.

## 2017-04-26 ENCOUNTER — Ambulatory Visit (HOSPITAL_COMMUNITY)
Admission: RE | Admit: 2017-04-26 | Discharge: 2017-04-26 | Disposition: A | Discharge status: Home / Self Care | Payer: PRIVATE HEALTH INSURANCE | Source: Ambulatory Visit | Attending: Emergency Medicine | Admitting: Emergency Medicine

## 2017-04-26 ENCOUNTER — Encounter: Payer: Self-pay | Admitting: Surgical

## 2017-04-26 ENCOUNTER — Ambulatory Visit (INDEPENDENT_AMBULATORY_CARE_PROVIDER_SITE_OTHER): Payer: PRIVATE HEALTH INSURANCE | Admitting: Family Medicine

## 2017-04-26 VITALS — BP 122/76 | HR 88 | Temp 98.5°F | Wt 252.0 lb

## 2017-04-26 DIAGNOSIS — M79609 Pain in unspecified limb: Secondary | ICD-10-CM | POA: Diagnosis not present

## 2017-04-26 DIAGNOSIS — T148XXA Other injury of unspecified body region, initial encounter: Secondary | ICD-10-CM

## 2017-04-26 DIAGNOSIS — M79604 Pain in right leg: Secondary | ICD-10-CM | POA: Diagnosis present

## 2017-04-26 DIAGNOSIS — M7989 Other specified soft tissue disorders: Secondary | ICD-10-CM | POA: Insufficient documentation

## 2017-04-26 DIAGNOSIS — N9489 Other specified conditions associated with female genital organs and menstrual cycle: Secondary | ICD-10-CM | POA: Insufficient documentation

## 2017-04-26 NOTE — Progress Notes (Signed)
VASCULAR LAB PRELIMINARY  PRELIMINARY  PRELIMINARY  PRELIMINARY  Right lower extremity venous duplex completed.    Preliminary report:  There is no DVT or SVT noted in the right lower extremity.  There is a hematoma measuring 2 cm X 2.7 cm noted in the groin.   Kassity Woodson, RVT 04/26/2017, 11:25 AM

## 2017-04-26 NOTE — Progress Notes (Signed)
Kelly [name] is a 31 y.o. female here for an acute visit.  History of Present Illness:   Britt Bottom CMA acting as scribe for Dr. Earlene Plater  HPI: Patient comes in today for follow up on her ER visit on 04/25/17. The ER did an [place] and patient would like to discuss the results.   PMHx, SurgHx, SocialHx, Medications, and Allergies were reviewed in the Visit Navigator and updated as appropriate.  Current Medications:   .  acetaminophen (TYLENOL) 500 MG tablet, Take 1,000 mg by mouth every 6 (six) hours as needed for moderate pain or headache., Disp: , Rfl:  .  carvedilol (COREG) 12.5 MG tablet, Take 1 tablet (12.5 mg total) by mouth 2 (two) times daily with a meal., Disp: 30 tablet, Rfl: 1 .  Dulaglutide (TRULICITY) 0.75 MG/0.5ML SOPN, Inject 1 pen into the skin once a week., Disp: 2 pen, Rfl: 0 .  Dulaglutide (TRULICITY) 1.5 MG/0.5ML SOPN, Inject 1 pen into the skin once a week., Disp: 2 pen, Rfl: 0 .  eplerenone (INSPRA) 25 MG tablet, Take 1 tablet (25 mg total) by mouth daily., Disp: 30 tablet, Rfl: 6 .  Magnesium Oxide 200 MG TABS, Take 1 tablet (200 mg total) by mouth 2 (two) times daily., Disp: 60 tablet, Rfl: 6 .  metFORMIN (GLUCOPHAGE) 500 MG tablet, Take 1 tablet (500 mg total) by mouth 2 (two) times daily with a meal., Disp: 60 tablet, Rfl: 1 .  potassium chloride SA (K-DUR,KLOR-CON) 20 MEQ tablet, Take 20-40 mEq by mouth See admin instructions. Take 20 MEQ by mouth in the morning and 40 MEQ in the evening, Disp: , Rfl:    Allergies  Allergen Reactions  . Aldactone [Spironolactone] Shortness Of Breath  . Latex Rash   Review of Systems:   Pertinent items are noted in the HPI. Otherwise, ROS is negative.  Vitals:   Vitals:   04/26/17 1436  BP: 122/76  Pulse: 88  Temp: 98.5 F (36.9 C)  TempSrc: Oral  SpO2: 97%  Weight: 252 lb (114.3 kg)     Body mass index is 40.67 kg/m.   Physical Exam:   Physical Exam  Constitutional: She appears well-nourished.  HENT:   Head: Normocephalic and atraumatic.  Eyes: Pupils are equal, round, and reactive to light. EOM are normal.  Neck: Normal range of motion. Neck supple.  Cardiovascular: Normal rate, regular rhythm, normal heart sounds and intact distal pulses.   Pulmonary/Chest: Effort normal.  Abdominal: Soft.  Skin: Skin is warm. Capillary refill takes less than 2 seconds.  Bruising to right upper thigh, mild and improving per patient. Improving hematoma at right groin.  Psychiatric: She has a normal mood and affect. Her behavior is normal.  Nursing note and vitals reviewed.  Doppler completed today: "the tech said that it was negative." No official read available.   Assessment and Plan:   Kelly [name] was seen today for follow-up.  Diagnoses and all orders for this visit:  Hematoma Comments: Improving. Negative [place]. Advised compression shorts and moist heat. Red flags reviewed.   . Reviewed expectations re: course of current medical issues. . Discussed self-management of symptoms. . Outlined signs and symptoms indicating need for more acute intervention. . Patient verbalized understanding and all questions were answered. Marland Kitchen Health Maintenance issues including appropriate healthy diet, exercise, and smoking avoidance were discussed with patient. . See orders for this visit as documented in the electronic medical record. . Patient received an After Visit Summary.  CMA served as Neurosurgeon  during this visit. History, Physical, and Plan performed by medical provider. The above documentation has been reviewed and is accurate and complete. Helane Rima, D.O.  Helane Rima, DO [place], [place] 04/27/2017  Future Appointments Date Time Provider Department Center  05/10/2017 9:45 AM Hillis Range, MD [facility]  06/14/2017 8:40 AM [facility] DEVICE REMOTES [facility]

## 2017-04-29 ENCOUNTER — Other Ambulatory Visit: Payer: Self-pay

## 2017-04-29 MED ORDER — DULAGLUTIDE 1.5 MG/0.5ML ~~LOC~~ SOAJ: 1.0000 "pen " | SUBCUTANEOUS | 11 refills | Status: DC

## 2017-05-10 ENCOUNTER — Encounter: Payer: Self-pay | Admitting: Internal Medicine

## 2017-05-10 ENCOUNTER — Ambulatory Visit (INDEPENDENT_AMBULATORY_CARE_PROVIDER_SITE_OTHER): Payer: PRIVATE HEALTH INSURANCE | Admitting: Internal Medicine

## 2017-05-10 VITALS — BP 126/90 | HR 85 | Ht 66.0 in | Wt 259.4 lb

## 2017-05-10 DIAGNOSIS — I420 Dilated cardiomyopathy: Secondary | ICD-10-CM | POA: Diagnosis not present

## 2017-05-10 DIAGNOSIS — I5022 Chronic systolic (congestive) heart failure: Secondary | ICD-10-CM | POA: Diagnosis not present

## 2017-05-10 DIAGNOSIS — I493 Ventricular premature depolarization: Secondary | ICD-10-CM | POA: Diagnosis not present

## 2017-05-10 LAB — CUP PACEART INCLINIC DEVICE CHECK
Battery Remaining Longevity: 91 mo
Date Time Interrogation Session: 20181022102522
HIGH POWER IMPEDANCE MEASURED VALUE: 72.4922
Implantable Lead Implant Date: 20090114
Implantable Lead Location: 753859
Implantable Pulse Generator Implant Date: 20180215
Lead Channel Impedance Value: 450 Ohm
Lead Channel Pacing Threshold Pulse Width: 0.5 ms
Lead Channel Pacing Threshold Pulse Width: 0.5 ms
Lead Channel Sensing Intrinsic Amplitude: 12 mV
Lead Channel Setting Pacing Amplitude: 2 V
Lead Channel Setting Pacing Amplitude: 2.5 V
Lead Channel Setting Pacing Pulse Width: 0.5 ms
Lead Channel Setting Sensing Sensitivity: 0.5 mV
MDC IDC LEAD IMPLANT DT: 20090114
MDC IDC LEAD LOCATION: 753860
MDC IDC MSMT LEADCHNL RA IMPEDANCE VALUE: 362.5 Ohm
MDC IDC MSMT LEADCHNL RA PACING THRESHOLD AMPLITUDE: 0.75 V
MDC IDC MSMT LEADCHNL RA PACING THRESHOLD AMPLITUDE: 0.75 V
MDC IDC MSMT LEADCHNL RA PACING THRESHOLD PULSEWIDTH: 0.5 ms
MDC IDC MSMT LEADCHNL RA SENSING INTR AMPL: 5 mV
MDC IDC MSMT LEADCHNL RV PACING THRESHOLD AMPLITUDE: 0.75 V
MDC IDC MSMT LEADCHNL RV PACING THRESHOLD AMPLITUDE: 0.75 V
MDC IDC MSMT LEADCHNL RV PACING THRESHOLD PULSEWIDTH: 0.5 ms
MDC IDC STAT BRADY RA PERCENT PACED: 0 %
MDC IDC STAT BRADY RV PERCENT PACED: 0 %
Pulse Gen Serial Number: 7337343

## 2017-05-10 NOTE — Patient Instructions (Addendum)
Medication Instructions:  Your physician recommends that you continue on your current medications as directed. Please refer to the Current Medication list given to you today.  -- If you need a refill on your cardiac medications before your next appointment, please call your pharmacy. --  Labwork: None ordered  Testing/Procedures: Your physician has requested that you have an echocardiogram. Echocardiography is a painless test that uses sound waves to create images of your heart. It provides your doctor with information about the size and shape of your heart and how well your heart's chambers and valves are working. This procedure takes approximately one hour. There are no restrictions for this procedure.    Follow-Up:  Your physician wants you to follow-up in: 3 months with Gypsy Balsam NP and Dr. Johney Frame in 6 months. You will receive a reminder letter in the mail two months in advance. If you don't receive a letter, please call our office to schedule the follow-up appointment.  Remote monitoring is used to monitor your ICD from home. This monitoring reduces the number of office visits required to check your device to one time per year. It allows [place] to keep an eye on the functioning of your device to ensure it is working properly. You are scheduled for a device check from home on 06/14/2017. You may send your transmission at any time that day. If you have a wireless device, the transmission will be sent automatically. After your physician reviews your transmission, you will receive a postcard with your next transmission date.     Thank you for choosing [facility]!!   [phone]  Any Other Special Instructions Will Be Listed Below (If Applicable).
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
## 2017-05-24 ENCOUNTER — Ambulatory Visit (INDEPENDENT_AMBULATORY_CARE_PROVIDER_SITE_OTHER): Payer: PRIVATE HEALTH INSURANCE | Admitting: Family Medicine

## 2017-05-24 ENCOUNTER — Encounter: Payer: Self-pay | Admitting: Family Medicine

## 2017-05-24 ENCOUNTER — Ambulatory Visit (INDEPENDENT_AMBULATORY_CARE_PROVIDER_SITE_OTHER): Payer: PRIVATE HEALTH INSURANCE

## 2017-05-24 VITALS — BP 128/86 | HR 89 | Temp 98.2°F | Ht 66.0 in | Wt 256.4 lb

## 2017-05-24 DIAGNOSIS — M25561 Pain in right knee: Secondary | ICD-10-CM | POA: Diagnosis not present

## 2017-05-24 DIAGNOSIS — G8929 Other chronic pain: Secondary | ICD-10-CM | POA: Diagnosis not present

## 2017-05-24 DIAGNOSIS — E119 Type 2 diabetes mellitus without complications: Secondary | ICD-10-CM

## 2017-05-24 MED ORDER — SEMAGLUTIDE(0.25 OR 0.5MG/DOS) 2 MG/1.5ML ~~LOC~~ SOPN: 0.2500 mg | PEN_INJECTOR | SUBCUTANEOUS | 0 refills | Status: DC

## 2017-05-24 NOTE — Progress Notes (Signed)
Kelly [name] is a 31 y.o. female is here for follow up.  History of Present Illness:   HPI:   1. Chronic pain of right knee.   Patient presents with knee pain involving the right knee. Onset of the symptoms was several weeks ago. Inciting event: this is a longstanding problem which has been getting worse. Current symptoms include crepitus sensation, giving out, stiffness and swelling. Pain is aggravated by any weight bearing. Patient has had no prior knee problems. Evaluation to date: none. Treatment to date: ice and rest.    2. Type 2 diabetes mellitus without complication, without long-term current use of insulin (HCC).   Current symptoms: no polyuria or polydipsia, no chest pain, dyspnea or TIA's, no numbness, tingling or pain in extremities.  Taking medication compliantly without noted sided effects [x]   YES  []   NO   Episodes of hypoglycemia? []   YES  [x]   NO Maintaining a diabetic diet? []   YES  [x]   NO Trying to exercise on a regular basis? []   YES  [x]   NO  On ACE inhibitor or angiotensin II receptor blocker? []   YES  [x]   NO On Aspirin? []   YES  [x]   NO  Lab Results  Component Value Date   HGBA1C 10.3 04/22/2017    No results found for: MICROALBUR, MALB24HUR  No results found for: CHOL, HDL, LDLCALC, LDLDIRECT, TRIG, CHOLHDL    Wt Readings from Last 3 Encounters:  05/24/17 256 lb 6.4 oz (116.3 kg)  05/10/17 259 lb 6.4 oz (117.7 kg)  04/26/17 252 lb (114.3 kg)   BP Readings from Last 3 Encounters:  05/24/17 128/86  05/10/17 126/90  04/26/17 122/76   Lab Results  Component Value Date   CREATININE 0.69 04/01/2017      Health Maintenance Due  Topic Date Due  . OPHTHALMOLOGY EXAM  09/20/1993  . URINE MICROALBUMIN  09/20/1993   Depression screen PHQ 2/9 04/22/2017  Decreased Interest 0  Down, Depressed, Hopeless 0  PHQ - 2 Score 0   PMHx, SurgHx, SocialHx, FamHx, Medications, and Allergies were reviewed in the Visit Navigator and updated as  appropriate.   Patient Active Problem List   Diagnosis Date Noted  . PVC's (premature ventricular contractions) 04/06/2017  . Excessive daytime sleepiness 03/21/2017  . Congestive heart failure (HCC) 12/21/2016  . Memory difficulty 12/21/2016  . BMI 40.0-44.9, adult (HCC) 03/29/2015  . PCOS (polycystic ovarian syndrome) 03/29/2015  . Nicotine dependence in remission 03/29/2015  . Dry eyes 02/15/2014  . AICD (automatic cardioverter/defibrillator) present 06/17/2013  . Cardiomyopathy, dilated, nonischemic (HCC) 06/17/2013  . Hypertension 12/21/2011  . Diabetes mellitus (HCC) 05/13/2010   Social History   Tobacco Use  . Smoking status: Former Smoker    Packs/day: 0.50    Years: 10.00    Pack years: 5.00    Types: Cigarettes    Last attempt to quit: 04/06/2017    Years since quitting: 0.1  . Smokeless tobacco: Never Used  Substance Use Topics  . Alcohol use: Yes    Comment: 04/06/2017 "might have a couple drinks q couple weeks"  . Drug use: Yes    Types: Marijuana    Comment: 04/06/2017 "maybe monthly"   Current Medications and Allergies:   .  acetaminophen (TYLENOL) 500 MG tablet, Take 1,000 mg by mouth every 6 (six) hours as needed for moderate pain or headache., Disp: , Rfl:  .  carvedilol (COREG) 12.5 MG tablet, Take 1 tablet (12.5 mg total) by mouth  2 (two) times daily with a meal., Disp: 30 tablet, Rfl: 1 .  eplerenone (INSPRA) 25 MG tablet, Take 1 tablet (25 mg total) by mouth daily., Disp: 30 tablet, Rfl: 6 .  Magnesium Oxide 200 MG TABS, Take 1 tablet (200 mg total) by mouth 2 (two) times daily., Disp: 60 tablet, Rfl: 6 .  metFORMIN (GLUCOPHAGE) 500 MG tablet, Take 1 tablet (500 mg total) by mouth 2 (two) times daily with a meal., Disp: 60 tablet, Rfl: 1 .  potassium chloride SA (K-DUR,KLOR-CON) 20 MEQ tablet, Take 20-40 mEq by mouth See admin instructions. Take 20 MEQ by mouth in the morning and 40 MEQ in the evening, Disp: , Rfl:    Allergies  Allergen Reactions  .  Aldactone [Spironolactone] Shortness Of Breath  . Latex Rash   Review of Systems   Pertinent items are noted in the HPI. Otherwise, ROS is negative.  Vitals:   Vitals:   05/24/17 1426  BP: 128/86  Pulse: 89  Temp: 98.2 F (36.8 C)  TempSrc: Oral  SpO2: 99%  Weight: 256 lb 6.4 oz (116.3 kg)  Height: 5\' 6"  (1.676 m)     Body mass index is 41.38 kg/m.   Physical Exam:   Physical Exam  Constitutional: She appears well-nourished.  HENT:  Head: Normocephalic and atraumatic.  Eyes: EOM are normal. Pupils are equal, round, and reactive to light.  Neck: Normal range of motion. Neck supple.  Cardiovascular: Normal rate, regular rhythm, normal heart sounds and intact distal pulses.  Pulmonary/Chest: Effort normal.  Abdominal: Soft.  Musculoskeletal:  Pain with palpation at superior edge of patella.  Skin: Skin is warm.  Psychiatric: She has a normal mood and affect. Her behavior is normal.  Nursing note and vitals reviewed.   Assessment and Plan:   Diagnoses and all orders for this visit:  Chronic pain of right knee Comments: Worsening. Xray pending. Hx of MRI. Sending to Dr. Berline Choughigby for further evaluation. Continue Tyelnol and ACE/ice until then. Orders: -     DG Knee Complete 4 Views Right -     Ambulatory referral to Sports Medicine  Type 2 diabetes mellitus without complication, without long-term current use of insulin (HCC) Comments: Improving with Trulicity but copay is $500. Will trial Ozempic instead with coupon. Sample provided today. Orders: -     Semaglutide (OZEMPIC) 0.25 or 0.5 MG/DOSE SOPN; Inject 0.25 mg once a week into the skin.   . Reviewed expectations re: course of current medical issues. . Discussed self-management of symptoms. . Outlined signs and symptoms indicating need for more acute intervention. . Patient verbalized understanding and all questions were answered. Marland Kitchen. Health Maintenance issues including appropriate healthy diet, exercise, and  smoking avoidance were discussed with patient. . See orders for this visit as documented in the electronic medical record. . Patient received an After Visit Summary.  Helane RimaErica Elison Worrel, DO [place] [place] 05/24/2017  Future Appointments  Date Time Provider Department Center  06/14/2017  8:40 AM [facility] DEVICE REMOTES [facility]  07/28/2017  9:30 AM [facility]  08/11/2017  9:40 AM Marily LenteSeiler, Amber K, NP [facility]

## 2017-05-26 ENCOUNTER — Ambulatory Visit (INDEPENDENT_AMBULATORY_CARE_PROVIDER_SITE_OTHER): Payer: PRIVATE HEALTH INSURANCE | Admitting: Sports Medicine

## 2017-05-26 ENCOUNTER — Encounter: Payer: Self-pay | Admitting: Sports Medicine

## 2017-05-26 ENCOUNTER — Ambulatory Visit: Payer: Self-pay

## 2017-05-26 VITALS — BP 122/84 | HR 92 | Ht 66.0 in | Wt 260.8 lb

## 2017-05-26 DIAGNOSIS — M1711 Unilateral primary osteoarthritis, right knee: Secondary | ICD-10-CM | POA: Diagnosis not present

## 2017-05-26 DIAGNOSIS — M25561 Pain in right knee: Secondary | ICD-10-CM

## 2017-05-26 DIAGNOSIS — Z6841 Body Mass Index (BMI) 40.0 and over, adult: Secondary | ICD-10-CM | POA: Diagnosis not present

## 2017-05-26 DIAGNOSIS — M25461 Effusion, right knee: Secondary | ICD-10-CM

## 2017-05-26 NOTE — Assessment & Plan Note (Signed)
Patellofemoral changes.  Aspiration injection performed today.  Body Helix compression sleeve and therapeutic exercises reviewed.  Follow-up as needed.
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
## 2017-05-26 NOTE — Procedures (Signed)
PROCEDURE NOTE: THERAPEUTIC EXERCISES (97110) 15 minutes spent for Therapeutic exercises as below and as referenced in the AVS. This included exercises focusing on stretching, strengthening, with significant focus on eccentric aspects.  Proper technique shown and discussed handout in great detail with ATC. All questions were discussed and answered.   Long term goals include an improvement in range of motion, strength, endurance as well as avoiding reinjury. Frequency of visits is one time as determined during today's  office visit. Frequency of exercises to be performed is as per handout.  EXERCISES REVIEWED:  VMO strengthening and hip abduction strengthening  

## 2017-05-26 NOTE — Procedures (Signed)
PROCEDURE NOTE - ULTRASOUND GUIDED ASPIRATION & INJECTION: RIGHT KNEE Images were obtained and interpreted by myself, Gaspar Bidding, DO  Images have been saved and stored to PACS system. Images obtained on: GE S7 Ultrasound machine  ULTRASOUND FINDINGS: Mild degenerative spurring of the patellofemoral joint.  Only a small amount of degenerative bulging medial meniscus, moderate bulging of the lateral.  Large joint effusion.  DESCRIPTION OF PROCEDURE:  The patient's clinical condition is marked by substantial pain and/or significant functional disability. Other conservative therapy has not provided relief, is contraindicated, or not appropriate. There is a reasonable likelihood that injection will significantly improve the patient's pain and/or functional impairment. After discussing the risks, benefits and expected outcomes of the injection and all questions were reviewed and answered, the patient wished to undergo the above named procedure. Verbal consent was obtained. The ultrasound was used to identify the target structure and adjacent neurovascular structures. The skin was then prepped in sterile fashion and the target structure was injected under direct visualization using sterile technique as below: PREP: Alcohol, Ethel Chloride, 3cc 1% lidocaine on 25 needle APPROACH: Superiolateral, stopcock technique, 18g 1.5" needle INJECTATE: 2 cc 0.5% marcaine, 2 cc 40mg  DepoMedrol ASPIRATE: 25cc serous aspirate DRESSING: Band-Aid and full knee Body Helix  Post procedural instructions including recommending icing and warning signs for infection were reviewed. This procedure was well tolerated and there were no complications.   IMPRESSION: Succesful US Guided Aspiration & injection

## 2017-05-26 NOTE — Patient Instructions (Addendum)
You had an injection today.  Things to be aware of after injection are listed below: . You may experience no significant improvement or even a slight worsening in your symptoms during the first 24 to 48 hours.  After that we expect your symptoms to improve gradually over the next 2 weeks for the medicine to have its maximal effect.  You should continue to have improvement out to 6 weeks after your injection. . Dr. Berline Chough recommends icing the site of the injection for 20 minutes  1-2 times the day of your injection . You may shower but no swimming, tub bath or Jacuzzi for 24 hours. . If your bandage falls off this does not need to be replaced.  It is appropriate to remove the bandage after 4 hours. . You may resume light activities as tolerated unless otherwise directed per Dr. Berline Chough during your visit  POSSIBLE STEROID SIDE EFFECTS:  Side effects from injectable steroids tend to be less than when taken orally however you may experience some of the symptoms listed below.  If experienced these should only last for a short period of time. Change in menstrual flow  Edema (swelling)  Increased appetite Skin flushing (redness)  Skin rash/acne  Thrush (oral) Yeast vaginitis    Increased sweating  Depression Increased blood glucose levels Cramping and leg/calf  Euphoria (feeling happy)  POSSIBLE PROCEDURE SIDE EFFECTS: The side effects of the injection are usually fairly minimal however if you may experience some of the following side effects that are usually self-limited and will is off on their own.  If you are concerned please feel free to call the office with questions:  Increased numbness or tingling  Nausea or vomiting  Swelling or bruising at the injection site   Please call our office if if you experience any of the following symptoms over the next week as these can be signs of infection:   Fever greater than 100.58F  Significant swelling at the injection site  Significant redness or drainage  from the injection site  If after 2 weeks you are continuing to have worsening symptoms please call our office to discuss what the next appropriate actions should be including the potential for a return office visit or other diagnostic testing.  Please perform the exercise program that we have prepared for you and gone over in detail on a daily basis.  In addition to the handout you were provided you can access your program through: [email or web address]   Your unique program code is: [email or web address]   I recommend you obtained a compression sleeve to help with your joint problems. There are many options on the market however I recommend obtaining a knee Body Helix compression sleeve.  You can find information (including how to appropriate measure yourself for sizing) can be found at www.Body GrandRapidsWifi.ch.  Many of these products are health savings account (HSA) eligible.   You can use the compression sleeve at any time throughout the day but is most important to use while being active as well as for 2 hours post-activity.   It is appropriate to ice following activity with the compression sleeve in place.

## 2017-05-27 ENCOUNTER — Encounter: Payer: Self-pay | Admitting: Family Medicine

## 2017-05-28 ENCOUNTER — Ambulatory Visit (INDEPENDENT_AMBULATORY_CARE_PROVIDER_SITE_OTHER): Payer: PRIVATE HEALTH INSURANCE

## 2017-05-28 DIAGNOSIS — Z111 Encounter for screening for respiratory tuberculosis: Secondary | ICD-10-CM | POA: Diagnosis not present

## 2017-05-28 NOTE — Progress Notes (Signed)
Noted and agree. Kelly [name]  

## 2017-06-01 ENCOUNTER — Encounter: Payer: PRIVATE HEALTH INSURANCE | Admitting: Family Medicine

## 2017-06-14 ENCOUNTER — Telehealth: Payer: Self-pay | Admitting: Cardiology

## 2017-06-14 ENCOUNTER — Encounter: Payer: PRIVATE HEALTH INSURANCE | Admitting: *Deleted

## 2017-06-14 NOTE — Telephone Encounter (Signed)
LMOVM reminding pt to send remote transmission.   

## 2017-06-17 ENCOUNTER — Encounter: Payer: Self-pay | Admitting: Cardiology

## 2017-06-18 ENCOUNTER — Telehealth: Payer: Self-pay | Admitting: Internal Medicine

## 2017-06-18 NOTE — Telephone Encounter (Signed)
Spoke with patient and explained that we did not successfully receive her remote transmission. She works night shift and was awakened by my call so she asked that I give her the tech services number and she would call at her earliest convenience. Tech services number given to patient.

## 2017-06-18 NOTE — Telephone Encounter (Signed)
Spoke with patient and explained that we did not receive her transmission she sent on Monday. Walked patient though how to send a manual transmission. I will call her back if we do not receive it. Patient was agreeable to this plan

## 2017-06-18 NOTE — Telephone Encounter (Signed)
New message ° ° ° ° °1. Has your device fired? NO ° °2. Is you device beeping? NO ° °3. Are you experiencing draining or swelling at device site? NO ° °4. Are you calling to see if we received your device transmission? YES ° °5. Have you passed out? NO ° ° ° °Please route to Device Clinic Pool °

## 2017-06-24 ENCOUNTER — Ambulatory Visit (INDEPENDENT_AMBULATORY_CARE_PROVIDER_SITE_OTHER): Payer: PRIVATE HEALTH INSURANCE | Admitting: Sports Medicine

## 2017-06-24 ENCOUNTER — Encounter: Payer: Self-pay | Admitting: Family Medicine

## 2017-06-24 ENCOUNTER — Encounter: Payer: Self-pay | Admitting: Sports Medicine

## 2017-06-24 ENCOUNTER — Ambulatory Visit (INDEPENDENT_AMBULATORY_CARE_PROVIDER_SITE_OTHER): Payer: PRIVATE HEALTH INSURANCE

## 2017-06-24 VITALS — BP 138/90 | HR 80 | Ht 66.0 in | Wt 256.0 lb

## 2017-06-24 DIAGNOSIS — M545 Low back pain, unspecified: Secondary | ICD-10-CM

## 2017-06-24 DIAGNOSIS — M1711 Unilateral primary osteoarthritis, right knee: Secondary | ICD-10-CM | POA: Diagnosis not present

## 2017-06-24 DIAGNOSIS — Z95810 Presence of automatic (implantable) cardiac defibrillator: Secondary | ICD-10-CM | POA: Diagnosis not present

## 2017-06-24 DIAGNOSIS — Z6841 Body Mass Index (BMI) 40.0 and over, adult: Secondary | ICD-10-CM | POA: Diagnosis not present

## 2017-06-24 DIAGNOSIS — G8929 Other chronic pain: Secondary | ICD-10-CM | POA: Insufficient documentation

## 2017-06-24 MED ORDER — BACLOFEN 10 MG PO TABS: ORAL_TABLET | ORAL | 1 refills | Status: DC

## 2017-06-24 MED ORDER — DICLOFENAC SODIUM 2 % TD SOLN: 2.0000 "application " | Freq: Two times a day (BID) | TRANSDERMAL | 0 refills | Status: AC

## 2017-06-24 NOTE — Assessment & Plan Note (Signed)
Small effusion at last visit.  We will have her try topical anti-inflammatory today to see if this provides her benefit.  Unfortunately we do need to avoid NSAIDs given her cardiac history although no absolute contraindication.  Can send in a prescription for Voltaren gel if helpful.  Reaction knee brace tried today. Quadricep and hip are weakened she will benefit from strengthening with physical therapy  Unfortunately given her AICD she is not a candidate for an MRI but if any persistent ongoing symptoms or more overt mechanical symptoms consideration of diagnostic arthroscopy could be considered

## 2017-06-24 NOTE — Assessment & Plan Note (Signed)
Back pain does seem to be functional in nature and likely related to altered gait mechanics from her knee pain.  Physical therapy will be beneficial for this and referral has been placed. Baclofen for muscle relaxant

## 2017-06-24 NOTE — Progress Notes (Signed)
Kelly FellsMichael D. [name] Shinerigby, DO  [facility] [place] [phone]  Kelly HolsterShaiterria L [name] - 31 y.o. female MRN [id number]  Date of birth: 03/26/1986   Scribe for today's visit: Kelly FabianMolly [name], ATC    SUBJECTIVE:  Kelly MunroShaiterria L [name] is here for Follow-up (R knee pain and effusion) .   Kelly ExonShaiterria was a new patient who presented on 05/26/17 for evaluation of RT knee pain. Pain is above and below the patella and pt reports that it feels like it is behind the knee cap. Her knee has given out on her twice over the past week and one of those times it caused her to fall while she was going up stairs. She has noticed swelling around the knee. She has noticed occasional clicking and popping in the knee.  RT knee pain has been present since Sept 2018.  No known injury or trauma to the knee.   The pain is described as constant dull ache with occasional throbbing and tightness. She feels like her knee cap is "floating around". Pain is rated as 4/10.   Compared to the last office visit on 05/26/17, her previously described R knee pain and swelling symptoms were better for about 1.5 weeks after her last visit but notes that the pain has started to return.  Pain remains right above and below the patella.  Swelling and popping/clicking remain. Current symptoms are moderate & are nonradiating.  Worse w/ walking or being active and improves w/ rest. She has been doing HEP 3x/week, using a compression wrap while at work, has used ice/heat and [employer] Hot with temporary relief and using Tylenol (1500-2000 mg qAM and 1000-1500 mg qPM).  Pt states that she has some soreness along her R adductor/groin and also reports some R-sided back pain.   ROS Reports night time disturbances. Denies fevers, chills, or night sweats. Denies unexplained weight loss. Denies personal history of cancer. Denies changes in bowel or bladder habits. Denies recent unreported falls. Denies new or worsening  dyspnea or wheezing. Denies headaches or dizziness.  Denies numbness, tingling or weakness  In the extremities.  Denies dizziness or presyncopal episodes Reports lower extremity edema     HISTORY & PERTINENT PRIOR DATA:  Prior History reviewed and updated per electronic medical record.  Significant history, findings, studies and interim changes include:  reports that she quit smoking about 2 months ago. Her smoking use included cigarettes. She has a 5.00 pack-year smoking history. she has never used smokeless tobacco. Recent Labs    12/21/16 1535 04/22/17 1123  HGBA1C 9.2* 10.3   AICD is not Pacemaker compatable Problem  Right-Sided Low Back Pain Without Sciatica   X-rays on 06/24/2017 show very mild degenerative disc disease at L1 and L2.  Otherwise well aligned.  No significant degenerative changes   Primary Osteoarthritis of Right Knee   05/24/2017: Weightbearing 3 view x-rays show mainly patellofemoral wear. Reaction knee brace   Aicd (Automatic Cardioverter/Defibrillator) Present   Reported as not MRI compatible Results for orders placed or performed in visit on 12/15/16  [facility]  Result Value Ref Range   Pulse Generator [facility]    Date Time Interrogation Session [id number]    Pulse Gen Model 2357-40C Fortify [address] DR    Pulse Gen Serial Number [id number]    [facility]    Implantable Pulse Generator Type Implantable Cardiac Defibulator    Implantable Pulse Generator Implant Date [id number]    Implantable Lead  Manufacturer [facility]    Implantable Lead Model 1888TC Tendril ST Optim    Implantable Lead Serial Number [id number]    Implantable Lead Implant Date [id number]    Implantable Lead Location Detail 1 UNKNOWN    Implantable Lead Location [id number]    Implantable Lead Manufacturer [facility]    Implantable Lead Model [id number] Durata    Implantable Lead Serial Number [id number]    Implantable Lead Implant Date [id number]     Implantable Lead Location [id number]           OBJECTIVE:  VS:  HT:5\' 6"  (167.6 cm)   WT:256 lb (116.1 kg)  BMI:41.34    BP:138/90  HR:80bpm  TEMP: ( )  RESP:96 %  PHYSICAL EXAM: Constitutional: WDWN, Non-toxic appearing. Psychiatric: Alert & appropriately interactive. Not depressed or anxious appearing. Respiratory: No increased work of breathing. Trachea Midline Eyes: Pupils are equal. EOM intact without nystagmus. No scleral icterus  Back: Overall well aligned.  No significant scoliosis.  She does have right greater than left paraspinal muscle spasms.  Somewhat limited side bending and rotation. Lower extremity reflexes are trace diffusely and symmetric.  Right knee: Guarded with 4 out of 5 weakness in bilateral lower extremities with hip flexion, knee extension knee flexion. She is ligamentously stable although has pain.  Anterior posterior drawer stable.  Pain with McMurray's but no reproducible mechanical click today.  ASSESSMENT & PLAN:   1. AICD (automatic cardioverter/defibrillator) present   2. Primary osteoarthritis of right knee   3. BMI 40.0-44.9, adult (HCC)   4. Right-sided low back pain without sciatica, unspecified chronicity    PLAN:     Right-sided low back pain without sciatica Back pain does seem to be functional in nature and likely related to altered gait mechanics from her knee pain.  Physical therapy will be beneficial for this and referral has been placed. Baclofen for muscle relaxant  Primary osteoarthritis of right knee Small effusion at last visit.  We will have her try topical anti-inflammatory today to see if this provides her benefit.  Unfortunately we do need to avoid NSAIDs given her cardiac history although no absolute contraindication.  Can send in a prescription for Voltaren gel if helpful.  Reaction knee brace tried today. Quadricep and hip are weakened she will benefit from strengthening with physical therapy  Unfortunately given her  AICD she is not a candidate for an MRI but if any persistent ongoing symptoms or more overt mechanical symptoms consideration of diagnostic arthroscopy could be considered   ++++++++++++++++++++++++++++++++++++++++++++ Orders & Meds: Orders Placed This Encounter  Procedures  . DG Lumbar Spine Complete  . Ambulatory referral to Physical Therapy    Meds ordered this encounter  Medications  . Diclofenac Sodium (PENNSAID) 2 % SOLN    Sig: Place 2 application onto the skin 2 (two) times daily for 1 day.    Dispense:  8 g    Refill:  0  . baclofen (LIORESAL) 10 MG tablet    Sig: Take 1 tablet (10 mg total) by mouth at bedtime. May also take 1 tablet (10 mg total) 2 (two) times daily as needed for muscle spasms.    Dispense:  60 tablet    Refill:  1    ++++++++++++++++++++++++++++++++++++++++++++ Follow-up: Return in about 6 weeks (around 08/05/2017).   Pertinent documentation may be included in additional procedure notes, imaging studies, problem based documentation and patient instructions. Please see these sections of the encounter for additional information regarding this visit. CMA/ATC served as Neurosurgeon  during this visit. History, Physical, and Plan performed by medical provider. Documentation and orders reviewed and attested to.      Andrena Mews, DO     Sports Medicine Physician

## 2017-07-01 ENCOUNTER — Ambulatory Visit: Payer: PRIVATE HEALTH INSURANCE | Admitting: Family Medicine

## 2017-07-05 ENCOUNTER — Ambulatory Visit (INDEPENDENT_AMBULATORY_CARE_PROVIDER_SITE_OTHER): Payer: PRIVATE HEALTH INSURANCE | Admitting: Physical Therapy

## 2017-07-05 ENCOUNTER — Ambulatory Visit (INDEPENDENT_AMBULATORY_CARE_PROVIDER_SITE_OTHER): Payer: PRIVATE HEALTH INSURANCE | Admitting: Family Medicine

## 2017-07-05 ENCOUNTER — Encounter: Payer: Self-pay | Admitting: Family Medicine

## 2017-07-05 ENCOUNTER — Ambulatory Visit (INDEPENDENT_AMBULATORY_CARE_PROVIDER_SITE_OTHER): Payer: PRIVATE HEALTH INSURANCE

## 2017-07-05 ENCOUNTER — Other Ambulatory Visit: Payer: Self-pay

## 2017-07-05 ENCOUNTER — Encounter: Payer: Self-pay | Admitting: Physician Assistant

## 2017-07-05 ENCOUNTER — Telehealth: Payer: Self-pay | Admitting: Internal Medicine

## 2017-07-05 VITALS — BP 140/88 | HR 90 | Temp 98.3°F | Wt 261.2 lb

## 2017-07-05 DIAGNOSIS — R1084 Generalized abdominal pain: Secondary | ICD-10-CM

## 2017-07-05 DIAGNOSIS — M25661 Stiffness of right knee, not elsewhere classified: Secondary | ICD-10-CM

## 2017-07-05 DIAGNOSIS — M545 Low back pain, unspecified: Secondary | ICD-10-CM

## 2017-07-05 DIAGNOSIS — R1112 Projectile vomiting: Secondary | ICD-10-CM | POA: Diagnosis not present

## 2017-07-05 DIAGNOSIS — Z13220 Encounter for screening for lipoid disorders: Secondary | ICD-10-CM | POA: Diagnosis not present

## 2017-07-05 DIAGNOSIS — E119 Type 2 diabetes mellitus without complications: Secondary | ICD-10-CM

## 2017-07-05 DIAGNOSIS — Z3202 Encounter for pregnancy test, result negative: Secondary | ICD-10-CM

## 2017-07-05 DIAGNOSIS — M25561 Pain in right knee: Secondary | ICD-10-CM | POA: Diagnosis not present

## 2017-07-05 LAB — COMPREHENSIVE METABOLIC PANEL
ALT: 46 U/L — ABNORMAL HIGH (ref 0–35)
AST: 31 U/L (ref 0–37)
Albumin: 4.1 g/dL (ref 3.5–5.2)
Alkaline Phosphatase: 78 U/L (ref 39–117)
BUN: 9 mg/dL (ref 6–23)
CO2: 25 mEq/L (ref 19–32)
Calcium: 9.5 mg/dL (ref 8.4–10.5)
Chloride: 101 mEq/L (ref 96–112)
Creatinine, Ser: 0.66 mg/dL (ref 0.40–1.20)
GFR: 132.01 mL/min (ref >60.00)
Glucose, Bld: 263 mg/dL — ABNORMAL HIGH (ref 70–99)
Potassium: 4.1 mEq/L (ref 3.5–5.1)
Sodium: 134 mEq/L — ABNORMAL LOW (ref 135–145)
Total Bilirubin: 0.4 mg/dL (ref 0.2–1.2)
Total Protein: 7.5 g/dL (ref 6.0–8.3)

## 2017-07-05 LAB — MICROALBUMIN / CREATININE URINE RATIO
Creatinine,U: 58.8 mg/dL
Microalb Creat Ratio: 1.2 mg/g (ref 0.0–30.0)
Microalb, Ur: <0.7 mg/dL (ref 0.0–1.9)

## 2017-07-05 LAB — CBC WITH DIFFERENTIAL/PLATELET
Basophils Absolute: 0.1 10*3/uL (ref 0.0–0.1)
Basophils Relative: 0.6 % (ref 0.0–3.0)
Eosinophils Absolute: 0.4 10*3/uL (ref 0.0–0.7)
Eosinophils Relative: 4.3 % (ref 0.0–5.0)
HCT: 39.9 % (ref 36.0–46.0)
Hemoglobin: 13.1 g/dL (ref 12.0–15.0)
Lymphocytes Relative: 26.5 % (ref 12.0–46.0)
Lymphs Abs: 2.5 10*3/uL (ref 0.7–4.0)
MCHC: 32.9 g/dL (ref 30.0–36.0)
MCV: 87.6 fl (ref 78.0–100.0)
Monocytes Absolute: 0.9 10*3/uL (ref 0.1–1.0)
Monocytes Relative: 9.3 % (ref 3.0–12.0)
Neutro Abs: 5.6 10*3/uL (ref 1.4–7.7)
Neutrophils Relative %: 59.3 % (ref 43.0–77.0)
Platelets: 281 10*3/uL (ref 150.0–400.0)
RBC: 4.56 Mil/uL (ref 3.87–5.11)
RDW: 12.9 % (ref 11.5–15.5)
WBC: 9.4 10*3/uL (ref 4.0–10.5)

## 2017-07-05 LAB — LDL CHOLESTEROL, DIRECT: Direct LDL: 130 mg/dL

## 2017-07-05 LAB — LIPID PANEL
Cholesterol: 189 mg/dL (ref 0–200)
HDL: 42.7 mg/dL (ref >39.00)
NonHDL: 146.39
Total CHOL/HDL Ratio: 4
Triglycerides: 237 mg/dL — ABNORMAL HIGH (ref 0.0–149.0)
VLDL: 47.4 mg/dL — ABNORMAL HIGH (ref 0.0–40.0)

## 2017-07-05 MED ORDER — DAPAGLIFLOZIN PROPANEDIOL 5 MG PO TABS: 5.0000 mg | ORAL_TABLET | Freq: Every day | ORAL | 0 refills | Status: DC

## 2017-07-05 NOTE — Telephone Encounter (Signed)
Spoke with pt, pt stated that she did not receive a new card in the mail from [place]. Jude after her gen change in 08/2016. Pt given the number to [facility]. Jude registration to call and have a new card sent. Pt requested a letter stating she had a device as she was going though security on Sunday. Letter left at front desk for pt to pick up.

## 2017-07-05 NOTE — Telephone Encounter (Signed)
New  Message  Pt call requesting to speak with RN. Pt would discuss upon call back

## 2017-07-05 NOTE — Patient Instructions (Signed)
HIP: Hamstrings - Short Sitting    Rest leg on raised surface. Keep knee straight. Lift chest. Hold _30__ seconds. _3__ reps per set, _2-3__ sets per day, _6-7__ days per week   Knee to Chest (Flexion)    Pull knee toward chest. Feel stretch in lower back or buttock area. Breathing deeply, Hold _30___ seconds. Repeat with other knee. Repeat __3__ times. Do __2-3__ sessions per day.  Hip Extension    Lie on back, left leg on the floor or bed and right foot propped on left knee, knees bent. Use strap and pull knee towards opposite shoulder. Hold _30___ seconds. Repeat with other leg held. Repeat __3_ times. Do __2-3__ sessions per day.   Lower Trunk Rotation Stretch    Keeping back flat and feet together, rotate knees to left side. Hold __30__ seconds. Repeat __3__ times per set. Do __1__ sets per session. Do __2-3__ sessions per day.   Trigger Point Dry Needling  . What is Trigger Point Dry Needling (DN)? o DN is a physical therapy technique used to treat muscle pain and dysfunction. Specifically, DN helps deactivate muscle trigger points (muscle knots).  o A thin filiform needle is used to penetrate the skin and stimulate the underlying trigger point. The goal is for a local twitch response (LTR) to occur and for the trigger point to relax. No medication of any kind is injected during the procedure.   . What Does Trigger Point Dry Needling Feel Like?  o The procedure feels different for each individual patient. Some patients report that they do not actually feel the needle enter the skin and overall the process is not painful. Very mild bleeding may occur. However, many patients feel a deep cramping in the muscle in which the needle was inserted. This is the local twitch response.   Marland Kitchen How Will I feel after the treatment? o Soreness is normal, and the onset of soreness may not occur for a few hours. Typically this soreness does not last longer than two days.  o Bruising is  uncommon, however; ice can be used to decrease any possible bruising.  o In rare cases feeling tired or nauseous after the treatment is normal. In addition, your symptoms may get worse before they get better, this period will typically not last longer than 24 hours.   . What Can I do After My Treatment? o Increase your hydration by drinking more water for the next 24 hours. o You may place ice or heat on the areas treated that have become sore, however, do not use heat on inflamed or bruised areas. Heat often brings more relief post needling. o You can continue your regular activities, but vigorous activity is not recommended initially after the treatment for 24 hours. o DN is best combined with other physical therapy such as strengthening, stretching, and other therapies.

## 2017-07-05 NOTE — Telephone Encounter (Signed)
Ms. Kelly [name] was seen in the office today.  She phoned to request that the packet she emailed be completed and faxed to the [facility] fax number on the form. Please advise patient when completed via MyChart or TC.

## 2017-07-05 NOTE — Telephone Encounter (Signed)
Pt asking for letter stating that she has a pacemaker, she needs letter so she is able to go through security.  Pt advised I will forward to [facility] to see if they will be able to provide to her letter/information.
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
## 2017-07-05 NOTE — Progress Notes (Signed)
Kelly [name] is a 31 y.o. female here for an acute visit.  History of Present Illness:   HPI: Today, patient presents with concern for several episodes of acute nausea, associated with vomiting sometimes described as projectile vomiting, with abdominal distention and discomfort that is generalized, burping with a sulfur smell, then followed by loose stools.  Patient does have intermittent constipation but denies having constipation prior to these acute episodes.  She states that after this happens, she has no bowel movement for 3 days.  She denies any hematochezia, hematemesis, or melena.  The patient states that she had something similar to this happen a few years ago.  She was admitted to the hospital because she also had an accompanying fever.  She states that there was no diagnosis of acute abdominal issues.  Her last CT abdomen was 2016 and was normal with the exception of fatty liver.  She states that taking a probiotic will generally help lessen these episodes.  She endorses fairly chronic low level nausea.  These episodes seem to happen every few months but has flared more recently with 3 episodes in the past month.  She has a history of uncontrolled diabetes - her last A1c was 10.  She was recently started by me on a GLP-1.  She is taking as empiric 0.52 mg weekly and gets that injection on Tuesdays.  She denies noting any relation of time of injection to the above symptoms.  She has never been diagnosed with gastroparesis, IBS, or other abdominal disorders.  She does still have a gallbladder.  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  Her fasting blood sugars are taken around 3 PM and are usually in the 130s.  She has had 2 blood sugars over 250 the past few weeks.  PMHx, SurgHx, SocialHx, Medications, and Allergies were reviewed in the Visit Navigator and updated as appropriate.  Current Medications:   .  baclofen (LIORESAL) 10 MG tablet, Take 1 tablet (10 mg  total) by mouth at bedtime. May also take 1 tablet (10 mg total) 2 (two) times daily as needed for muscle spasms., Disp: 60 tablet, Rfl: 1 .  carvedilol (COREG) 12.5 MG tablet, Take 1 tablet (12.5 mg total) by mouth 2 (two) times daily with a meal., Disp: 30 tablet, Rfl: 1 .  eplerenone (INSPRA) 25 MG tablet, Take 1 tablet (25 mg total) by mouth daily., Disp: 30 tablet, Rfl: 6 .  Magnesium Oxide 200 MG TABS, Take 1 tablet (200 mg total) by mouth 2 (two) times daily., Disp: 60 tablet, Rfl: 6 .  metFORMIN (GLUCOPHAGE) 500 MG tablet, Take 1 tablet (500 mg total) by mouth 2 (two) times daily with a meal., Disp: 60 tablet, Rfl: 1 .  potassium chloride SA (K-DUR,KLOR-CON) 20 MEQ tablet, Take 20-40 mEq by mouth See admin instructions. Take 20 MEQ by mouth in the morning and 40 MEQ in the evening, Disp: , Rfl:  .  Semaglutide (OZEMPIC) 0.25 or 0.5 MG/DOSE SOPN, Inject 0.25 mg once a week into the skin. (Patient not taking: Reported on 07/05/2017), Disp: 1 pen, Rfl: 0  Allergies  Allergen Reactions  . Aldactone [Spironolactone] Shortness Of Breath  . Latex Rash   Review of Systems:   Pertinent items are noted in the HPI. Otherwise, ROS is negative.  Vitals:   Vitals:   07/05/17 0858  BP: 140/88  Pulse: 90  Temp: 98.3 F (36.8 C)  TempSrc: Oral  SpO2: 96%  Weight: 261 lb 3.2 oz (118.5 kg)     Body mass index is 42.16 kg/m.   Physical Exam:   Physical Exam  Constitutional: She appears well-nourished.  HENT:  Head: Normocephalic and atraumatic.  Eyes: EOM are normal. Pupils are equal, round, and reactive to light.  Neck: Normal range of motion. Neck supple.  Cardiovascular: Normal rate, regular rhythm, normal heart sounds and intact distal pulses.  Pulmonary/Chest: Effort normal.  Abdominal: Soft. Bowel sounds are normal. She exhibits distension. She exhibits no fluid wave. There is generalized tenderness.  Skin: Skin is warm.  Psychiatric: She has a normal mood and affect. Her  behavior is normal.  Nursing note and vitals reviewed.   Assessment and Plan:   Rashima was seen today for gas and abdominal pain.  Diagnoses and all orders for this visit:  Generalized abdominal pain Comments: EXAM: ABDOMEN - 1 VIEW  FINDINGS: The bowel gas pattern is normal. Large amount of stool seen throughout the colon. No radio-opaque calculi or other significant radiographic abnormality are seen.  IMPRESSION: Large stool burden. No evidence of bowel obstruction or ileus.  X-ray results above.  No red flags.  This is likely due to constipation.  There may be some component of gastroparesis.  We discussed bowel regimen.  See AVS.  With the severity of her symptoms, I think evaluation by gastroenterology is warranted.  We will also order a ultrasound for evaluation for gallbladder.  Labs pending. Orders: -     DG Abd 1 View; Future -     CBC with Differential/Platelet -     Comprehensive metabolic panel -     Ambulatory referral to Gastroenterology -     US ABDOMEN LIMITED RUQ; Future  Lipid screening -     Lipid panel  Type 2 diabetes mellitus without complication, without long-term current use of insulin (HCC) Comments: Patient has uncontrolled diabetes.  Her A1c was high 2 months ago.  She is due for recheck today.  Her blood sugars are still elevated during her fasting checks.  She has been on a GLP-1 for several weeks now.  Due to her above symptoms, I feel that we have to hold the Ozempic at this time.  Trial for [employer] as below.  I did let her know that this can increase her risk of urinary tract infections, and yeast infections.  I encouraged her to drink plenty of water.  Recheck in 1 month. Orders: -     Microalbumin / creatinine urine ratio -     dapagliflozin propanediol (FARXIGA) 5 MG TABS tablet; Take 5 mg by mouth daily.  Projectile vomiting with nausea -     Ambulatory referral to Gastroenterology -     US ABDOMEN LIMITED RUQ; Future   . Reviewed  expectations re: course of current medical issues. . Discussed self-management of symptoms. . Outlined signs and symptoms indicating need for more acute intervention. . Patient verbalized understanding and all questions were answered. Marland Kitchen Health Maintenance issues including appropriate healthy diet, exercise, and smoking avoidance were discussed with patient. . See orders for this visit as documented in the electronic medical record. . Patient received an After Visit Summary.  Helane Rima, DO [place] [place] 07/05/2017  Future Appointments  Date Time Provider Department Center  07/28/2017  9:30 AM [facility] 3 [id number]  08/05/2017  8:40 AM Andrena Mews, DO [facility]  08/05/2017  9:40 AM Helane Rima, DO [facility]  08/11/2017  9:40 AM  Marily LenteSeiler, Amber K, NP [facility]

## 2017-07-05 NOTE — Patient Instructions (Addendum)
HOLD OZEMPIC.  LET'S TRY FARXIGA.  MANAGEMENT OF CHRONIC CONSTIPATION   Push fluids, all day, everyday  Eat lots of high fiber foods-fruits, veggies, bran and whole grain instead of white bread  Be active everyday. Inactivity makes constipation worse.  Add psyllium daily (Metamucil) which comes in capsules now. Start very low dose and work up to recommended dose on bottle daily.  Stay away from Milk of Magnesia or any magnesium containing laxative, unless you need it to clear things out rarely. It is an addictive laxative and your gut will become dependent on it.  If that is not working, I would start Miralax, which you can buy in generic 17 gms daily. It's a powder and not an "addictive laxative". Take it every day and titrate the dose up or down to get the daily BM.

## 2017-07-06 ENCOUNTER — Encounter: Payer: Self-pay | Admitting: Family Medicine

## 2017-07-09 NOTE — Telephone Encounter (Signed)
Copied from CRM [id number]. Topic: Quick Communication - See Telephone Encounter >> Jul 09, 2017  4:33 PM Floria Raveling A wrote: CRM for notification. See Telephone encounter for: pt called and said that she wasn't requesting a handcap pacard. She uploaded a form on her mychart message she said that she needed filled out by Dr Earlene Plater.  She would like Asher Muir to give her a call on Monday    07/09/17.

## 2017-07-12 ENCOUNTER — Ambulatory Visit (INDEPENDENT_AMBULATORY_CARE_PROVIDER_SITE_OTHER): Payer: PRIVATE HEALTH INSURANCE | Admitting: Physical Therapy

## 2017-07-12 ENCOUNTER — Encounter: Payer: Self-pay | Admitting: Surgical

## 2017-07-12 ENCOUNTER — Encounter: Payer: Self-pay | Admitting: Family Medicine

## 2017-07-12 ENCOUNTER — Encounter: Payer: Self-pay | Admitting: Internal Medicine

## 2017-07-12 ENCOUNTER — Telehealth: Payer: Self-pay | Admitting: Surgical

## 2017-07-12 ENCOUNTER — Encounter: Payer: Self-pay | Admitting: Physical Therapy

## 2017-07-12 DIAGNOSIS — M25561 Pain in right knee: Secondary | ICD-10-CM | POA: Diagnosis not present

## 2017-07-12 DIAGNOSIS — M545 Low back pain, unspecified: Secondary | ICD-10-CM

## 2017-07-12 DIAGNOSIS — M25661 Stiffness of right knee, not elsewhere classified: Secondary | ICD-10-CM | POA: Diagnosis not present
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
## 2017-07-12 NOTE — Telephone Encounter (Signed)
Left message for patient that she would need to call her cardiology office to have them to fill out the [facility] paperwork due to it concerning her Pacemaker. I left the appointment on the schedule for now. I will also send a my chart message.

## 2017-07-16 ENCOUNTER — Ambulatory Visit (INDEPENDENT_AMBULATORY_CARE_PROVIDER_SITE_OTHER): Payer: PRIVATE HEALTH INSURANCE | Admitting: Family Medicine

## 2017-07-16 ENCOUNTER — Encounter: Payer: Self-pay | Admitting: Family Medicine

## 2017-07-16 VITALS — BP 124/82 | HR 91 | Temp 98.6°F | Ht 66.0 in | Wt 261.0 lb

## 2017-07-16 DIAGNOSIS — I10 Essential (primary) hypertension: Secondary | ICD-10-CM

## 2017-07-16 DIAGNOSIS — Z95810 Presence of automatic (implantable) cardiac defibrillator: Secondary | ICD-10-CM

## 2017-07-16 DIAGNOSIS — I420 Dilated cardiomyopathy: Secondary | ICD-10-CM | POA: Diagnosis not present
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
## 2017-07-19 ENCOUNTER — Encounter: Payer: Self-pay | Admitting: Sports Medicine

## 2017-07-19 ENCOUNTER — Ambulatory Visit (INDEPENDENT_AMBULATORY_CARE_PROVIDER_SITE_OTHER): Payer: PRIVATE HEALTH INSURANCE | Admitting: Sports Medicine

## 2017-07-19 ENCOUNTER — Ambulatory Visit: Payer: PRIVATE HEALTH INSURANCE | Admitting: Family Medicine

## 2017-07-19 ENCOUNTER — Encounter: Payer: Self-pay | Admitting: Physical Therapy

## 2017-07-19 ENCOUNTER — Ambulatory Visit (INDEPENDENT_AMBULATORY_CARE_PROVIDER_SITE_OTHER): Payer: PRIVATE HEALTH INSURANCE | Admitting: Physical Therapy

## 2017-07-19 VITALS — BP 120/82 | HR 88 | Ht 66.0 in | Wt 261.6 lb

## 2017-07-19 DIAGNOSIS — M545 Low back pain, unspecified: Secondary | ICD-10-CM

## 2017-07-19 DIAGNOSIS — M25661 Stiffness of right knee, not elsewhere classified: Secondary | ICD-10-CM | POA: Diagnosis not present

## 2017-07-19 DIAGNOSIS — M25561 Pain in right knee: Secondary | ICD-10-CM

## 2017-07-19 DIAGNOSIS — I5022 Chronic systolic (congestive) heart failure: Secondary | ICD-10-CM

## 2017-07-19 DIAGNOSIS — Z95810 Presence of automatic (implantable) cardiac defibrillator: Secondary | ICD-10-CM

## 2017-07-19 DIAGNOSIS — E119 Type 2 diabetes mellitus without complications: Secondary | ICD-10-CM

## 2017-07-19 DIAGNOSIS — I10 Essential (primary) hypertension: Secondary | ICD-10-CM

## 2017-07-19 MED ORDER — TRAMADOL HCL 50 MG PO TABS: 50.0000 mg | ORAL_TABLET | Freq: Four times a day (QID) | ORAL | 0 refills | Status: DC | PRN

## 2017-07-19 MED ORDER — BACLOFEN 10 MG PO TABS: 10.0000 mg | ORAL_TABLET | Freq: Three times a day (TID) | ORAL | 1 refills | Status: AC

## 2017-07-19 NOTE — Progress Notes (Signed)
Kelly [name]. [name] at [facility] [phone]  Kelly [name] - 31 y.o. female MRN [id number]  Date of birth: Feb 29, 1986  Visit Date: 07/19/2017  PCP: Helane Rima, DO   Referred by: Helane Rima, DO   Scribe for today's visit: Stevenson Clinch, CMA     SUBJECTIVE:  Kelly [name] "Pumpkin" is here for Follow-up (back pain)  Compared to the last office visit, her previously described symptoms are worsening. Current symptoms are moderate-severe aching and stabbing & are radiating to mid and upper back. Also having pain in the gluteal region but denies pain in the groin.  She has been taking Baclofen 1-2 x daily and Tylenol 2000-3000 mg. She has tried using Pennsaid on her back with minimal relief. She has tried using Biofreeze and heat with temporary relief. She has been doing PT with Judeth Cornfield with some relief. She does home exercises with some pain. Her job does cause her pain to flare up at times. She got a pillow top mattress and new pillows for her bed and hasn't gotten much relief.    ROS Reports night time disturbances. Reports chills, or night sweats. Denies unexplained weight loss. Denies personal history of cancer. Denies changes in bowel or bladder habits. Denies recent unreported falls. Denies new or worsening dyspnea or wheezing. Denies headaches or dizziness.  Reports numbness, tingling or weakness in arms and fingers Denies dizziness or presyncopal episodes Denies lower extremity edema     HISTORY & PERTINENT PRIOR DATA:  Prior History reviewed and updated per electronic medical record.  Significant history, findings, studies and interim changes include:  reports that she quit smoking about 3 months ago. Her smoking use included cigarettes. She has a 5.00 pack-year smoking history. she has never used smokeless tobacco. Recent Labs    12/21/16 1535 04/22/17 1123  HGBA1C 9.2* 10.3   AICD is not  Pacemaker compatable No problems updated.  OBJECTIVE:  VS:  HT:5\' 6"  (167.6 cm)   WT:261 lb 9.6 oz (118.7 kg)  BMI:42.24    BP:120/82  HR:88bpm  TEMP: ( )  RESP:95 %   HISTORY & PERTINENT PRIOR DATA:  Prior History reviewed and updated per electronic medical record.  Significant history, findings, studies and interim changes include:  reports that she quit smoking about 3 months ago. Her smoking use included cigarettes. She has a 5.00 pack-year smoking history. she has never used smokeless tobacco. Recent Labs    12/21/16 1535 04/22/17 1123  HGBA1C 9.2* 10.3   AICD is not Pacemaker compatable No problems updated.   OBJECTIVE:  VS:  HT:5\' 6"  (167.6 cm)   WT:261 lb 9.6 oz (118.7 kg)  BMI:42.24    BP:120/82  HR:88bpm  TEMP: ( )  RESP:95 %  PHYSICAL EXAM: Constitutional: WDWN, Non-toxic appearing. Psychiatric: Alert & appropriately interactive. Not depressed or anxious appearing. Respiratory: No increased work of breathing. Trachea Midline Eyes: Pupils are equal. EOM intact without nystagmus. No scleral icterus Cardiovascular:  Peripheral Pulses: peripheral pulses symmetrical No clubbing or cyanosis appreciated Capillary Refill is normal, less than 2 seconds No signficant generalized edema/anasarca Sensory Exam: intact to light touch  Back: Diffuse muscle spasms throughout the entire lumbar spine most focally on the right compared to the left.  Pain with extension and flexion.  No radicular symptoms.  Tightness with straight leg raise but no radicular component.  Generalized lower extremity weakness at 4 out of 5 diffusely but symmetric.   No additional findings.  ASSESSMENT & PLAN:   1. AICD (automatic cardioverter/defibrillator) present   2. Right-sided low back pain without sciatica, unspecified chronicity   3. Type 2 diabetes mellitus without complication, without long-term current use of insulin (HCC)   4. Chronic systolic congestive heart failure (HCC)   5.  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  She has worsening muscle spasms.  Given other medical issues will have to avoid NSAIDs and steroids.  Trial of tramadol and increased baclofen.  Follow-up as scheduled.  Continue with PT.  If any lack of improvement could consider referral for facet injections.  She is not a candidate for MRI due to her AICD.   ++++++++++++++++++++++++++++++++++++++++++++ Orders & Meds: No orders of the defined types were placed in this encounter.   Meds ordered this encounter  Medications  . DISCONTD: traMADol (ULTRAM) 50 MG tablet    Sig: Take 1 tablet (50 mg total) by mouth every 6 (six) hours as needed for moderate pain.    Dispense:  30 tablet    Refill:  0  . baclofen (LIORESAL) 10 MG tablet    Sig: Take 1 tablet (10 mg total) by mouth 3 (three) times daily.    Dispense:  90 tablet    Refill:  1  . traMADol (ULTRAM) 50 MG tablet    Sig: Take 1 tablet (50 mg total) by mouth every 6 (six) hours as needed for moderate pain.    Dispense:  30 tablet    Refill:  0    ++++++++++++++++++++++++++++++++++++++++++++ Follow-up: Return as scheduled.   Pertinent documentation may be included in additional procedure notes, imaging studies, problem based documentation and patient instructions. Please see these sections of the encounter for additional information regarding this visit. CMA/ATC served as Neurosurgeonscribe during this visit. History, Physical, and Plan performed by medical provider. Documentation and orders reviewed and attested to.      Andrena MewsMichael D Rigby, DO    [facility] Phys[facility]
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
## 2017-07-21 ENCOUNTER — Telehealth: Payer: Self-pay | Admitting: Family Medicine

## 2017-07-21 DIAGNOSIS — E119 Type 2 diabetes mellitus without complications: Secondary | ICD-10-CM

## 2017-07-21 NOTE — Telephone Encounter (Signed)
Copied from CRM [id number]. Topic: Quick Communication - Rx Refill/Question >> Jul 21, 2017 10:59 AM Anice Paganini I, NT wrote: Has the patient contacted their pharmacy yes   (Agent: If no, request that the patient contact the pharmacy for the refill Coreg 12.5 Mg and Metformin 500 Mg    Preferred Pharmacy (with phone number or street name [facility] [address] [phone]   Agent: Please be advised that RX refills may take up to 3 business days. We ask that you follow-up with your pharmacy.

## 2017-07-21 NOTE — Telephone Encounter (Signed)
Please advise on refills.  

## 2017-07-21 NOTE — Telephone Encounter (Signed)
See note

## 2017-07-22 ENCOUNTER — Other Ambulatory Visit: Payer: Self-pay | Admitting: Surgical

## 2017-07-22 ENCOUNTER — Ambulatory Visit (INDEPENDENT_AMBULATORY_CARE_PROVIDER_SITE_OTHER): Payer: Managed Care, Other (non HMO) | Admitting: Physical Therapy

## 2017-07-22 ENCOUNTER — Encounter: Payer: Self-pay | Admitting: Physical Therapy

## 2017-07-22 DIAGNOSIS — M25561 Pain in right knee: Secondary | ICD-10-CM

## 2017-07-22 DIAGNOSIS — M545 Low back pain, unspecified: Secondary | ICD-10-CM

## 2017-07-22 DIAGNOSIS — M25661 Stiffness of right knee, not elsewhere classified: Secondary | ICD-10-CM

## 2017-07-22 DIAGNOSIS — E119 Type 2 diabetes mellitus without complications: Secondary | ICD-10-CM

## 2017-07-22 MED ORDER — CARVEDILOL 12.5 MG PO TABS: 12.5000 mg | ORAL_TABLET | Freq: Two times a day (BID) | ORAL | 1 refills | Status: DC

## 2017-07-22 MED ORDER — METFORMIN HCL 500 MG PO TABS: 500.0000 mg | ORAL_TABLET | Freq: Two times a day (BID) | ORAL | 1 refills | Status: DC

## 2017-07-22 MED ORDER — METFORMIN HCL 500 MG PO TABS: 500.0000 mg | ORAL_TABLET | Freq: Two times a day (BID) | ORAL | 1 refills | Status: AC

## 2017-07-22 NOTE — Telephone Encounter (Signed)
Okay refills.  
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
## 2017-07-22 NOTE — Telephone Encounter (Signed)
Prescription sent to the pharmacy.
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
## 2017-07-28 ENCOUNTER — Ambulatory Visit: Payer: Managed Care, Other (non HMO) | Admitting: Nurse Practitioner

## 2017-07-28 ENCOUNTER — Ambulatory Visit (HOSPITAL_COMMUNITY): Payer: Managed Care, Other (non HMO) | Attending: Internal Medicine

## 2017-07-28 ENCOUNTER — Encounter: Payer: Self-pay | Admitting: Physician Assistant

## 2017-07-28 ENCOUNTER — Other Ambulatory Visit: Payer: Self-pay

## 2017-07-28 ENCOUNTER — Ambulatory Visit: Payer: Managed Care, Other (non HMO) | Admitting: Physician Assistant

## 2017-07-28 VITALS — BP 128/76 | HR 86 | Ht 66.0 in | Wt 263.0 lb

## 2017-07-28 VITALS — BP 134/74 | HR 96 | Ht 60.0 in | Wt 262.0 lb

## 2017-07-28 DIAGNOSIS — I509 Heart failure, unspecified: Secondary | ICD-10-CM | POA: Insufficient documentation

## 2017-07-28 DIAGNOSIS — R140 Abdominal distension (gaseous): Secondary | ICD-10-CM

## 2017-07-28 DIAGNOSIS — I2510 Atherosclerotic heart disease of native coronary artery without angina pectoris: Secondary | ICD-10-CM | POA: Diagnosis not present

## 2017-07-28 DIAGNOSIS — E119 Type 2 diabetes mellitus without complications: Secondary | ICD-10-CM | POA: Insufficient documentation

## 2017-07-28 DIAGNOSIS — R142 Eructation: Secondary | ICD-10-CM

## 2017-07-28 DIAGNOSIS — I5022 Chronic systolic (congestive) heart failure: Secondary | ICD-10-CM | POA: Diagnosis not present

## 2017-07-28 DIAGNOSIS — Z8249 Family history of ischemic heart disease and other diseases of the circulatory system: Secondary | ICD-10-CM | POA: Diagnosis not present

## 2017-07-28 DIAGNOSIS — Z95810 Presence of automatic (implantable) cardiac defibrillator: Secondary | ICD-10-CM | POA: Diagnosis not present

## 2017-07-28 DIAGNOSIS — K5900 Constipation, unspecified: Secondary | ICD-10-CM | POA: Diagnosis not present

## 2017-07-28 DIAGNOSIS — I493 Ventricular premature depolarization: Secondary | ICD-10-CM

## 2017-07-28 DIAGNOSIS — I110 Hypertensive heart disease with heart failure: Secondary | ICD-10-CM | POA: Diagnosis not present

## 2017-07-28 DIAGNOSIS — Z87891 Personal history of nicotine dependence: Secondary | ICD-10-CM | POA: Insufficient documentation

## 2017-07-28 DIAGNOSIS — R110 Nausea: Secondary | ICD-10-CM

## 2017-07-28 DIAGNOSIS — I420 Dilated cardiomyopathy: Secondary | ICD-10-CM | POA: Insufficient documentation

## 2017-07-28 DIAGNOSIS — R6881 Early satiety: Secondary | ICD-10-CM

## 2017-07-28 DIAGNOSIS — I10 Essential (primary) hypertension: Secondary | ICD-10-CM | POA: Diagnosis not present

## 2017-07-28 LAB — ECHOCARDIOGRAM COMPLETE
Height: 66 in
Weight: 4208 oz

## 2017-07-28 NOTE — Progress Notes (Addendum)
Subjective:    Patient ID: Kelly [name], female    DOB: 10-02-1985, 32 y.o.   MRN: [id number]  HPI  Kelly "Pumpkin" is a pleasant 32 year old African-American female, new to GI today referred by Dr. Helane Rima D.O. for evaluation of abdominal pain and intermittent vomiting. 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 She has a defibrillator. She has no prior history of GI issues. She says her current symptoms started in November 2018 initially noticing that she had developed constipation. She says this became associated with nausea and belching bloating and gas and she had about 4 episodes of vomiting in November. She says when she was sick she would be sick for a couple of days, symptoms would improve and then recur. Now she seems to be having an episode about once a week with nausea and vomiting but has had ongoing problems with bloating and what she describes as "sulfur" belches and persistent constipation. She usually has always had one or 2 bowel movements per day and is now having a bowel movement about every 3 days and does not feel like she's evacuating her bowels. There is no melena or hematochezia. She denies any heartburn or indigestion but says she does seem to fill up quickly. She has been diabetic for about 10 years, maintained on Trulicity,Farxiga and Glucophage. She says her sugars have not been under good control and a running close to 300. Is not taking any regular aspirin or NSAIDs. She has a prescription for baclofen which she uses occasionally and also does not take her tramadol regularly KUB was done on 07/05/2017 showing a large stool burden. Most recent labs done 07/05/2017 LFTs normal with the exception of an ALT at 46, glucose 283 and CBC within normal limits  Review of Systems Pertinent positive and negative review of systems were noted in the above  HPI section.  All other review of systems was otherwise negative.  Outpatient Encounter Medications as of 07/28/2017  Medication Sig  . acetaminophen (TYLENOL) 500 MG tablet Take 1,000 mg by mouth every 6 (six) hours as needed for moderate pain or headache.  . baclofen (LIORESAL) 10 MG tablet Take 1 tablet (10 mg total) by mouth 3 (three) times daily.  . carvedilol (COREG) 12.5 MG tablet Take 1 tablet (12.5 mg total) by mouth 2 (two) times daily with a meal.  . dapagliflozin propanediol (FARXIGA) 5 MG TABS tablet Take 5 mg by mouth daily.  . Dulaglutide (TRULICITY) 1.5 MG/0.5ML SOPN Inject 1 pen into the skin once a week.  Marland Kitchen eplerenone (INSPRA) 25 MG tablet Take 1 tablet (25 mg total) by mouth daily.  . Magnesium Oxide 200 MG TABS Take 1 tablet (200 mg total) by mouth 2 (two) times daily.  . metFORMIN (GLUCOPHAGE) 500 MG tablet Take 1 tablet (500 mg total) by mouth 2 (two) times daily with a meal.  . potassium chloride SA (K-DUR,KLOR-CON) 20 MEQ tablet Take 20-40 mEq by mouth See admin instructions. Take 20 MEQ by mouth in the morning and 40 MEQ in the evening  . traMADol (ULTRAM) 50 MG tablet Take 1 tablet (50 mg total) by mouth every 6 (six) hours as needed for moderate pain.   No facility-administered encounter medications on file as of 07/28/2017.    Allergies  Allergen Reactions  . Aldactone [Spironolactone] Shortness Of Breath  . Latex Rash   Patient Active Problem List   Diagnosis  Date Noted  . Right-sided low back pain without sciatica 06/24/2017  . Primary osteoarthritis of right knee 05/26/2017  . PVC's (premature ventricular contractions) 04/06/2017  . Excessive daytime sleepiness 03/21/2017  . Congestive heart failure (HCC) 12/21/2016  . Memory difficulty 12/21/2016  . BMI 40.0-44.9, adult (HCC) 03/29/2015  . PCOS (polycystic ovarian syndrome) 03/29/2015  . Nicotine dependence in remission 03/29/2015  . Dry eyes 02/15/2014  . AICD (automatic cardioverter/defibrillator)  present 06/17/2013  . Cardiomyopathy, dilated, nonischemic (HCC) 06/17/2013  . Hypertension 12/21/2011  . Diabetes mellitus (HCC) 05/13/2010   Social History   Socioeconomic History  . Marital status: Divorced    Spouse name: Not on file  . Number of children: Not on file  . Years of education: Not on file  . Highest education level: Not on file  Social Needs  . Financial resource strain: Not on file  . Food insecurity - worry: Not on file  . Food insecurity - inability: Not on file  . Transportation needs - medical: Not on file  . Transportation needs - non-medical: Not on file  Occupational History    Employer: [employer]  Tobacco Use  . Smoking status: Former Smoker    Packs/day: 0.50    Years: 10.00    Pack years: 5.00    Types: Cigarettes    Last attempt to quit: 04/06/2017    Years since quitting: 0.3  . Smokeless tobacco: Never Used  Substance and Sexual Activity  . Alcohol use: Yes    Comment: 04/06/2017 "might have a couple drinks q couple weeks"  . Drug use: Yes    Types: Marijuana    Comment: 04/06/2017 "maybe monthly"  . Sexual activity: Not Currently    Birth control/protection: Condom  Other Topics Concern  . Not on file  Social History Narrative  . Not on file    [name]'s family history includes Diabetes in her maternal grandmother; Heart disease in her maternal grandmother; Hyperlipidemia in her maternal grandmother; Hypertension in her maternal grandmother; Mental illness in her brother.      Objective:    Vitals:   07/28/17 0838  BP: 128/76  Pulse: 86    Physical Exam ; well-developed African-American female in no acute distress, pleasant blood pressure 128/76 pulse 86, height 5 foot 6, weight 263, BMI 42.4. HEENT; nontraumatic normocephalic EOMI PERRLA sclera anicteric, Cardiovascular; regular rate and rhythm with S1-S2 soft murmur murmur There is a defibrillator in the left chest wall, Pulmonary; clear bilaterally, Abdomen  ;soft, she has some mild tenderness in the epigastrium and left upper quadrant no guarding or rebound no palpable mass or hepatosplenomegaly bowel sounds are present, Rectal ;exam not done, Extremities; no clubbing cyanosis or edema skin warm and dry, Neuropsych; mood and affect appropriate       Assessment & Plan:   #67 32 year old African-American female with 2 month history of abdominal bloating belching nausea and intermittent vomiting and constipation. Etiology of her symptoms is not entirely clear, I think constipation is definitely playing a role, also wonder if she may have some diabetic visceral neuropathy and/or diabetic gastroparesis accounting for upper GI symptoms. Rule out gallbladder disease  #2 adult-onset diabetes mellitus traverse poorly controlled #3 congestive heart failure and dilated cardiomyopathy with EF of 25-30% #4 status post defibrillator #5 PCOS  Plan; schedule for upper abdominal ultrasound Schedule for gastric emptying scan next line start step 3 gastroparesis diet Patient was instructed to take a bowel prep, and was given a sample of  MoviPrep to purge bowels. After purge she will start MiraLAX 17 g in 8 ounces of water on a daily basis She will follow up in the office in 3-4 weeks with me. Would consider EGD with Dr. Rhea Belton if above studies are negative and symptoms are persisting, though would hope to avoid with her significant underlying cardiomyopathy.. We also discussed the importance of tight  glucose control and its association with upper GI symptoms/gastroparesis.   Amy S Esterwood PA-C 07/28/2017   Cc: Helane Rima, DO   Addendum: Reviewed and agree with initial management. Pyrtle, Carie Caddy, MD

## 2017-07-28 NOTE — Patient Instructions (Addendum)
Medication Instructions:   Your physician recommends that you continue on your current medications as directed. Please refer to the Current Medication list given to you today.   If you need a refill on your cardiac medications before your next appointment, please call your pharmacy.  Labwork:  NONE ORDERED  TODAY      Testing/Procedures: NONE ORDERED  TODAY    Follow-Up:  Your physician wants you to follow-up in: ONE YEAR WITH ALLRED   You will receive a reminder letter in the mail two months in advance. If you don't receive a letter, please call our office to schedule the follow-up appointment.    Remote monitoring is used to monitor your Pacemaker of ICD from home. This monitoring reduces the number of office visits required to check your device to one time per year. It allows [place] to keep an eye on the functioning of your device to ensure it is working properly. You are scheduled for a device check from home on .  10-27-17 You may send your transmission at any time that day. If you have a wireless device, the transmission will be sent automatically. After your physician reviews your transmission, you will receive a postcard with your next transmission date.     Any Other Special Instructions Will Be Listed Below (If Applicable).

## 2017-07-28 NOTE — Patient Instructions (Addendum)
USe the Moviprep for a bowel purge.  You can mix the packets with Gatorade or, water, or Crystal light lemonade.  There are two doses in the box.   Then start Miralax 17 grams of powder in 8 oz of water daily.  Follow step 3 of the Gastroparesis diet handout we have provided you.  You have been scheduled for an abdominal ultrasound at [facility] (1st floor of hospital) on Tues 1-15 at 8:00 am. Please arrive 15 minutes prior to your appointment for registration. Make certain not to have anything to eat or drink after midnight to your appointment. Should you need to reschedule your appointment, please contact radiology at [phone]. This test typically takes about 30 minutes to perform.  You have been scheduled for a gastric emptying scan at [facility] on Tuesday 1-22 at 7:30 am. Please arrive at least 15 minutes prior to your appointment for registration. Please make certain not to have anything to eat or drink after midnight the night before your test. Hold all stomach medications (ex: Zofran, phenergan, Reglan) 48 hours prior to your test. If you need to reschedule your appointment, please contact radiology scheduling at [phone]. _____________________________________________________________________ A gastric-emptying study measures how long it takes for food to move through your stomach. There are several ways to measure stomach emptying. In the most common test, you eat food that contains a small amount of radioactive material. A scanner that detects the movement of the radioactive material is placed over your abdomen to monitor the rate at which food leaves your stomach. This test normally takes about 4 hours to complete. _____________________________________________________________________ We scheduled you with Amy for an appointment on 08-12-2017 at 8:30 am.  IIf you are age 90 or younger, your body mass index should be between 19-25. Your Body mass index is 42.45  kg/m. If this is out of the aformentioned range listed, please consider follow up with your Primary Care Provider.

## 2017-08-02 ENCOUNTER — Ambulatory Visit (INDEPENDENT_AMBULATORY_CARE_PROVIDER_SITE_OTHER): Payer: Managed Care, Other (non HMO) | Admitting: Physical Therapy

## 2017-08-02 ENCOUNTER — Telehealth: Payer: Self-pay | Admitting: Family Medicine

## 2017-08-02 DIAGNOSIS — M25561 Pain in right knee: Secondary | ICD-10-CM

## 2017-08-02 DIAGNOSIS — M25661 Stiffness of right knee, not elsewhere classified: Secondary | ICD-10-CM

## 2017-08-02 DIAGNOSIS — M545 Low back pain, unspecified: Secondary | ICD-10-CM

## 2017-08-02 NOTE — Telephone Encounter (Signed)
Patient's copay information updated in appointment notes. $0 Copay, 20% Co-Ins and 60 visits per year.
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
## 2017-08-02 NOTE — Telephone Encounter (Signed)
Patient would like to know how much her copay will be with her physical therapy treatments. I see her $0 copay, however it was in the notes of the referral which was placed right before the patient switched insurance companies. Please advise.

## 2017-08-02 NOTE — Telephone Encounter (Signed)
Patient has been notified

## 2017-08-03 ENCOUNTER — Ambulatory Visit (HOSPITAL_COMMUNITY)
Admission: RE | Admit: 2017-08-03 | Discharge: 2017-08-03 | Disposition: A | Discharge status: Home / Self Care | Payer: Managed Care, Other (non HMO) | Source: Ambulatory Visit | Attending: Physician Assistant | Admitting: Physician Assistant

## 2017-08-03 DIAGNOSIS — K769 Liver disease, unspecified: Secondary | ICD-10-CM | POA: Diagnosis not present

## 2017-08-03 DIAGNOSIS — R110 Nausea: Secondary | ICD-10-CM | POA: Diagnosis present

## 2017-08-03 DIAGNOSIS — K5900 Constipation, unspecified: Secondary | ICD-10-CM

## 2017-08-03 DIAGNOSIS — K829 Disease of gallbladder, unspecified: Secondary | ICD-10-CM | POA: Insufficient documentation

## 2017-08-03 DIAGNOSIS — R6881 Early satiety: Secondary | ICD-10-CM

## 2017-08-03 DIAGNOSIS — R142 Eructation: Secondary | ICD-10-CM

## 2017-08-03 DIAGNOSIS — R140 Abdominal distension (gaseous): Secondary | ICD-10-CM | POA: Diagnosis present

## 2017-08-05 ENCOUNTER — Ambulatory Visit: Payer: Self-pay | Admitting: Sports Medicine

## 2017-08-05 ENCOUNTER — Ambulatory Visit: Payer: PRIVATE HEALTH INSURANCE | Admitting: Family Medicine

## 2017-08-06 ENCOUNTER — Ambulatory Visit: Payer: Self-pay

## 2017-08-06 ENCOUNTER — Encounter: Payer: Self-pay | Admitting: Sports Medicine

## 2017-08-06 ENCOUNTER — Encounter: Payer: Self-pay | Admitting: Physical Therapy

## 2017-08-06 ENCOUNTER — Ambulatory Visit: Payer: Managed Care, Other (non HMO) | Admitting: Sports Medicine

## 2017-08-06 ENCOUNTER — Ambulatory Visit (INDEPENDENT_AMBULATORY_CARE_PROVIDER_SITE_OTHER): Payer: Managed Care, Other (non HMO) | Admitting: Physical Therapy

## 2017-08-06 VITALS — BP 120/84 | HR 100 | Ht 66.0 in | Wt 259.2 lb

## 2017-08-06 DIAGNOSIS — M25661 Stiffness of right knee, not elsewhere classified: Secondary | ICD-10-CM

## 2017-08-06 DIAGNOSIS — G8929 Other chronic pain: Secondary | ICD-10-CM | POA: Diagnosis not present

## 2017-08-06 DIAGNOSIS — M545 Low back pain, unspecified: Secondary | ICD-10-CM

## 2017-08-06 DIAGNOSIS — M1711 Unilateral primary osteoarthritis, right knee: Secondary | ICD-10-CM

## 2017-08-06 DIAGNOSIS — Z95810 Presence of automatic (implantable) cardiac defibrillator: Secondary | ICD-10-CM | POA: Diagnosis not present

## 2017-08-06 DIAGNOSIS — M25561 Pain in right knee: Secondary | ICD-10-CM

## 2017-08-06 NOTE — Progress Notes (Signed)
Kelly [name]. [name] at [facility] [phone]  DAIJANAE ANGELOPOULOS - 32 y.o. female MRN [id number]  Date of birth: [birth year]  Visit Date: 08/06/2017  PCP: Helane Rima, DO   Referred by: Helane Rima, DO   Scribe for today's visit: Stevenson Clinch, CMA     SUBJECTIVE:  Kelly [name] "Pumpkin" is here for Follow-up (RT-sided low back pain)  Compared to the last office visit, her previously described symptoms show no change , some days are better and some days are worse.  Current symptoms are mild & are nonradiating She has been taking Baclofen and Tylenol as needed with some relief. Baclofen was increased at last visit from BID to TID. She was prescribed Tramadol at her last visit and has been taking that with some relief. She has also been using Biofreeze prn with some relief.  She is currently in PT and had dry needling done today. She has been doing home exercises with no trouble.   She continues to have trouble with the right knee. At times she feels like the knee will give out on her. Knee pain normally lasts for a day or two and then resolves. Pain seems to be worse after working. She is working on strengthening the muscles around the knee. She has noticed some swelling around the knee but not as bad as is was before.    ROS Denies night time disturbances. Denies fevers, chills, or night sweats. Denies unexplained weight loss. Denies personal history of cancer. Denies changes in bowel or bladder habits. Denies recent unreported falls. Denies new or worsening dyspnea or wheezing. Denies headaches or dizziness.  Denies numbness, tingling or weakness  In the extremities.  Denies dizziness or presyncopal episodes Denies lower extremity edema     HISTORY & PERTINENT PRIOR DATA:  Prior History reviewed and updated per electronic medical record.  Significant history, findings, studies and interim changes include:  reports that she quit smoking about 4 months ago. Her smoking use included cigarettes. She has a 5.00 pack-year smoking history. she has never used smokeless tobacco. Recent Labs    12/21/16 1535 04/22/17 1123  HGBA1C 9.2* 10.3   AICD is not Pacemaker compatable  ** Pt NS on Moday. She may bring knee brace for you to look at. Was hoping she could get something to pull on and wear to work for compression, to help with swelling during the day. We tried Large Body Helix, did not fit well, but discussed getting something OTC.  Problem  Chronic Right-Sided Low Back Pain Without Sciatica   X-rays on 06/24/2017 show very mild degenerative disc disease at L1 and L2.  Otherwise well aligned.  No significant degenerative changes     OBJECTIVE:  VS:  HT:5\' 6"  (167.6 cm)   WT:259 lb 3.2 oz (117.6 kg)  BMI:41.86    BP:120/84  HR:100bpm  TEMP: ( )  RESP:100 %   PHYSICAL EXAM: Constitutional: WDWN, Non-toxic appearing. Psychiatric: Alert & appropriately interactive.  Not depressed or anxious appearing. Respiratory: No increased work of breathing.  Trachea Midline Eyes: Pupils are equal.  EOM intact without nystagmus.  No scleral icterus  NEUROVASCULAR exam: No clubbing or cyanosis appreciated No significant venous stasis changes Capillary Refill: normal, less than 2 seconds    LOWER Extremities  SWELLING Pre-tibial edema: No significant pretibial edema  PULSES Pedal Pulses: Normal & symmetrically palpable  SENSORY Dermatomes intact to light touch  MOTOR Normal strength in all  myotomes  REFLEXES Reflexes: Normal & symmetric DTRs   Bilateral negative straight leg raises.  She has no significant radicular symptoms today. Right knee is overall well aligned but does have a small effusion with generalized synovitis generalized medial and lateral joint line pain that is improved compared in the past.  Overall range of motion is from 0degrees to 110 degrees.  No additional findings.    ASSESSMENT & PLAN:   1. Primary osteoarthritis of right knee   2. Chronic right-sided low back pain without sciatica   3. AICD (automatic cardioverter/defibrillator) present    PLAN: Injection today of the knee.  She is overall doing better from her back and leg pain and his tolerated increased work duties without significant difficulty.  Hopefully the injection again today into her knee will be significantly beneficial and continue to give her longer lasting relief.  Otherwise can consider Visco supplementation in the future.  She should continue with physical therapy and therapeutic exercises.  Follow-up in 10 weeks to ensure clinical resolution and consider repeat injection at that time.  No problem-specific Assessment & Plan notes found for this encounter.   ++++++++++++++++++++++++++++++++++++++++++++ Orders & Meds: Orders Placed This Encounter  Procedures  . US GUIDED NEEDLE PLACEMENT(NO LINKED CHARGES)    No orders of the defined types were placed in this encounter.   ++++++++++++++++++++++++++++++++++++++++++++ Follow-up: Return in about 10 weeks (around 10/15/2017).   Pertinent documentation may be included in additional procedure notes, imaging studies, problem based documentation and patient instructions. Please see these sections of the encounter for additional information regarding this visit. CMA/ATC served as Neurosurgeon during this visit. History, Physical, and Plan performed by medical provider. Documentation and orders reviewed and attested to.      Andrena Mews, DO    [facility]

## 2017-08-06 NOTE — Patient Instructions (Signed)
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
## 2017-08-06 NOTE — Procedures (Signed)
PROCEDURE NOTE:  Ultrasound Guided: Injection: Right knee Images were obtained and interpreted by myself, Gaspar Bidding, DO  Images have been saved and stored to PACS system. Images obtained on: GE S7 Ultrasound machine  ULTRASOUND FINDINGS:  Generalized synovitis, moderate effusion, non-tense  DESCRIPTION OF PROCEDURE:  The patient's clinical condition is marked by substantial pain and/or significant functional disability. Other conservative therapy has not provided relief, is contraindicated, or not appropriate. There is a reasonable likelihood that injection will significantly improve the patient's pain and/or functional impairment.  After discussing the risks, benefits and expected outcomes of the injection and all questions were reviewed and answered, the patient wished to undergo the above named procedure. Verbal consent was obtained.  The ultrasound was used to identify the target structure and adjacent neurovascular structures. The skin was then prepped in sterile fashion and the target structure was injected under direct visualization using sterile technique as below:  PREP: Alcohol, Ethel Chloride APPROACH: superiolateral, single injection, 25g 1.5in. INJECTATE: 2cc 0.5% marcaine, 2cc 40mg /mL DepoMedrol   ASPIRATE: None  Deferred at pt request DRESSING: Band-Aid   Post procedural instructions including recommending icing and warning signs for infection were reviewed.  This procedure was well tolerated and there were no complications.   IMPRESSION: Succesful Ultrasound Guided: Injection

## 2017-08-09 ENCOUNTER — Ambulatory Visit: Payer: Managed Care, Other (non HMO) | Admitting: Family Medicine

## 2017-08-09 DIAGNOSIS — Z0289 Encounter for other administrative examinations: Secondary | ICD-10-CM

## 2017-08-10 ENCOUNTER — Ambulatory Visit (HOSPITAL_COMMUNITY): Payer: Managed Care, Other (non HMO)

## 2017-08-11 ENCOUNTER — Encounter: Payer: PRIVATE HEALTH INSURANCE | Admitting: Nurse Practitioner

## 2017-08-11 LAB — CUP PACEART INCLINIC DEVICE CHECK
Date Time Interrogation Session: 20190123084038
Implantable Lead Implant Date: 20090114
Implantable Lead Location: 753859
Implantable Lead Location: 753860
Implantable Pulse Generator Implant Date: 20180215
MDC IDC LEAD IMPLANT DT: 20090114
Pulse Gen Serial Number: 7337343

## 2017-08-12 ENCOUNTER — Other Ambulatory Visit: Payer: Self-pay | Admitting: *Deleted

## 2017-08-12 ENCOUNTER — Ambulatory Visit: Payer: Managed Care, Other (non HMO) | Admitting: Physician Assistant

## 2017-08-20 ENCOUNTER — Encounter: Payer: Self-pay | Admitting: Family Medicine

## 2017-10-05 ENCOUNTER — Ambulatory Visit: Payer: Self-pay

## 2017-10-05 ENCOUNTER — Ambulatory Visit: Payer: Self-pay | Admitting: Sports Medicine

## 2017-10-05 ENCOUNTER — Encounter: Payer: Self-pay | Admitting: Sports Medicine

## 2017-10-05 VITALS — BP 116/82 | HR 90 | Ht 66.0 in | Wt 247.2 lb

## 2017-10-05 DIAGNOSIS — M1711 Unilateral primary osteoarthritis, right knee: Secondary | ICD-10-CM

## 2017-10-05 DIAGNOSIS — M25561 Pain in right knee: Secondary | ICD-10-CM

## 2017-10-05 DIAGNOSIS — Z95810 Presence of automatic (implantable) cardiac defibrillator: Secondary | ICD-10-CM

## 2017-10-05 DIAGNOSIS — G8929 Other chronic pain: Secondary | ICD-10-CM

## 2017-10-05 DIAGNOSIS — M25461 Effusion, right knee: Secondary | ICD-10-CM

## 2017-10-05 NOTE — Patient Instructions (Signed)

## 2017-10-05 NOTE — Progress Notes (Signed)
[name] - 32 y.o. female MRN [id number]  Date of birth: 12-Dec-1985  Scribe for today's visit: Stevenson Clinch, CMA     SUBJECTIVE:  Kelly [name] "Pumpkin" is here for No chief complaint on file.  08/06/17: Compared to the last office visit, her previously described symptoms show no change , some days are better and some days are worse.  Current symptoms are mild & are nonradiating She has been taking Baclofen and Tylenol as needed with some relief. Baclofen was increased at last visit from BID to TID. She was prescribed Tramadol at her last visit and has been taking that with some relief. She has also been using Biofreeze prn with some relief.  She is currently in PT and had dry needling done today. She has been doing home exercises with no trouble.  She continues to have trouble with the right knee. At times she feels like the knee will give out on her. Knee pain normally lasts for a day or two and then resolves. Pain seems to be worse after working. She is working on strengthening the muscles around the knee. She has noticed some swelling around the knee but not as bad as is was before.   10/05/17: Compared to the last office visit, her previously described symptoms are worsening, started back about 3-4 weeks ago. She has a palpable knot on the top her knee. Her knee has been bucking on her recently, 4-5 times last night. She denies current LBP. At times she has pain in her hips, L>R.  Current symptoms are severe & are radiating to calf and thigh.  She has been wearing Body Helix compression sleeve, knee brace. She has tried taking Tramadol, Baclofen, and Tylenol with minimal relief. She has tried using BioFreeze and Voltaren with minimal relief. She has also tried doing home exercises and got little relief.    ROS Reports night time disturbances. Denies fevers, chills, or night sweats. Denies unexplained weight loss. Denies personal history of cancer. Denies changes in  bowel or bladder habits. Denies recent unreported falls. Denies new or worsening dyspnea or wheezing. Denies headaches or dizziness.  Reports numbness, tingling or weakness  In the extremities.  Denies dizziness or presyncopal episodes Reports lower extremity edema    HISTORY & PERTINENT PRIOR DATA:  Prior History reviewed and updated per electronic medical record.  Significant/pertinent history, findings, studies include:  reports that she quit smoking about 7 months ago. Her smoking use included cigarettes. She has a 5.00 pack-year smoking history. She has never used smokeless tobacco. Recent Labs    12/21/16 1535 04/22/17 1123  HGBA1C 9.2* 10.3   AICD is not Pacemaker compatable  ** Pt NS on Monday. She may bring knee brace for you to look at. Was hoping she could get something to pull on and wear to work for compression, to help with swelling during the day. We tried Large Body Helix, did not fit well, but discussed getting something OTC.   11/11/17 - unable to determine zilretta coverage d/t insurance lapse in coverage. - BSC No problems updated.  OBJECTIVE:  VS:  HT:5\' 6"  (167.6 cm)   WT:247 lb 3.2 oz (112.1 kg)  BMI:39.92    BP:116/82  HR:90bpm  TEMP: ( )  RESP:99 %   PHYSICAL EXAM: Constitutional: WDWN, Non-toxic appearing. Psychiatric: Alert & appropriately interactive.  Not depressed or anxious appearing. Respiratory: No increased work of breathing.  Trachea Midline Eyes: Pupils are equal.  EOM intact without nystagmus.  No  scleral icterus  Vascular Exam: warm to touch no edema  lower extremity neuro exam: unremarkable normal strength normal sensation normal reflexes  MSK Exam: Knee has a moderate effusion today.  Pain with McMurray's and a small amount pain with varus and valgus testing.  This is mild.  She has limited flexion extension by approximately 3 degrees of extension and only to 95 degrees of flexion.  Otherwise Ligman distally stable.    ASSESSMENT & PLAN:   1. Primary osteoarthritis of right knee   2. Chronic pain of right knee   3. Effusion of right knee   4. AICD (automatic cardioverter/defibrillator) present     PLAN: Repeat aspiration injection performed today.  Unfortunate is not a candidate for MRI due to her AC.  We will plan to check in with her in 10 weeks can consider long-acting steroid versus Visco supplementation.  Follow-up: Return in about 10 weeks (around 12/14/2017).      Please see additional documentation for Objective, Assessment and Plan sections. Pertinent additional documentation may be included in corresponding procedure notes, imaging studies, problem based documentation and patient instructions. Please see these sections of the encounter for additional information regarding this visit.  CMA/ATC served as Neurosurgeon during this visit. History, Physical, and Plan performed by medical provider. Documentation and orders reviewed and attested to.      Andrena Mews, DO    [facility]

## 2017-10-05 NOTE — Procedures (Signed)
PROCEDURE NOTE:  Ultrasound Guided: Aspiration and Injection: right knee Images were obtained and interpreted by myself, Gaspar Bidding, DO  Images have been saved and stored to PACS system. Images obtained on: GE S7 Ultrasound machine  DESCRIPTION OF PROCEDURE:  The patient's clinical condition is marked by substantial pain and/or significant functional disability. Other conservative therapy has not provided relief, is contraindicated, or not appropriate. There is a reasonable likelihood that injection will significantly improve the patient's pain and/or functional impairment.  After discussing the risks, benefits and expected outcomes of the injection and all questions were reviewed and answered, the patient wished to undergo the above named procedure. Verbal consent was obtained.  The ultrasound was used to identify the target structure and adjacent neurovascular structures. The skin was then prepped in sterile fashion and the target structure was injected under direct visualization using sterile technique as below:  PREP: Alcohol, Ethel Chloride, 5cc 1% lidocaine on  1.5 in. Needle APPROACH: superiolateral, stopcock technique, 18g 1.5in.  INJECTATE: 2cc: 0.5% marcaine, 2cc: 40mg /mL DepoMedrol   ASPIRATE: 25cc serous fluid   DRESSING: Band-Aid   Post procedural instructions including recommending icing and warning signs for infection were reviewed.   This procedure was well tolerated and there were no complications.   IMPRESSION: Succesful Ultrasound Guided: Aspiration and Injection

## 2017-10-15 ENCOUNTER — Ambulatory Visit: Payer: Managed Care, Other (non HMO) | Admitting: Sports Medicine

## 2017-10-27 ENCOUNTER — Telehealth: Payer: Self-pay | Admitting: Cardiology

## 2017-10-27 ENCOUNTER — Encounter: Payer: 59 | Admitting: *Deleted

## 2017-10-27 NOTE — Telephone Encounter (Signed)
Spoke with pt and reminded pt of remote transmission that is due today. Pt verbalized understanding.   

## 2017-11-16 ENCOUNTER — Encounter: Payer: Self-pay | Admitting: Nurse Practitioner

## 2017-12-08 ENCOUNTER — Encounter: Payer: Self-pay | Admitting: Nurse Practitioner

## 2017-12-08 ENCOUNTER — Ambulatory Visit (INDEPENDENT_AMBULATORY_CARE_PROVIDER_SITE_OTHER): Payer: Managed Care, Other (non HMO) | Admitting: *Deleted

## 2017-12-08 ENCOUNTER — Other Ambulatory Visit: Payer: Self-pay

## 2017-12-08 ENCOUNTER — Emergency Department (HOSPITAL_COMMUNITY): Payer: 59

## 2017-12-08 ENCOUNTER — Emergency Department (HOSPITAL_COMMUNITY)
Admission: EM | Admit: 2017-12-08 | Discharge: 2017-12-08 | Disposition: A | Discharge status: Home / Self Care | Payer: 59 | Attending: Emergency Medicine | Admitting: Emergency Medicine

## 2017-12-08 ENCOUNTER — Telehealth: Payer: Self-pay | Admitting: Internal Medicine

## 2017-12-08 ENCOUNTER — Encounter (HOSPITAL_COMMUNITY): Payer: Self-pay

## 2017-12-08 DIAGNOSIS — R0789 Other chest pain: Secondary | ICD-10-CM | POA: Diagnosis not present

## 2017-12-08 DIAGNOSIS — Z5321 Procedure and treatment not carried out due to patient leaving prior to being seen by health care provider: Secondary | ICD-10-CM | POA: Diagnosis not present

## 2017-12-08 DIAGNOSIS — I420 Dilated cardiomyopathy: Secondary | ICD-10-CM

## 2017-12-08 LAB — I-STAT BETA HCG BLOOD, ED (MC, WL, AP ONLY)

## 2017-12-08 LAB — CBC
HCT: 40.1 % (ref 36.0–46.0)
HEMOGLOBIN: 13 g/dL (ref 12.0–15.0)
MCH: 28 pg (ref 26.0–34.0)
MCHC: 32.4 g/dL (ref 30.0–36.0)
MCV: 86.2 fL (ref 78.0–100.0)
PLATELETS: 276 10*3/uL (ref 150–400)
RBC: 4.65 MIL/uL (ref 3.87–5.11)
RDW: 13.1 % (ref 11.5–15.5)
WBC: 8.1 10*3/uL (ref 4.0–10.5)

## 2017-12-08 LAB — BASIC METABOLIC PANEL
ANION GAP: 7 (ref 5–15)
BUN: 5 mg/dL — ABNORMAL LOW (ref 6–20)
CALCIUM: 9.4 mg/dL (ref 8.9–10.3)
CO2: 26 mmol/L (ref 22–32)
CREATININE: 0.64 mg/dL (ref 0.44–1.00)
Chloride: 105 mmol/L (ref 101–111)
GFR calc non Af Amer: >60 mL/min (ref >60)
Glucose, Bld: 266 mg/dL — ABNORMAL HIGH (ref 65–99)
Potassium: 3.9 mmol/L (ref 3.5–5.1)
SODIUM: 138 mmol/L (ref 135–145)

## 2017-12-08 LAB — I-STAT TROPONIN, ED: TROPONIN I, POC: 0.01 ng/mL (ref 0.00–0.08)

## 2017-12-08 NOTE — Telephone Encounter (Signed)
New message   Pt c/o of Chest Pain: STAT if CP now or developed within 24 hours  1. Are you having CP right now? YES  2. Are you experiencing any other symptoms (ex. SOB, nausea, vomiting, sweating)? DIZZINESS  3. How long have you been experiencing CP? 3 DAYS  4. Is your CP continuous or coming and going? COMING AND GOING  5. Have you taken Nitroglycerin? NO ?   BP 124/86 today

## 2017-12-08 NOTE — Telephone Encounter (Signed)
Patient called in requesting an appointment due to chest pain at center of chest to left breast x 3 days. The pain is intermittent and lasts 2-3 hrs at a time. Today, she has had some dizziness standing at her med cart at work and also some blurry vision. Pts primary cardiologist Dr. Gala Romney.  Pt has never seen him. Asked her to send transmission, transferred her to EP.   Per RN in device, core vue is normal.  Pt is in NSR. Instructed patient to go to [facility] ER to have chest pain evaluated. Her boyfriend will drive her.   She will go now.

## 2017-12-08 NOTE — ED Triage Notes (Signed)
Pt endorses left side chest pain with radiation to the back with dizziness, nausea, and shob x 2 days. Denies recent travel or contraceptives. VSS

## 2017-12-08 NOTE — ED Notes (Signed)
Pt called x5 to update vitals no response.

## 2017-12-08 NOTE — ED Notes (Signed)
Pt called to go back to room and did not respond.

## 2017-12-09 ENCOUNTER — Telehealth (HOSPITAL_COMMUNITY): Payer: Self-pay | Admitting: *Deleted

## 2017-12-09 ENCOUNTER — Telehealth: Payer: Self-pay | Admitting: Nurse Practitioner

## 2017-12-09 ENCOUNTER — Encounter: Payer: Self-pay | Admitting: Cardiology

## 2017-12-09 NOTE — Telephone Encounter (Signed)
Appointment request was sent yesterday but patient also called office for c/o chest pain. I spoke with her. She sent device transmission and then she was advised to go to ER for evaluation.

## 2017-12-09 NOTE — Telephone Encounter (Signed)
New Message    Received my chart message from patient requesting an appointment for chest pains. Please contact patient.

## 2017-12-09 NOTE — Progress Notes (Signed)
Remote ICD transmission.   

## 2017-12-09 NOTE — Telephone Encounter (Signed)
Pt called with concerns of CP.  She states it has been happening off/on for a few days along with dizziness and episodes where she feels her heart racing a few minutes and gets SOB.  She states she spoke w/[facility] yesterday and they did a device check and told her that was normal and advised her to go to ER.  She states she went to ER yesterday and was there 4 hours and there was still a long wait so she left.  ER labs were normal.  She states wt has been stable and denies any edema.  sch appt for tomorrow at 9:30.
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
## 2017-12-10 ENCOUNTER — Encounter (HOSPITAL_COMMUNITY): Payer: Self-pay

## 2017-12-10 ENCOUNTER — Ambulatory Visit (HOSPITAL_COMMUNITY)
Admission: RE | Admit: 2017-12-10 | Discharge: 2017-12-10 | Disposition: A | Discharge status: Home / Self Care | Payer: 59 | Source: Ambulatory Visit | Attending: Internal Medicine | Admitting: Internal Medicine

## 2017-12-10 VITALS — BP 138/94 | HR 84 | Wt 233.2 lb

## 2017-12-10 DIAGNOSIS — I5022 Chronic systolic (congestive) heart failure: Secondary | ICD-10-CM | POA: Diagnosis not present

## 2017-12-10 DIAGNOSIS — I110 Hypertensive heart disease with heart failure: Secondary | ICD-10-CM | POA: Insufficient documentation

## 2017-12-10 DIAGNOSIS — R0602 Shortness of breath: Secondary | ICD-10-CM | POA: Insufficient documentation

## 2017-12-10 DIAGNOSIS — F17210 Nicotine dependence, cigarettes, uncomplicated: Secondary | ICD-10-CM | POA: Diagnosis not present

## 2017-12-10 DIAGNOSIS — Z79891 Long term (current) use of opiate analgesic: Secondary | ICD-10-CM | POA: Diagnosis not present

## 2017-12-10 DIAGNOSIS — R002 Palpitations: Secondary | ICD-10-CM | POA: Diagnosis not present

## 2017-12-10 DIAGNOSIS — Z7984 Long term (current) use of oral hypoglycemic drugs: Secondary | ICD-10-CM | POA: Insufficient documentation

## 2017-12-10 DIAGNOSIS — E119 Type 2 diabetes mellitus without complications: Secondary | ICD-10-CM | POA: Diagnosis not present

## 2017-12-10 DIAGNOSIS — Z720 Tobacco use: Secondary | ICD-10-CM

## 2017-12-10 DIAGNOSIS — I420 Dilated cardiomyopathy: Secondary | ICD-10-CM

## 2017-12-10 DIAGNOSIS — I493 Ventricular premature depolarization: Secondary | ICD-10-CM

## 2017-12-10 DIAGNOSIS — I429 Cardiomyopathy, unspecified: Secondary | ICD-10-CM | POA: Diagnosis not present

## 2017-12-10 DIAGNOSIS — I10 Essential (primary) hypertension: Secondary | ICD-10-CM | POA: Diagnosis not present

## 2017-12-10 DIAGNOSIS — Z79899 Other long term (current) drug therapy: Secondary | ICD-10-CM | POA: Insufficient documentation

## 2017-12-10 MED ORDER — LOSARTAN POTASSIUM 25 MG PO TABS: 12.5000 mg | ORAL_TABLET | Freq: Every day | ORAL | 11 refills | Status: DC

## 2017-12-10 NOTE — Patient Instructions (Signed)
START Losartan 12.5 mg (1/2 tablet) once daily.  Follow up 3 months.  ______________________________________________________ [place] Code: 1500  Take all medication as prescribed the day of your appointment. Bring all medications with you to your appointment.  Do the following things EVERYDAY: 1) Weigh yourself in the morning before breakfast. Write it down and keep it in a log. 2) Take your medicines as prescribed 3) Eat low salt foods-Limit salt (sodium) to 2000 mg per day.  4) Stay as active as you can everyday 5) Limit all fluids for the day to less than 2 liters

## 2017-12-12 NOTE — Progress Notes (Signed)
Kelly [name] is a 32 y.o. female is here for follow up.  History of Present Illness:  Kelly [name], CMA acting as scribe for Dr. Helane [name].   HPI: Patient went to ED on 5/22 for chest pains and dizziness. Left without being seen. She was seen at CHF clinic the next day and told to follow up with PCP. She states she is not having chest pain or dizziness at this time but is "exausted". Patient started new job she will start at [employer] tomorrow and will have normal hours and no weekends. She states that the CHF clinic told her it was just due to stress.   She is not having any chest pain, palpitations or edema. She is having a hard time sleeping. She is not able to go to sleep when she gets off work because she is still wired. She has lost around 30lbs due to change in exercise.   Health Maintenance Due  Topic Date Due  . OPHTHALMOLOGY EXAM  09/20/1993   Depression screen PHQ 2/9 04/22/2017  Decreased Interest 0  Down, Depressed, Hopeless 0  PHQ - 2 Score 0   PMHx, SurgHx, SocialHx, FamHx, Medications, and Allergies were reviewed in the Visit Navigator and updated as appropriate.   Patient Active Problem List   Diagnosis Date Noted  . Left hip pain 12/14/2017  . Chronic right-sided low back pain without sciatica 06/24/2017  . Primary osteoarthritis of right knee 05/26/2017  . PVC's (premature ventricular contractions) 04/06/2017  . Excessive daytime sleepiness 03/21/2017  . Congestive heart failure (HCC) 12/21/2016  . Memory difficulty 12/21/2016  . PCOS (polycystic ovarian syndrome) 03/29/2015  . Nicotine dependence in remission 03/29/2015  . Dry eyes 02/15/2014  . AICD (automatic cardioverter/defibrillator) present 06/17/2013  . Cardiomyopathy, dilated, nonischemic (HCC) 06/17/2013  . Hypertension 12/21/2011  . Diabetes mellitus (HCC) 05/13/2010   Social History   Tobacco Use  . Smoking status: Former Smoker    Packs/day: 0.50    Years:  10.00    Pack years: 5.00    Types: Cigarettes    Last attempt to quit: 04/06/2017    Years since quitting: 0.7  . Smokeless tobacco: Never Used  Substance Use Topics  . Alcohol use: Yes    Comment: 04/06/2017 "might have a couple drinks q couple weeks"  . Drug use: Yes    Types: Marijuana    Comment: 04/06/2017 "maybe monthly"   Current Medications and Allergies:   Current Outpatient Medications:  .  acetaminophen (TYLENOL) 500 MG tablet, Take 1,000 mg by mouth every 6 (six) hours as needed for moderate pain or headache., Disp: , Rfl:  .  baclofen (LIORESAL) 10 MG tablet, Take 1 tablet (10 mg total) by mouth 3 (three) times daily., Disp: 90 tablet, Rfl: 1 .  carvedilol (COREG) 12.5 MG tablet, Take 1 tablet (12.5 mg total) by mouth 2 (two) times daily with a meal., Disp: 30 tablet, Rfl: 1 .  dapagliflozin propanediol (FARXIGA) 5 MG TABS tablet, Take 5 mg by mouth daily., Disp: 30 tablet, Rfl: 0 .  eplerenone (INSPRA) 25 MG tablet, Take 1 tablet (25 mg total) by mouth daily., Disp: 30 tablet, Rfl: 6 .  losartan (COZAAR) 25 MG tablet, Take 0.5 tablets (12.5 mg total) by mouth daily., Disp: 15 tablet, Rfl: 11 .  metFORMIN (GLUCOPHAGE) 500 MG tablet, Take 1 tablet (500 mg total) by mouth 2 (two) times daily with a meal., Disp: 60 tablet, Rfl: 1 .  potassium  chloride SA (K-DUR,KLOR-CON) 20 MEQ tablet, Take 20-40 mEq by mouth See admin instructions. Take 20 MEQ by mouth in the morning and 40 MEQ in the evening, Disp: , Rfl:  .  traMADol (ULTRAM) 50 MG tablet, Take 1 tablet (50 mg total) by mouth every 6 (six) hours as needed for moderate pain. (Patient taking differently: Take 50 mg by mouth every 6 (six) hours as needed for moderate pain (1-2 tablets prn). ), Disp: 30 tablet, Rfl: 0   Allergies  Allergen Reactions  . Aldactone [Spironolactone] Shortness Of Breath  . Latex Rash   Review of Systems   Pertinent items are noted in the HPI. Otherwise, ROS is negative.  Vitals:   Vitals:    12/14/17 1528  BP: 120/82  Pulse: 91  Temp: 98.6 F (37 C)  TempSrc: Oral  SpO2: 97%  Weight: 234 lb 9.6 oz (106.4 kg)  Height: 5\' 6"  (1.676 m)     Body mass index is 37.87 kg/m.  Physical Exam:   Physical Exam  Constitutional: She appears well-nourished.  HENT:  Head: Normocephalic and atraumatic.  Eyes: Pupils are equal, round, and reactive to light. EOM are normal.  Neck: Normal range of motion. Neck supple.  Cardiovascular: Normal rate, regular rhythm, normal heart sounds and intact distal pulses.  Pulmonary/Chest: Effort normal.  Abdominal: Soft.  Skin: Skin is warm.  Psychiatric: She has a normal mood and affect. Her behavior is normal.  Nursing note and vitals reviewed.  Results for orders placed or performed in visit on 12/14/17  TSH  Result Value Ref Range   TSH 0.41 0.35 - 4.50 uIU/mL  Hemoglobin A1c  Result Value Ref Range   Hgb A1c MFr Bld 10.6 (H) 4.6 - 6.5 %  Comprehensive metabolic panel  Result Value Ref Range   Sodium 138 135 - 145 mEq/L   Potassium 3.9 3.5 - 5.1 mEq/L   Chloride 102 96 - 112 mEq/L   CO2 25 19 - 32 mEq/L   Glucose, Bld 310 (H) 70 - 99 mg/dL   BUN 8 6 - 23 mg/dL   Creatinine, Ser 1.61 0.40 - 1.20 mg/dL   Total Bilirubin 0.3 0.2 - 1.2 mg/dL   Alkaline Phosphatase 74 39 - 117 U/L   AST 20 0 - 37 U/L   ALT 30 0 - 35 U/L   Total Protein 7.3 6.0 - 8.3 g/dL   Albumin 4.0 3.5 - 5.2 g/dL   Calcium 9.4 8.4 - 09.6 mg/dL   GFR 045.40 >98.11 mL/min    Assessment and Plan:   Kelly [name] was seen today for follow-up.  Diagnoses and all orders for this visit:  Chronic systolic congestive heart failure (HCC)  Type 2 diabetes mellitus without complication, without long-term current use of insulin (HCC) -     Hemoglobin A1c -     Comprehensive metabolic panel  Other fatigue -     TSH  Primary insomnia -     traZODone (DESYREL) 50 MG tablet; Take 0.5-1 tablets (25-50 mg total) by mouth at bedtime as needed for sleep.  OCP (oral  contraceptive pills) initiation -     norgestimate-ethinyl estradiol (SPRINTEC 28) 0.25-35 MG-MCG tablet; Take 1 tablet by mouth daily.  Statin declined    . Reviewed expectations re: course of current medical issues. . Discussed self-management of symptoms. . Outlined signs and symptoms indicating need for more acute intervention. . Patient verbalized understanding and all questions were answered. Marland Kitchen Health Maintenance issues including appropriate healthy diet, exercise,  and smoking avoidance were discussed with patient. . See orders for this visit as documented in the electronic medical record. . Patient received an After Visit Summary.  Kelly Rima, DO [place] 12/19/2017  Future Appointments  Date Time Provider Department Center  03/09/2018 10:30 AM [facility] PA/NP [facility] None  03/09/2018 12:00 PM [facility] DEVICE REMOTES [facility]  03/16/2018  8:00 AM Kelly Rima, DO [facility]    CMA served as scribe during this visit. History, Physical, and Plan performed by medical provider. The above documentation has been reviewed and is accurate and complete. Kelly [name], D.O.

## 2017-12-14 ENCOUNTER — Ambulatory Visit: Payer: 59 | Admitting: Sports Medicine

## 2017-12-14 ENCOUNTER — Ambulatory Visit: Payer: 59 | Admitting: Family Medicine

## 2017-12-14 ENCOUNTER — Encounter: Payer: Self-pay | Admitting: Sports Medicine

## 2017-12-14 VITALS — BP 128/82 | HR 99 | Ht 66.0 in | Wt 232.6 lb

## 2017-12-14 VITALS — BP 120/82 | HR 91 | Temp 98.6°F | Ht 66.0 in | Wt 234.6 lb

## 2017-12-14 DIAGNOSIS — I5022 Chronic systolic (congestive) heart failure: Secondary | ICD-10-CM

## 2017-12-14 DIAGNOSIS — E119 Type 2 diabetes mellitus without complications: Secondary | ICD-10-CM | POA: Diagnosis not present

## 2017-12-14 DIAGNOSIS — M545 Low back pain, unspecified: Secondary | ICD-10-CM

## 2017-12-14 DIAGNOSIS — Z30011 Encounter for initial prescription of contraceptive pills: Secondary | ICD-10-CM | POA: Diagnosis not present

## 2017-12-14 DIAGNOSIS — Z5320 Procedure and treatment not carried out because of patient's decision for unspecified reasons: Secondary | ICD-10-CM

## 2017-12-14 DIAGNOSIS — F5101 Primary insomnia: Secondary | ICD-10-CM | POA: Diagnosis not present

## 2017-12-14 DIAGNOSIS — R5383 Other fatigue: Secondary | ICD-10-CM | POA: Diagnosis not present

## 2017-12-14 DIAGNOSIS — Z95810 Presence of automatic (implantable) cardiac defibrillator: Secondary | ICD-10-CM

## 2017-12-14 DIAGNOSIS — M1711 Unilateral primary osteoarthritis, right knee: Secondary | ICD-10-CM

## 2017-12-14 DIAGNOSIS — M25552 Pain in left hip: Secondary | ICD-10-CM

## 2017-12-14 MED ORDER — NORGESTIMATE-ETH ESTRADIOL 0.25-35 MG-MCG PO TABS: 1.0000 | ORAL_TABLET | Freq: Every day | ORAL | 3 refills | Status: AC

## 2017-12-14 MED ORDER — TRAZODONE HCL 50 MG PO TABS: 25.0000 mg | ORAL_TABLET | Freq: Every evening | ORAL | 3 refills | Status: AC | PRN

## 2017-12-14 MED ORDER — TRAMADOL HCL 50 MG PO TABS: 50.0000 mg | ORAL_TABLET | Freq: Four times a day (QID) | ORAL | 0 refills | Status: AC | PRN

## 2017-12-14 NOTE — Progress Notes (Signed)
Kelly [name]. [name] [phone]  Kelly [name] - 32 y.o. female MRN [id number]  Date of birth: 1986-01-29  Visit Date: 12/14/2017  PCP: Helane Rima, DO   Referred by: Helane Rima, DO  Scribe for today's visit: Stevenson Clinch, CMA     SUBJECTIVE:  Kelly [name] "Pumpkin" is here for Follow-up (osteoarthritis R knee)  08/06/17: Compared to the last office visit, her previously described symptoms show no change , some days are better and some days are worse.  Current symptoms are mild & are nonradiating She has been taking Baclofen and Tylenol as needed with some relief. Baclofen was increased at last visit from BID to TID. She was prescribed Tramadol at her last visit and has been taking that with some relief. She has also been using Biofreeze prn with some relief.  She is currently in PT and had dry needling done today. She has been doing home exercises with no trouble.  She continues to have trouble with the right knee. At times she feels like the knee will give out on her. Knee pain normally lasts for a day or two and then resolves. Pain seems to be worse after working. She is working on strengthening the muscles around the knee. She has noticed some swelling around the knee but not as bad as is was before.   10/05/17: Compared to the last office visit, her previously described symptoms are worsening, started back about 3-4 weeks ago. She has a palpable knot on the top her knee. Her knee has been bucking on her recently, 4-5 times last night. She denies current LBP. At times she has pain in her hips, L>R.  Current symptoms are severe & are radiating to calf and thigh.  She has been wearing Body Helix compression sleeve, knee brace. She has tried taking Tramadol, Baclofen, and Tylenol with minimal relief. She has tried using BioFreeze and Voltaren with minimal relief. She has also tried doing home exercises  and got little relief.   12/14/2017: Compared to the last office visit, her previously described symptoms are improving. Pain is not constant. She does still notice some swelling.  Current symptoms are mild & are radiating to the L leg after intercourse. Radiating pain lasted for about 4 days. This has never happened before.  She has been taking Tramadol and Baclofen as needed.   ROS Denies night time disturbances. Denies fevers, chills, or night sweats. Denies unexplained weight loss. Denies personal history of cancer. Denies changes in bowel or bladder habits. Denies recent unreported falls. Denies new or worsening dyspnea or wheezing. Denies headaches or dizziness.  Reports numbness, tingling or weakness  In the extremities.  Denies dizziness or presyncopal episodes Reports lower extremity edema    HISTORY & PERTINENT PRIOR DATA:  Prior History reviewed and updated per electronic medical record.  Significant/pertinent history, findings, studies include:  reports that she quit smoking about 8 months ago. Her smoking use included cigarettes. She has a 5.00 pack-year smoking history. She has never used smokeless tobacco. Recent Labs    12/21/16 1535 04/22/17 1123 12/14/17 1554  HGBA1C 9.2* 10.3 10.6*   No problems updated.  OBJECTIVE:  VS:  HT:5\' 6"  (167.6 cm)   WT:232 lb 9.6 oz (105.5 kg)  BMI:37.56    BP:128/82  HR:99bpm  TEMP: ( )  RESP:95 %   PHYSICAL EXAM: Constitutional: WDWN, Non-toxic appearing. Psychiatric: Alert & appropriately interactive.  Not depressed or  anxious appearing. Respiratory: No increased work of breathing.  Trachea Midline Eyes: Pupils are equal.  EOM intact without nystagmus.  No scleral icterus  Vascular Exam: warm to touch no edema  lower extremity neuro exam: unremarkable normal strength normal sensation  MSK Exam: Right knee has generalized osteoarthritic bossing.  Small effusion.  Pain with medial and lateral joint line  palpation.  Left hip has pain with Stinchfield testing and logroll.   ASSESSMENT & PLAN:   1. Primary osteoarthritis of right knee   2. Left hip pain   3. AICD (automatic cardioverter/defibrillator) present   4. Right-sided low back pain without sciatica, unspecified chronicity     PLAN: Overall her back hip and knee are the biggest culprit for but she is not a candidate for further diagnostic evaluation with MRI for either of these issues.  We did discuss obtaining a MRI of her lumbar spine to further evaluate for potential radicular symptoms but given the lack of neurologic deficit further diagnostic evaluation can be deferred and continued with continued conservative management which she is content with at this time.  Her right knee we did discuss alternative injections including Zilretta and or Visco supplementation at this point she will plan to hold off till further authorization has been obtained.  We will have her follow-up at her convenience for this.  Follow-up: Return if symptoms worsen or fail to improve.       Please see additional documentation for Objective, Assessment and Plan sections. Pertinent additional documentation may be included in corresponding procedure notes, imaging studies, problem based documentation and patient instructions. Please see these sections of the encounter for additional information regarding this visit.  CMA/ATC served as Neurosurgeon during this visit. History, Physical, and Plan performed by medical provider. Documentation and orders reviewed and attested to.      Andrena Mews, DO    [facility]

## 2017-12-15 LAB — COMPREHENSIVE METABOLIC PANEL
ALT: 30 U/L (ref 0–35)
AST: 20 U/L (ref 0–37)
Albumin: 4 g/dL (ref 3.5–5.2)
Alkaline Phosphatase: 74 U/L (ref 39–117)
BUN: 8 mg/dL (ref 6–23)
CO2: 25 mEq/L (ref 19–32)
Calcium: 9.4 mg/dL (ref 8.4–10.5)
Chloride: 102 mEq/L (ref 96–112)
Creatinine, Ser: 0.69 mg/dL (ref 0.40–1.20)
GFR: 125.07 mL/min (ref >60.00)
Glucose, Bld: 310 mg/dL — ABNORMAL HIGH (ref 70–99)
Potassium: 3.9 mEq/L (ref 3.5–5.1)
Sodium: 138 mEq/L (ref 135–145)
Total Bilirubin: 0.3 mg/dL (ref 0.2–1.2)
Total Protein: 7.3 g/dL (ref 6.0–8.3)

## 2017-12-15 LAB — HEMOGLOBIN A1C: Hgb A1c MFr Bld: 10.6 % — ABNORMAL HIGH (ref 4.6–6.5)

## 2017-12-15 LAB — TSH: TSH: 0.41 u[IU]/mL (ref 0.35–4.50)

## 2017-12-19 ENCOUNTER — Encounter: Payer: Self-pay | Admitting: Family Medicine

## 2017-12-20 ENCOUNTER — Encounter: Payer: Self-pay | Admitting: Sports Medicine

## 2017-12-21 ENCOUNTER — Encounter: Payer: Self-pay | Admitting: Sports Medicine

## 2017-12-24 LAB — CUP PACEART REMOTE DEVICE CHECK
Battery Remaining Longevity: 83 mo
Brady Statistic AP VS Percent: 1 %
Brady Statistic AS VP Percent: 1 %
Brady Statistic AS VS Percent: 99 %
Brady Statistic RA Percent Paced: 0 %
Brady Statistic RV Percent Paced: 1 %
Date Time Interrogation Session: 20190524135514
HIGH POWER IMPEDANCE MEASURED VALUE: 76 Ohm
HighPow Impedance: 77 Ohm
Implantable Lead Implant Date: 20090114
Implantable Lead Location: 753860
Lead Channel Impedance Value: 360 Ohm
Lead Channel Pacing Threshold Amplitude: 0.75 V
Lead Channel Pacing Threshold Amplitude: 0.75 V
Lead Channel Sensing Intrinsic Amplitude: 4.1 mV
Lead Channel Setting Pacing Pulse Width: 0.5 ms
Lead Channel Setting Sensing Sensitivity: 0.5 mV
MDC IDC LEAD IMPLANT DT: 20090114
MDC IDC LEAD LOCATION: 753859
MDC IDC MSMT BATTERY REMAINING PERCENTAGE: 80 %
MDC IDC MSMT BATTERY VOLTAGE: 2.99 V
MDC IDC MSMT LEADCHNL RA PACING THRESHOLD PULSEWIDTH: 0.5 ms
MDC IDC MSMT LEADCHNL RV IMPEDANCE VALUE: 480 Ohm
MDC IDC MSMT LEADCHNL RV PACING THRESHOLD PULSEWIDTH: 0.5 ms
MDC IDC MSMT LEADCHNL RV SENSING INTR AMPL: 11.9 mV
MDC IDC PG IMPLANT DT: 20180215
MDC IDC SET LEADCHNL RA PACING AMPLITUDE: 2 V
MDC IDC SET LEADCHNL RV PACING AMPLITUDE: 2.5 V
MDC IDC STAT BRADY AP VP PERCENT: 1 %
Pulse Gen Serial Number: 7337343

## 2017-12-28 ENCOUNTER — Other Ambulatory Visit: Payer: Self-pay

## 2018-01-25 ENCOUNTER — Telehealth: Payer: Self-pay | Admitting: Family Medicine

## 2018-01-25 NOTE — Telephone Encounter (Signed)
See note.   Copied from CRM [id number]. Topic: Quick Communication - Lab Results >> Jan 25, 2018  9:37 AM Gaynelle Adu wrote: Reason for CRM: Patient is calling to speak with Joellen in regards to her lab results. Her best contact number is [phone].  Please advise

## 2018-01-25 NOTE — Telephone Encounter (Signed)
Called patient she has f/u made per lab note she will come by and pick up samples.

## 2018-02-04 ENCOUNTER — Encounter: Payer: Self-pay | Admitting: Family Medicine

## 2018-02-04 ENCOUNTER — Ambulatory Visit: Payer: Managed Care, Other (non HMO) | Admitting: Family Medicine

## 2018-02-04 DIAGNOSIS — Z0289 Encounter for other administrative examinations: Secondary | ICD-10-CM

## 2018-02-04 NOTE — Progress Notes (Deleted)
Kelly [name] is a 32 y.o. female is here for follow up.  History of Present Illness:   HPI: Health Maintenance Due  Topic Date Due  . OPHTHALMOLOGY EXAM  09/20/1993   Depression screen PHQ 2/9 04/22/2017  Decreased Interest 0  Down, Depressed, Hopeless 0  PHQ - 2 Score 0   PMHx, SurgHx, SocialHx, FamHx, Medications, and Allergies were reviewed in the Visit Navigator and updated as appropriate.   Patient Active Problem List   Diagnosis Date Noted  . Left hip pain 12/14/2017  . Chronic right-sided low back pain without sciatica 06/24/2017  . Primary osteoarthritis of right knee 05/26/2017  . PVC's (premature ventricular contractions) 04/06/2017  . Excessive daytime sleepiness 03/21/2017  . Congestive heart failure (HCC) 12/21/2016  . Memory difficulty 12/21/2016  . PCOS (polycystic ovarian syndrome) 03/29/2015  . Nicotine dependence in remission 03/29/2015  . Dry eyes 02/15/2014  . AICD (automatic cardioverter/defibrillator) present 06/17/2013  . Cardiomyopathy, dilated, nonischemic (HCC) 06/17/2013  . Hypertension 12/21/2011  . Diabetes mellitus (HCC) 05/13/2010   Social History   Tobacco Use  . Smoking status: Former Smoker    Packs/day: 0.50    Years: 10.00    Pack years: 5.00    Types: Cigarettes    Last attempt to quit: 04/06/2017    Years since quitting: 0.8  . Smokeless tobacco: Never Used  Substance Use Topics  . Alcohol use: Yes    Comment: 04/06/2017 "might have a couple drinks q couple weeks"  . Drug use: Yes    Types: Marijuana    Comment: 04/06/2017 "maybe monthly"   Current Medications and Allergies:   Current Outpatient Medications:  .  acetaminophen (TYLENOL) 500 MG tablet, Take 1,000 mg by mouth every 6 (six) hours as needed for moderate pain or headache., Disp: , Rfl:  .  baclofen (LIORESAL) 10 MG tablet, Take 1 tablet (10 mg total) by mouth 3 (three) times daily., Disp: 90 tablet, Rfl: 1 .  carvedilol (COREG) 12.5 MG tablet, Take 1  tablet (12.5 mg total) by mouth 2 (two) times daily with a meal., Disp: 30 tablet, Rfl: 1 .  eplerenone (INSPRA) 25 MG tablet, Take 1 tablet (25 mg total) by mouth daily., Disp: 30 tablet, Rfl: 6 .  losartan (COZAAR) 25 MG tablet, Take 0.5 tablets (12.5 mg total) by mouth daily., Disp: 15 tablet, Rfl: 11 .  metFORMIN (GLUCOPHAGE) 500 MG tablet, Take 1 tablet (500 mg total) by mouth 2 (two) times daily with a meal., Disp: 60 tablet, Rfl: 1 .  norgestimate-ethinyl estradiol (SPRINTEC 28) 0.25-35 MG-MCG tablet, Take 1 tablet by mouth daily., Disp: 3 Package, Rfl: 3 .  potassium chloride SA (K-DUR,KLOR-CON) 20 MEQ tablet, Take 20-40 mEq by mouth See admin instructions. Take 20 MEQ by mouth in the morning and 40 MEQ in the evening, Disp: , Rfl:  .  traMADol (ULTRAM) 50 MG tablet, Take 1 tablet (50 mg total) by mouth every 6 (six) hours as needed for moderate pain., Disp: 30 tablet, Rfl: 0 .  traZODone (DESYREL) 50 MG tablet, Take 0.5-1 tablets (25-50 mg total) by mouth at bedtime as needed for sleep., Disp: 90 tablet, Rfl: 3  Allergies  Allergen Reactions  . Aldactone [Spironolactone] Shortness Of Breath  . Latex Rash   Review of Systems   Pertinent items are noted in the HPI. Otherwise, ROS is negative.  Vitals:  There were no vitals filed for this visit.   There is no height or weight on file to  calculate BMI.  Physical Exam:   Physical Exam Results for orders placed or performed in visit on 12/14/17  TSH  Result Value Ref Range   TSH 0.41 0.35 - 4.50 uIU/mL  Hemoglobin A1c  Result Value Ref Range   Hgb A1c MFr Bld 10.6 (H) 4.6 - 6.5 %  Comprehensive metabolic panel  Result Value Ref Range   Sodium 138 135 - 145 mEq/L   Potassium 3.9 3.5 - 5.1 mEq/L   Chloride 102 96 - 112 mEq/L   CO2 25 19 - 32 mEq/L   Glucose, Bld 310 (H) 70 - 99 mg/dL   BUN 8 6 - 23 mg/dL   Creatinine, Ser 9.97 0.40 - 1.20 mg/dL   Total Bilirubin 0.3 0.2 - 1.2 mg/dL   Alkaline Phosphatase 74 39 - 117 U/L    AST 20 0 - 37 U/L   ALT 30 0 - 35 U/L   Total Protein 7.3 6.0 - 8.3 g/dL   Albumin 4.0 3.5 - 5.2 g/dL   Calcium 9.4 8.4 - 74.1 mg/dL   GFR 423.95 >32.02 mL/min    Assessment and Plan:   There are no diagnoses linked to this encounter.  . Reviewed expectations re: course of current medical issues. . Discussed self-management of symptoms. . Outlined signs and symptoms indicating need for more acute intervention. . Patient verbalized understanding and all questions were answered. Marland Kitchen Health Maintenance issues including appropriate healthy diet, exercise, and smoking avoidance were discussed with patient. . See orders for this visit as documented in the electronic medical record. . Patient received an After Visit Summary.  Helane Rima, DO [place], [facility] 02/04/2018  Future Appointments  Date Time Provider Department Center  02/04/2018  1:40 PM Helane Rima, DO [facility]  03/09/2018 10:30 AM [facility] PA/NP [facility] None  03/09/2018 12:00 PM [facility] DEVICE REMOTES [facility]  03/16/2018  8:00 AM Helane Rima, DO [facility]    *** CMA served as scribe during this visit. History, Physical, and Plan performed by medical provider. The above documentation has been reviewed and is accurate and complete. Helane Rima, D.O.

## 2018-02-10 ENCOUNTER — Encounter: Payer: Self-pay | Admitting: Family Medicine

## 2018-02-10 ENCOUNTER — Ambulatory Visit: Payer: Managed Care, Other (non HMO) | Admitting: Family Medicine

## 2018-02-10 VITALS — BP 124/86 | HR 74 | Temp 98.6°F | Ht 66.0 in | Wt 236.0 lb

## 2018-02-10 DIAGNOSIS — J029 Acute pharyngitis, unspecified: Secondary | ICD-10-CM

## 2018-02-10 MED ORDER — BETAMETHASONE SOD PHOS & ACET 6 (3-3) MG/ML IJ SUSP
6.0000 mg | Freq: Once | INTRAMUSCULAR | Status: AC
  Administered 2018-02-10: 6 mg via INTRAMUSCULAR

## 2018-02-10 NOTE — Progress Notes (Signed)
Patient: ARRYN [name] MRN: [id number] DOB: May 19, 1986 PCP: Helane Rima, DO     Subjective:  Chief Complaint  Patient presents with  . Sore Throat    x 24 hrs    HPI: The patient is a 32 y.o. female who presents today for sore throat, headache, neck stiffness. Symptoms started about 24 hours ago. 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 She is unsure about strep contact. She states she has some sore lymph nodes and it hurts to swallow. No fever/chills. She has a headache, congestion. No stomach pain, N/V. She has used tylenol and this helped some. She is trying to drink, but it hurts to drink liquids as well.  Review of Systems  Constitutional: Positive for chills and fatigue. Negative for fever.  HENT: Positive for congestion and trouble swallowing. Negative for ear pain, sinus pressure and sinus pain.   Respiratory: Negative for shortness of breath.   Cardiovascular: Negative for chest pain.  Gastrointestinal: Negative for abdominal pain and nausea.  Musculoskeletal: Positive for neck stiffness.  Neurological: Positive for headaches. Negative for dizziness.    Allergies Patient is allergic to aldactone [spironolactone] and latex.  Past Medical History Patient  has a past medical history of AICD (automatic cardioverter/defibrillator) present, CHF (congestive heart failure) (HCC), Chronic lower back pain (11/2003), Complication of anesthesia, History of gout, Hypertension, Pneumonia (06/2007), and Type II diabetes mellitus (HCC).  Surgical History Patient  has a past surgical history that includes Cardiac defibrillator placement (07/2007); ICD GENERATOR CHANGEOUT (N/A, 09/03/2016); PVC ABLATION (N/A, 04/06/2017); and Cardiac catheterization (2005).  Family History Pateint's family history includes Diabetes in her maternal grandmother; Heart disease in her maternal grandmother; Hyperlipidemia in her maternal grandmother; Hypertension in her maternal  grandmother; Mental illness in her brother.  Social History Patient  reports that she quit smoking about 10 months ago. Her smoking use included cigarettes. She has a 5.00 pack-year smoking history. She has never used smokeless tobacco. She reports that she drinks alcohol. She reports that she has current or past drug history. Drug: Marijuana.    Objective: Vitals:   02/10/18 1324  BP: 124/86  Pulse: 74  Temp: 98.6 F (37 C)  TempSrc: Oral  SpO2: 98%  Weight: 236 lb (107 kg)  Height: 5\' 6"  (1.676 m)    Body mass index is 38.09 kg/m.  Physical Exam  Constitutional: She appears well-developed and well-nourished.  HENT:  Right Ear: Hearing, tympanic membrane and ear canal normal.  Left Ear: Hearing, tympanic membrane and ear canal normal.  Mouth/Throat: Posterior oropharyngeal erythema present. No tonsillar exudate.  Cardiovascular: Normal rate, regular rhythm and normal heart sounds.  Pulmonary/Chest: Effort normal and breath sounds normal.  Abdominal: Bowel sounds are normal.  Lymphadenopathy:    She has no cervical adenopathy.  Skin: Skin is warm. No rash noted.  Vitals reviewed.  Rapid strep: negative     Assessment/plan: 1. Viral pharyngitis No indications for abx at this time. Discussed conservative therapy with her including warm salt water gurgles, cough drops, warm liquids. If fever/worsening symptoms let pcp know. Will do low dose steroid shot to help inflammation and pain, but may make blood sugars a little elevated. Appears she is not very controlled as it is. Side effects of steroid shot discussed as well.  - POCT rapid strep A - betamethasone acetate-betamethasone sodium phosphate (CELESTONE) injection 6 mg      Return if symptoms worsen or fail to improve.     Revonda Standard  Artis Flock, MD [facility]   02/14/2018

## 2018-02-10 NOTE — Patient Instructions (Signed)

## 2018-02-14 ENCOUNTER — Telehealth: Payer: Self-pay | Admitting: Family Medicine

## 2018-02-14 LAB — POCT RAPID STREP A (OFFICE): RAPID STREP A SCREEN: NEGATIVE

## 2018-02-14 NOTE — Telephone Encounter (Signed)
Patient dismissed from [facility] by Helane Rima DO, effective February 10, 2018. Dismissal letter sent out by certified / registered mail.  daj

## 2018-02-22 ENCOUNTER — Encounter (HOSPITAL_COMMUNITY): Payer: Self-pay

## 2018-02-22 ENCOUNTER — Other Ambulatory Visit: Payer: Self-pay

## 2018-02-22 ENCOUNTER — Emergency Department (HOSPITAL_COMMUNITY)
Admission: EM | Admit: 2018-02-22 | Discharge: 2018-02-22 | Disposition: A | Discharge status: Home / Self Care | Payer: Managed Care, Other (non HMO) | Attending: Emergency Medicine | Admitting: Emergency Medicine

## 2018-02-22 DIAGNOSIS — Z7984 Long term (current) use of oral hypoglycemic drugs: Secondary | ICD-10-CM | POA: Insufficient documentation

## 2018-02-22 DIAGNOSIS — Z79899 Other long term (current) drug therapy: Secondary | ICD-10-CM | POA: Insufficient documentation

## 2018-02-22 DIAGNOSIS — I509 Heart failure, unspecified: Secondary | ICD-10-CM | POA: Insufficient documentation

## 2018-02-22 DIAGNOSIS — E119 Type 2 diabetes mellitus without complications: Secondary | ICD-10-CM | POA: Insufficient documentation

## 2018-02-22 DIAGNOSIS — R21 Rash and other nonspecific skin eruption: Secondary | ICD-10-CM | POA: Insufficient documentation

## 2018-02-22 DIAGNOSIS — Z87891 Personal history of nicotine dependence: Secondary | ICD-10-CM | POA: Insufficient documentation

## 2018-02-22 DIAGNOSIS — I110 Hypertensive heart disease with heart failure: Secondary | ICD-10-CM | POA: Insufficient documentation

## 2018-02-22 MED ORDER — PREDNISONE 20 MG PO TABS: 40.0000 mg | ORAL_TABLET | Freq: Every day | ORAL | 0 refills | Status: AC

## 2018-02-22 MED ORDER — PREDNISONE 20 MG PO TABS
40.0000 mg | ORAL_TABLET | Freq: Once | ORAL | Status: AC
  Administered 2018-02-22: 40 mg via ORAL
  Filled 2018-02-22: qty 2

## 2018-02-22 MED ORDER — DIPHENHYDRAMINE HCL 25 MG PO CAPS
25.0000 mg | ORAL_CAPSULE | Freq: Once | ORAL | Status: AC
  Administered 2018-02-22: 25 mg via ORAL
  Filled 2018-02-22: qty 1

## 2018-02-22 NOTE — ED Triage Notes (Signed)
Patient c/o rash "all over" x 2 days. patient states worse today. Patient denies any SOB or swallowing difficulties.

## 2018-02-22 NOTE — Discharge Instructions (Addendum)
Your rash looks as if it is allergic.  Please take prednisone 40 mg daily for 5 days, you got your first dose in the ER and do not need to take another dose until tomorrow.  I have sent this prescription to your pharmacy.  You can also take Benadryl 25 mg as needed for itching.  Medicines like Pepcid over-the-counter can also help with itching.  Schedule an appointment with your primary care doctor in 2 days if your symptoms are not improving.  Return to the ER if you have any new or concerning symptoms like face swelling, feeling as if your throat is closing, trouble breathing or fever.
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
## 2018-03-09 ENCOUNTER — Ambulatory Visit (HOSPITAL_COMMUNITY)
Admission: RE | Admit: 2018-03-09 | Discharge: 2018-03-09 | Disposition: A | Discharge status: Home / Self Care | Payer: Managed Care, Other (non HMO) | Source: Ambulatory Visit | Attending: Internal Medicine | Admitting: Internal Medicine

## 2018-03-09 ENCOUNTER — Telehealth: Payer: Self-pay | Admitting: Cardiology

## 2018-03-09 ENCOUNTER — Ambulatory Visit (INDEPENDENT_AMBULATORY_CARE_PROVIDER_SITE_OTHER): Payer: Managed Care, Other (non HMO) | Admitting: *Deleted

## 2018-03-09 VITALS — BP 132/90 | HR 81 | Wt 227.6 lb

## 2018-03-09 DIAGNOSIS — Z7984 Long term (current) use of oral hypoglycemic drugs: Secondary | ICD-10-CM | POA: Diagnosis not present

## 2018-03-09 DIAGNOSIS — Z79899 Other long term (current) drug therapy: Secondary | ICD-10-CM | POA: Diagnosis not present

## 2018-03-09 DIAGNOSIS — E669 Obesity, unspecified: Secondary | ICD-10-CM

## 2018-03-09 DIAGNOSIS — Z95810 Presence of automatic (implantable) cardiac defibrillator: Secondary | ICD-10-CM | POA: Diagnosis not present

## 2018-03-09 DIAGNOSIS — I5022 Chronic systolic (congestive) heart failure: Secondary | ICD-10-CM

## 2018-03-09 DIAGNOSIS — Z91040 Latex allergy status: Secondary | ICD-10-CM | POA: Insufficient documentation

## 2018-03-09 DIAGNOSIS — Z818 Family history of other mental and behavioral disorders: Secondary | ICD-10-CM | POA: Insufficient documentation

## 2018-03-09 DIAGNOSIS — Z888 Allergy status to other drugs, medicaments and biological substances status: Secondary | ICD-10-CM | POA: Insufficient documentation

## 2018-03-09 DIAGNOSIS — I110 Hypertensive heart disease with heart failure: Secondary | ICD-10-CM | POA: Diagnosis not present

## 2018-03-09 DIAGNOSIS — F17210 Nicotine dependence, cigarettes, uncomplicated: Secondary | ICD-10-CM | POA: Diagnosis not present

## 2018-03-09 DIAGNOSIS — Z6836 Body mass index (BMI) 36.0-36.9, adult: Secondary | ICD-10-CM | POA: Insufficient documentation

## 2018-03-09 DIAGNOSIS — I10 Essential (primary) hypertension: Secondary | ICD-10-CM

## 2018-03-09 DIAGNOSIS — I428 Other cardiomyopathies: Secondary | ICD-10-CM | POA: Insufficient documentation

## 2018-03-09 DIAGNOSIS — I493 Ventricular premature depolarization: Secondary | ICD-10-CM | POA: Diagnosis not present

## 2018-03-09 DIAGNOSIS — I420 Dilated cardiomyopathy: Secondary | ICD-10-CM | POA: Diagnosis not present

## 2018-03-09 DIAGNOSIS — E119 Type 2 diabetes mellitus without complications: Secondary | ICD-10-CM | POA: Diagnosis not present

## 2018-03-09 LAB — BASIC METABOLIC PANEL
Anion gap: 7 (ref 5–15)
BUN: 7 mg/dL (ref 6–20)
CHLORIDE: 108 mmol/L (ref 98–111)
CO2: 23 mmol/L (ref 22–32)
Calcium: 9.1 mg/dL (ref 8.9–10.3)
Creatinine, Ser: 0.6 mg/dL (ref 0.44–1.00)
Glucose, Bld: 214 mg/dL — ABNORMAL HIGH (ref 70–99)
Potassium: 3.9 mmol/L (ref 3.5–5.1)
SODIUM: 138 mmol/L (ref 135–145)

## 2018-03-09 MED ORDER — CARVEDILOL 12.5 MG PO TABS: 18.7500 mg | ORAL_TABLET | Freq: Two times a day (BID) | ORAL | 3 refills | Status: DC

## 2018-03-09 NOTE — Patient Instructions (Signed)
INCREASE Carvedilol to 18.75 mg (1.5 Tablets) Twice Daily.  Echocardiogram and Follow up in January 2020.  Please contact our clinic in December to schedule your appointments. [phone], Option 3.
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
## 2018-03-09 NOTE — Telephone Encounter (Signed)
Spoke with pt and reminded pt of remote transmission that is due today. Pt verbalized understanding.   

## 2018-03-10 NOTE — Progress Notes (Signed)
Remote ICD transmission.   

## 2018-03-11 ENCOUNTER — Encounter: Payer: Self-pay | Admitting: Cardiology

## 2018-03-11 ENCOUNTER — Ambulatory Visit: Payer: Managed Care, Other (non HMO) | Admitting: Family Medicine

## 2018-03-15 NOTE — Telephone Encounter (Signed)
Certified dismissal letter returned as undeliverable, unclaimed, return to sender after three attempts by [employer] on March 15, 2018. Letter placed in another envelope and resent as 1st class mail which does not require a signature. [email or web address]

## 2018-03-16 ENCOUNTER — Ambulatory Visit: Payer: 59 | Admitting: Family Medicine

## 2018-04-01 ENCOUNTER — Ambulatory Visit: Payer: Managed Care, Other (non HMO) | Admitting: Family Medicine

## 2018-04-15 LAB — CUP PACEART REMOTE DEVICE CHECK
Brady Statistic AP VP Percent: 1 %
Brady Statistic AP VS Percent: 1 %
Brady Statistic AS VP Percent: 1 %
Brady Statistic AS VS Percent: 99 %
Brady Statistic RA Percent Paced: 0 %
Brady Statistic RV Percent Paced: 1 %
HighPow Impedance: 88 Ohm
HighPow Impedance: 88 Ohm
Implantable Lead Implant Date: 20090114
Implantable Lead Implant Date: 20090114
Implantable Pulse Generator Implant Date: 20180215
Lead Channel Impedance Value: 400 Ohm
Lead Channel Pacing Threshold Amplitude: 0.75 V
Lead Channel Pacing Threshold Amplitude: 0.75 V
Lead Channel Pacing Threshold Pulse Width: 0.5 ms
Lead Channel Sensing Intrinsic Amplitude: 4.2 mV
Lead Channel Setting Pacing Amplitude: 2.5 V
Lead Channel Setting Pacing Pulse Width: 0.5 ms
Lead Channel Setting Sensing Sensitivity: 0.5 mV
MDC IDC LEAD LOCATION: 753859
MDC IDC LEAD LOCATION: 753860
MDC IDC MSMT BATTERY REMAINING LONGEVITY: 81 mo
MDC IDC MSMT BATTERY REMAINING PERCENTAGE: 77 %
MDC IDC MSMT BATTERY VOLTAGE: 2.98 V
MDC IDC MSMT LEADCHNL RA PACING THRESHOLD PULSEWIDTH: 0.5 ms
MDC IDC MSMT LEADCHNL RV IMPEDANCE VALUE: 450 Ohm
MDC IDC MSMT LEADCHNL RV SENSING INTR AMPL: 11.9 mV
MDC IDC SESS DTM: 20190821141717
MDC IDC SET LEADCHNL RA PACING AMPLITUDE: 2 V
Pulse Gen Serial Number: 7337343

## 2018-06-20 ENCOUNTER — Telehealth: Payer: Self-pay

## 2018-06-20 NOTE — Telephone Encounter (Signed)
Spoke with pt and reminded pt of remote transmission that is due today. Pt verbalized understanding.   

## 2018-06-22 ENCOUNTER — Encounter (HOSPITAL_COMMUNITY): Payer: Self-pay | Admitting: Emergency Medicine

## 2018-06-22 ENCOUNTER — Other Ambulatory Visit: Payer: Self-pay

## 2018-06-22 ENCOUNTER — Emergency Department (HOSPITAL_COMMUNITY)
Admission: EM | Admit: 2018-06-22 | Discharge: 2018-06-22 | Disposition: A | Discharge status: Home / Self Care | Payer: Self-pay | Attending: Emergency Medicine | Admitting: Emergency Medicine

## 2018-06-22 DIAGNOSIS — F419 Anxiety disorder, unspecified: Secondary | ICD-10-CM | POA: Insufficient documentation

## 2018-06-22 DIAGNOSIS — E119 Type 2 diabetes mellitus without complications: Secondary | ICD-10-CM | POA: Insufficient documentation

## 2018-06-22 DIAGNOSIS — Z79899 Other long term (current) drug therapy: Secondary | ICD-10-CM | POA: Insufficient documentation

## 2018-06-22 DIAGNOSIS — I509 Heart failure, unspecified: Secondary | ICD-10-CM | POA: Insufficient documentation

## 2018-06-22 DIAGNOSIS — I110 Hypertensive heart disease with heart failure: Secondary | ICD-10-CM | POA: Insufficient documentation

## 2018-06-22 DIAGNOSIS — F32A Depression, unspecified: Secondary | ICD-10-CM

## 2018-06-22 DIAGNOSIS — F17210 Nicotine dependence, cigarettes, uncomplicated: Secondary | ICD-10-CM | POA: Insufficient documentation

## 2018-06-22 DIAGNOSIS — F329 Major depressive disorder, single episode, unspecified: Secondary | ICD-10-CM | POA: Insufficient documentation

## 2018-06-22 DIAGNOSIS — Z7984 Long term (current) use of oral hypoglycemic drugs: Secondary | ICD-10-CM | POA: Insufficient documentation

## 2018-06-22 DIAGNOSIS — Z95810 Presence of automatic (implantable) cardiac defibrillator: Secondary | ICD-10-CM | POA: Insufficient documentation

## 2018-06-22 LAB — CBC
HCT: 41.8 % (ref 36.0–46.0)
Hemoglobin: 13.1 g/dL (ref 12.0–15.0)
MCH: 28.4 pg (ref 26.0–34.0)
MCHC: 31.3 g/dL (ref 30.0–36.0)
MCV: 90.5 fL (ref 80.0–100.0)
Platelets: 287 10*3/uL (ref 150–400)
RBC: 4.62 MIL/uL (ref 3.87–5.11)
RDW: 12.7 % (ref 11.5–15.5)
WBC: 7.5 10*3/uL (ref 4.0–10.5)
nRBC: 0 % (ref 0.0–0.2)

## 2018-06-22 LAB — COMPREHENSIVE METABOLIC PANEL
ALT: 28 U/L (ref 0–44)
AST: 23 U/L (ref 15–41)
Albumin: 4.1 g/dL (ref 3.5–5.0)
Alkaline Phosphatase: 58 U/L (ref 38–126)
Anion gap: 8 (ref 5–15)
BUN: 8 mg/dL (ref 6–20)
CO2: 26 mmol/L (ref 22–32)
Calcium: 9.1 mg/dL (ref 8.9–10.3)
Chloride: 103 mmol/L (ref 98–111)
Creatinine, Ser: 0.65 mg/dL (ref 0.44–1.00)
GFR calc Af Amer: >60 mL/min (ref >60)
GFR calc non Af Amer: >60 mL/min (ref >60)
Glucose, Bld: 251 mg/dL — ABNORMAL HIGH (ref 70–99)
Potassium: 3.7 mmol/L (ref 3.5–5.1)
Sodium: 137 mmol/L (ref 135–145)
Total Bilirubin: 0.4 mg/dL (ref 0.3–1.2)
Total Protein: 7.9 g/dL (ref 6.5–8.1)

## 2018-06-22 LAB — HCG, QUANTITATIVE, PREGNANCY: hCG, Beta Chain, Quant, S: <1 m[IU]/mL (ref <5)

## 2018-06-22 LAB — SALICYLATE LEVEL: Salicylate Lvl: <7 mg/dL (ref 2.8–30.0)

## 2018-06-22 LAB — ACETAMINOPHEN LEVEL: Acetaminophen (Tylenol), Serum: <10 ug/mL — ABNORMAL LOW (ref 10–30)

## 2018-06-22 LAB — ETHANOL: Alcohol, Ethyl (B): <10 mg/dL (ref <10)

## 2018-06-22 NOTE — ED Notes (Signed)
Instructed pt to get an urine sample 

## 2018-06-22 NOTE — ED Triage Notes (Signed)
Patient reports anxiety for 1 week. Patient reports difficulty eating, sleeping, and focusing. Tearful in Triage.
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
## 2018-06-22 NOTE — ED Notes (Signed)
Advised patient we needed urine specimen. 

## 2018-06-23 ENCOUNTER — Encounter: Payer: Self-pay | Admitting: Cardiology

## 2018-07-16 IMAGING — DX DG CHEST 2V
2 series · 2 of 2 positions shown · non-contrast
Comparison: 01/14/2016

CLINICAL DATA: Pacemaker beating irregularly for 1.5 weeks worse
over past 2 days, increased weight gain, mid chest pain, shortness
of breath, coughing congestion, dizziness, history hypertension,
diabetes mellitus, smoker

EXAM:
CHEST  2 VIEW

[chest pa]
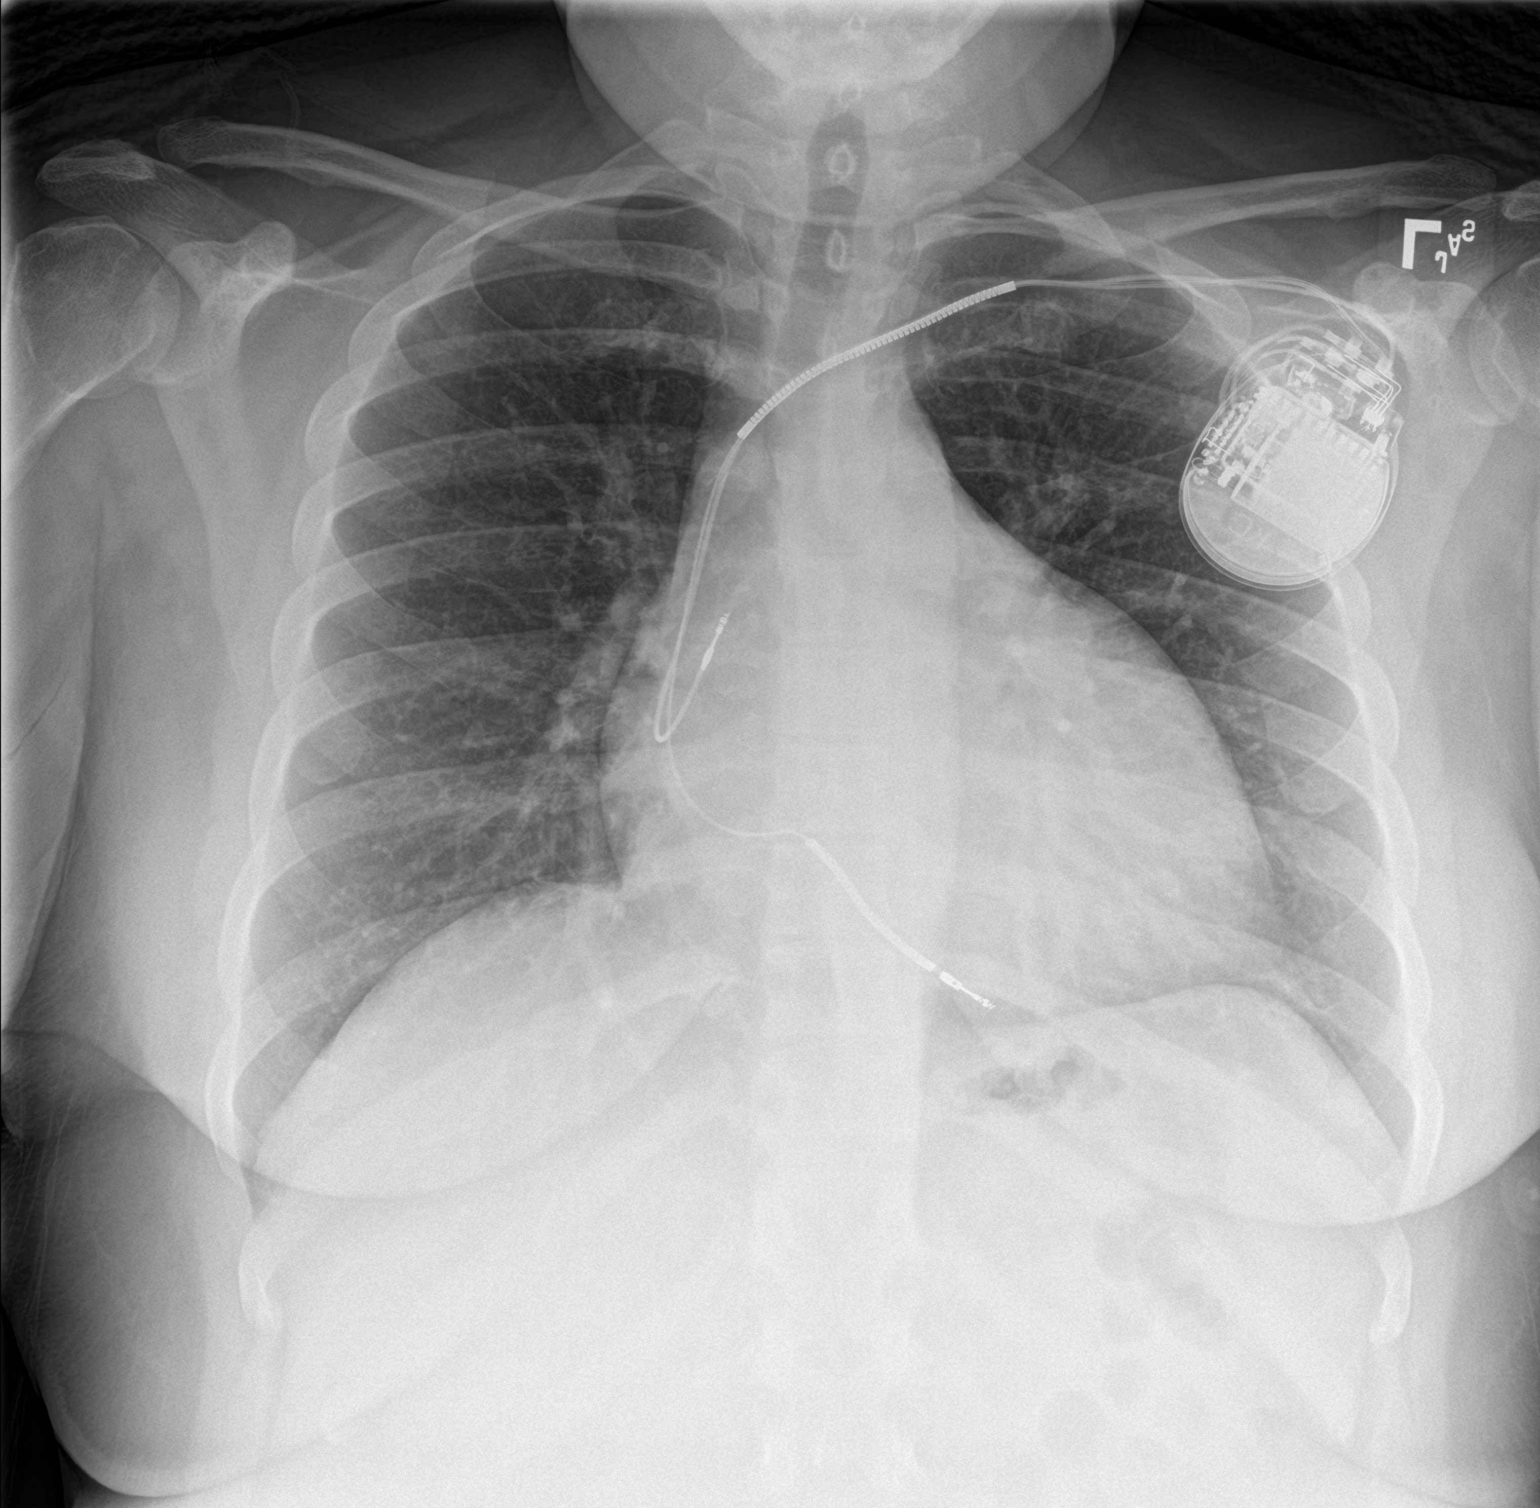

[chest lat]
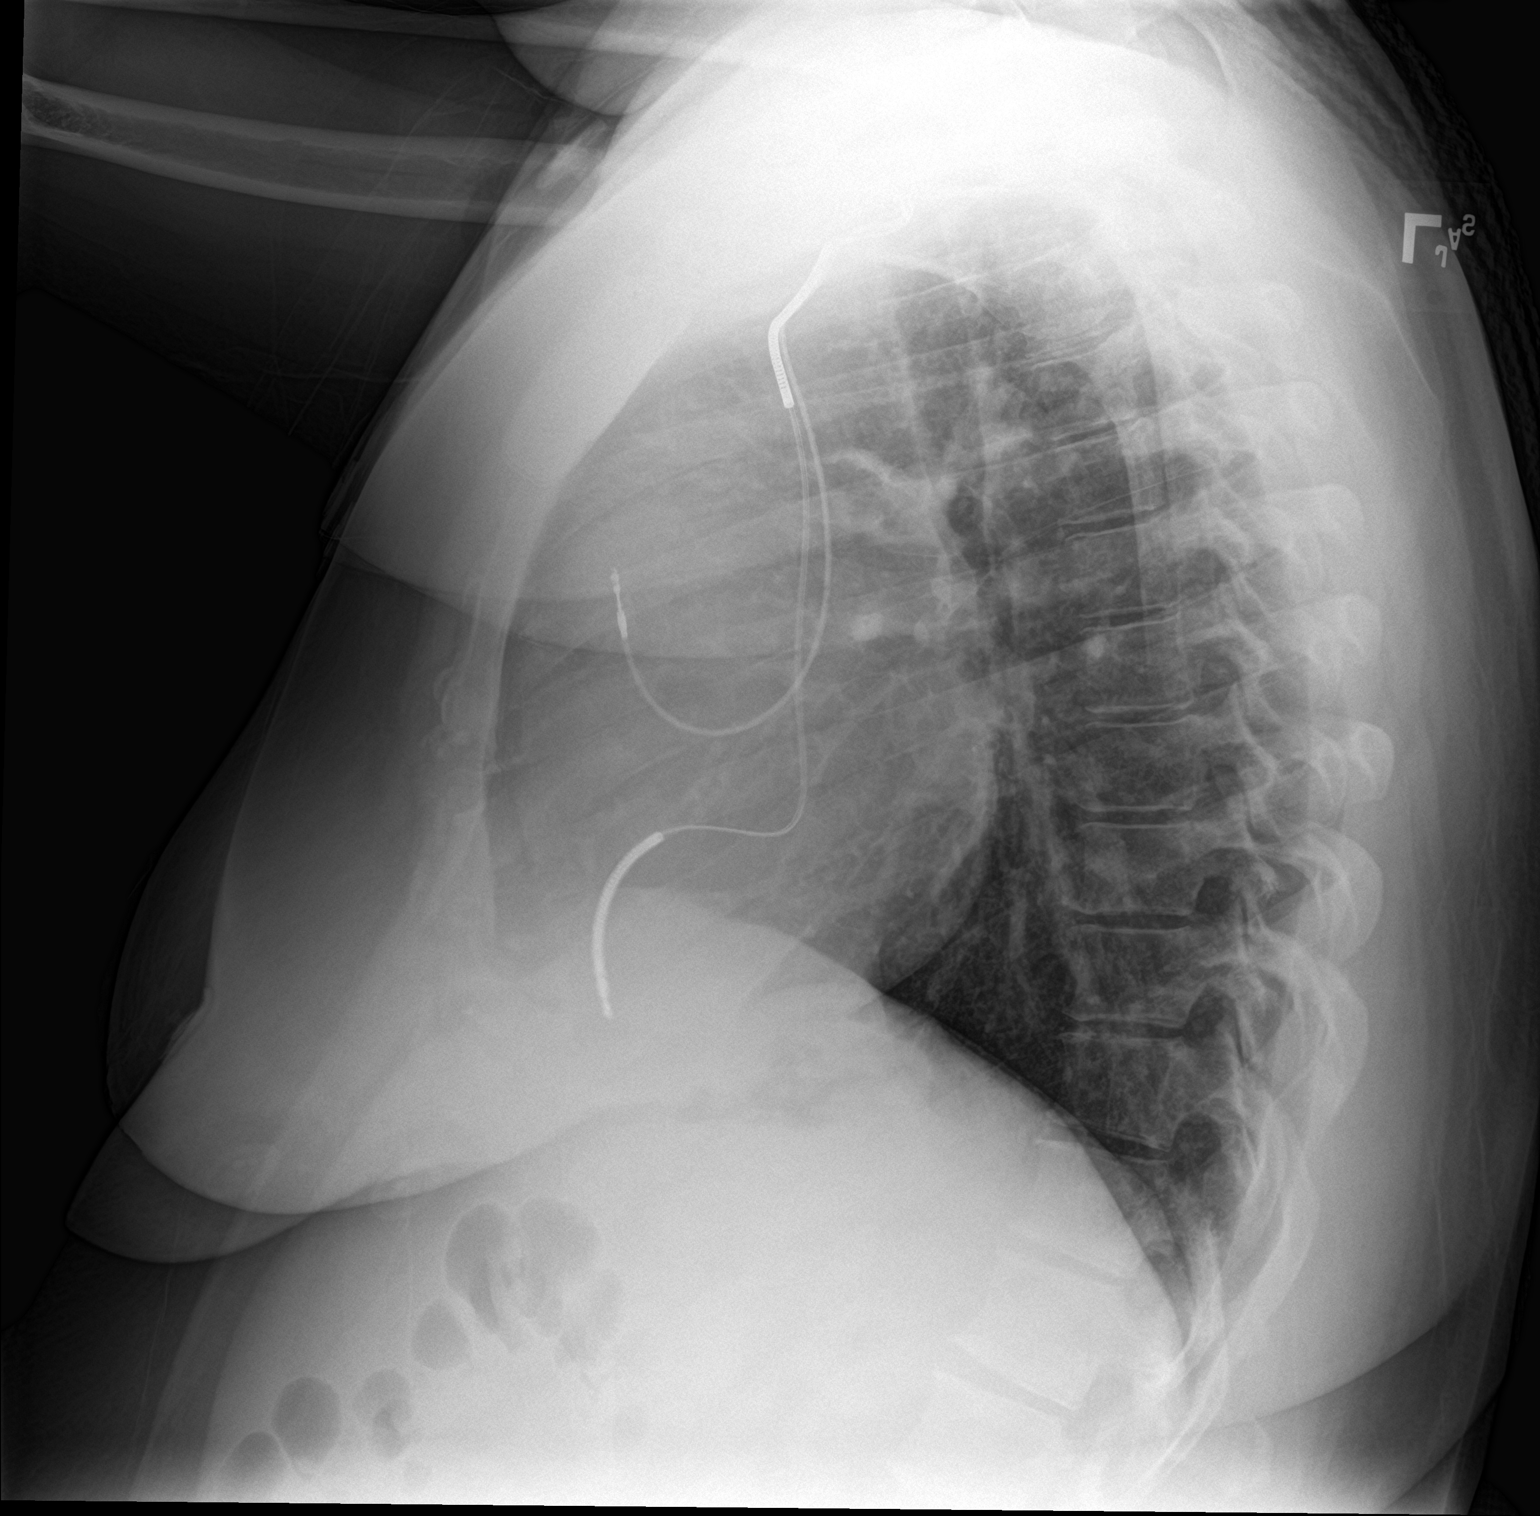

[2 of 2 positions shown; findings below may reference images not displayed]

FINDINGS: LEFT subclavian AICD with leads projecting at RIGHT atrium and RIGHT
ventricle, unchanged.

Enlargement of cardiac silhouette.

Mediastinal contours and pulmonary vascularity normal.

Lungs clear.

No pleural effusion or pneumothorax.

Bones unremarkable.
IMPRESSION: Mild enlargement of cardiac silhouette post AICD.

No acute abnormalities.

## 2018-07-22 ENCOUNTER — Telehealth: Payer: Self-pay | Admitting: *Deleted

## 2018-07-22 NOTE — Telephone Encounter (Signed)
Patient called in reporting increased ShOB, intermittent left chest pain, feeling overly fatigued x1 week. Feels heavy like she is holding fluid. Chest pain feels like someone is "twisting" her chest muscles, lasts about 30sec at a time, nothing makes it worse or better. She is up 7lbs over the past month.  Patient took 40mg  of furosemide yesterday (ordered 20mg  PRN), feels like she was able to get rid of some fluid, but still feels bloated and ShOB. She has been taking potassium BID.  Merlin monitor is broken, awaiting new monitor from tech services so unable to send transmission for review of [employer]. Advised patient I will contact [facility] to determine best course of action and call her back.  Spoke with Marylene Land, will route this message to [facility] Clinic pool.  Patient aware that she should be contacted this afternoon with recommendations. Gave direct number to [facility].

## 2018-07-22 NOTE — Telephone Encounter (Signed)
Spoke with patient. She reports she attempted to call HF clinic, but did not speak with anyone. Patient is not audibly short of breath during our conversation. Patient reports she has taken PRN furosemide for the past 3 days. Advised patient to continue taking PRN furosemide and scheduled potassium through the weekend. Continue daily weights. Encouraged fluid and sodium restriction. Advised patient to seek emergency medical attention over the weekend if any new or worsening symptoms. Patient verbalized understanding and thanked me for my call.

## 2018-07-25 ENCOUNTER — Telehealth (HOSPITAL_COMMUNITY): Payer: Self-pay

## 2018-07-25 NOTE — Telephone Encounter (Signed)
We have not seen her since August. She can take 40 mg lasix x 3 days. She will need a BMET later this week. If she does not improve, she should be seen.

## 2018-07-25 NOTE — Telephone Encounter (Signed)
Pt called stating that she is experiencing SOB, weight gain of 3 lbs since 01/02, muscle cramps, mild chest pain, and fatigue. Pt is suppose to take furosemide 20 mg as needed and pt reported that she is taking 40 mg every other day. Please advise.

## 2018-07-25 NOTE — Telephone Encounter (Signed)
Pt notified. Verbalizes understanding. Lab appt made. 

## 2018-07-26 ENCOUNTER — Emergency Department (HOSPITAL_COMMUNITY): Payer: No Typology Code available for payment source

## 2018-07-26 ENCOUNTER — Other Ambulatory Visit: Payer: Self-pay

## 2018-07-26 ENCOUNTER — Encounter (HOSPITAL_COMMUNITY): Payer: Self-pay

## 2018-07-26 ENCOUNTER — Emergency Department (HOSPITAL_COMMUNITY)
Admission: EM | Admit: 2018-07-26 | Discharge: 2018-07-26 | Disposition: A | Discharge status: Home / Self Care | Payer: No Typology Code available for payment source | Attending: Emergency Medicine | Admitting: Emergency Medicine

## 2018-07-26 DIAGNOSIS — M549 Dorsalgia, unspecified: Secondary | ICD-10-CM | POA: Insufficient documentation

## 2018-07-26 DIAGNOSIS — Z95810 Presence of automatic (implantable) cardiac defibrillator: Secondary | ICD-10-CM | POA: Diagnosis not present

## 2018-07-26 DIAGNOSIS — Y939 Activity, unspecified: Secondary | ICD-10-CM | POA: Insufficient documentation

## 2018-07-26 DIAGNOSIS — I110 Hypertensive heart disease with heart failure: Secondary | ICD-10-CM | POA: Insufficient documentation

## 2018-07-26 DIAGNOSIS — M542 Cervicalgia: Secondary | ICD-10-CM | POA: Insufficient documentation

## 2018-07-26 DIAGNOSIS — Z91040 Latex allergy status: Secondary | ICD-10-CM | POA: Insufficient documentation

## 2018-07-26 DIAGNOSIS — F17210 Nicotine dependence, cigarettes, uncomplicated: Secondary | ICD-10-CM | POA: Diagnosis not present

## 2018-07-26 DIAGNOSIS — Y998 Other external cause status: Secondary | ICD-10-CM | POA: Insufficient documentation

## 2018-07-26 DIAGNOSIS — E119 Type 2 diabetes mellitus without complications: Secondary | ICD-10-CM | POA: Insufficient documentation

## 2018-07-26 DIAGNOSIS — I509 Heart failure, unspecified: Secondary | ICD-10-CM | POA: Diagnosis not present

## 2018-07-26 DIAGNOSIS — M25512 Pain in left shoulder: Secondary | ICD-10-CM | POA: Diagnosis present

## 2018-07-26 DIAGNOSIS — Y92410 Unspecified street and highway as the place of occurrence of the external cause: Secondary | ICD-10-CM | POA: Insufficient documentation

## 2018-07-26 MED ORDER — ACETAMINOPHEN 500 MG PO TABS
1000.0000 mg | ORAL_TABLET | Freq: Once | ORAL | Status: AC
  Administered 2018-07-26: 1000 mg via ORAL
  Filled 2018-07-26: qty 2
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
## 2018-07-26 NOTE — ED Notes (Signed)
Patient transported to X-ray 

## 2018-07-26 NOTE — ED Notes (Signed)
Urine sent to main lab.

## 2018-07-26 NOTE — Discharge Instructions (Addendum)
Take [phone] mg of Tylenol every  6 hours as needed for pain. Do not exceed 4000 mg of Tylenol daily.  Apply ice or heat, whichever feels best, 2-3 times daily for 20 minutes at a time for the next few days.  Do some gentle stretching throughout the day, especially during hot showers or baths. Take short frequent walks and avoid prolonged periods of sitting or laying. Expect to be sore for the next few day and follow up with primary care physician for recheck of ongoing symptoms but return to ER for emergent changing or worsening of symptoms such as severe headache that gets worse, altered mental status/behaving unusually, persistent vomiting, excessive drowsiness, numbness to the arms or legs, unsteady gait, or slurred speech.
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
## 2018-07-26 NOTE — ED Notes (Signed)
Discharge instructions discussed with Pt. Pt verbalized understanding. Pt stable and ambulatory.    

## 2018-07-29 ENCOUNTER — Ambulatory Visit (HOSPITAL_COMMUNITY)
Admission: RE | Admit: 2018-07-29 | Discharge: 2018-07-29 | Disposition: A | Discharge status: Home / Self Care | Payer: No Typology Code available for payment source | Source: Ambulatory Visit | Attending: Cardiology | Admitting: Cardiology

## 2018-07-29 ENCOUNTER — Encounter (HOSPITAL_COMMUNITY): Payer: Self-pay | Admitting: Cardiology

## 2018-07-29 DIAGNOSIS — I5022 Chronic systolic (congestive) heart failure: Secondary | ICD-10-CM | POA: Diagnosis not present

## 2018-07-29 LAB — BASIC METABOLIC PANEL
Anion gap: 7 (ref 5–15)
BUN: 6 mg/dL (ref 6–20)
CO2: 22 mmol/L (ref 22–32)
Calcium: 9.4 mg/dL (ref 8.9–10.3)
Chloride: 106 mmol/L (ref 98–111)
Creatinine, Ser: 0.78 mg/dL (ref 0.44–1.00)
GFR calc Af Amer: >60 mL/min (ref >60)
GLUCOSE: 392 mg/dL — AB (ref 70–99)
Potassium: 4.2 mmol/L (ref 3.5–5.1)
Sodium: 135 mmol/L (ref 135–145)

## 2018-08-01 ENCOUNTER — Encounter (HOSPITAL_COMMUNITY): Payer: Self-pay

## 2018-08-01 ENCOUNTER — Ambulatory Visit (HOSPITAL_COMMUNITY)
Admission: RE | Admit: 2018-08-01 | Discharge: 2018-08-01 | Disposition: A | Discharge status: Home / Self Care | Payer: No Typology Code available for payment source | Source: Ambulatory Visit | Attending: Internal Medicine | Admitting: Internal Medicine

## 2018-08-01 VITALS — BP 166/108 | HR 88 | Wt 232.8 lb

## 2018-08-01 DIAGNOSIS — I10 Essential (primary) hypertension: Secondary | ICD-10-CM | POA: Diagnosis not present

## 2018-08-01 DIAGNOSIS — Z91040 Latex allergy status: Secondary | ICD-10-CM | POA: Insufficient documentation

## 2018-08-01 DIAGNOSIS — Z95810 Presence of automatic (implantable) cardiac defibrillator: Secondary | ICD-10-CM

## 2018-08-01 DIAGNOSIS — F17210 Nicotine dependence, cigarettes, uncomplicated: Secondary | ICD-10-CM | POA: Insufficient documentation

## 2018-08-01 DIAGNOSIS — Z720 Tobacco use: Secondary | ICD-10-CM | POA: Diagnosis not present

## 2018-08-01 DIAGNOSIS — Z888 Allergy status to other drugs, medicaments and biological substances status: Secondary | ICD-10-CM | POA: Diagnosis not present

## 2018-08-01 DIAGNOSIS — E1165 Type 2 diabetes mellitus with hyperglycemia: Secondary | ICD-10-CM | POA: Diagnosis not present

## 2018-08-01 DIAGNOSIS — I110 Hypertensive heart disease with heart failure: Secondary | ICD-10-CM | POA: Insufficient documentation

## 2018-08-01 DIAGNOSIS — I5022 Chronic systolic (congestive) heart failure: Secondary | ICD-10-CM | POA: Diagnosis not present

## 2018-08-01 DIAGNOSIS — M109 Gout, unspecified: Secondary | ICD-10-CM | POA: Insufficient documentation

## 2018-08-01 DIAGNOSIS — I428 Other cardiomyopathies: Secondary | ICD-10-CM | POA: Insufficient documentation

## 2018-08-01 DIAGNOSIS — I493 Ventricular premature depolarization: Secondary | ICD-10-CM | POA: Insufficient documentation

## 2018-08-01 DIAGNOSIS — Z6838 Body mass index (BMI) 38.0-38.9, adult: Secondary | ICD-10-CM | POA: Diagnosis not present

## 2018-08-01 DIAGNOSIS — Z7984 Long term (current) use of oral hypoglycemic drugs: Secondary | ICD-10-CM | POA: Diagnosis not present

## 2018-08-01 DIAGNOSIS — Z8249 Family history of ischemic heart disease and other diseases of the circulatory system: Secondary | ICD-10-CM | POA: Insufficient documentation

## 2018-08-01 DIAGNOSIS — Z79899 Other long term (current) drug therapy: Secondary | ICD-10-CM | POA: Diagnosis not present

## 2018-08-01 DIAGNOSIS — E669 Obesity, unspecified: Secondary | ICD-10-CM

## 2018-08-01 LAB — BASIC METABOLIC PANEL
Anion gap: 10 (ref 5–15)
BUN: 5 mg/dL — AB (ref 6–20)
CO2: 20 mmol/L — ABNORMAL LOW (ref 22–32)
Calcium: 9.1 mg/dL (ref 8.9–10.3)
Chloride: 106 mmol/L (ref 98–111)
Creatinine, Ser: 0.67 mg/dL (ref 0.44–1.00)
GFR calc Af Amer: >60 mL/min (ref >60)
GFR calc non Af Amer: >60 mL/min (ref >60)
Glucose, Bld: 256 mg/dL — ABNORMAL HIGH (ref 70–99)
Potassium: 4 mmol/L (ref 3.5–5.1)
Sodium: 136 mmol/L (ref 135–145)

## 2018-08-01 LAB — BRAIN NATRIURETIC PEPTIDE: B NATRIURETIC PEPTIDE 5: 22.2 pg/mL (ref 0.0–100.0)

## 2018-08-01 MED ORDER — ISOSORB DINITRATE-HYDRALAZINE 20-37.5 MG PO TABS: 1.0000 | ORAL_TABLET | Freq: Three times a day (TID) | ORAL | 3 refills | Status: AC
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
## 2018-08-01 NOTE — Addendum Note (Signed)
Encounter addended by: Marcy Siren, LCSW on: 08/01/2018 12:40 PM  Actions taken: Clinical Note Signed

## 2018-08-01 NOTE — Progress Notes (Signed)
CSW asked to assist patient with obtaining a PCP. CSW provided patient with a list and suggested they check the online list of participating providers through her insurance company and compare with list provided in the clinic today. Patient verbalizes understanding of follow up and will return call if further needs arise. Lasandra Beech, LCSW, CCSW-MCS [phone]

## 2018-08-01 NOTE — Patient Instructions (Addendum)
Labs done today  EKG done today  Your physician has requested that you have an echocardiogram. Echocardiography is a painless test that uses sound waves to create images of your heart. It provides your doctor with information about the size and shape of your heart and how well your heart's chambers and valves are working. This procedure takes approximately one hour. There are no restrictions for this procedure.  START Bidil 1 tablet three times daily  TEMPORARY INCREASE Furosemide 80mg  (2 tabs) daily for two days only. Then resume Furosemide 40mg  (1 tab) daily.

## 2018-08-09 ENCOUNTER — Ambulatory Visit (HOSPITAL_COMMUNITY)
Admission: RE | Admit: 2018-08-09 | Discharge: 2018-08-09 | Disposition: A | Discharge status: Home / Self Care | Payer: No Typology Code available for payment source | Source: Ambulatory Visit | Attending: Internal Medicine | Admitting: Internal Medicine

## 2018-08-09 DIAGNOSIS — Z720 Tobacco use: Secondary | ICD-10-CM | POA: Diagnosis not present

## 2018-08-09 DIAGNOSIS — E785 Hyperlipidemia, unspecified: Secondary | ICD-10-CM | POA: Insufficient documentation

## 2018-08-09 DIAGNOSIS — I5022 Chronic systolic (congestive) heart failure: Secondary | ICD-10-CM | POA: Diagnosis not present

## 2018-08-09 DIAGNOSIS — E119 Type 2 diabetes mellitus without complications: Secondary | ICD-10-CM | POA: Diagnosis not present

## 2018-08-09 DIAGNOSIS — I110 Hypertensive heart disease with heart failure: Secondary | ICD-10-CM | POA: Insufficient documentation

## 2018-08-09 DIAGNOSIS — Z95810 Presence of automatic (implantable) cardiac defibrillator: Secondary | ICD-10-CM | POA: Diagnosis not present

## 2018-08-09 NOTE — Progress Notes (Signed)
2D Echocardiogram has been performed.  Kelly [name] 08/09/2018, 8:34 AM

## 2018-08-22 ENCOUNTER — Encounter (HOSPITAL_COMMUNITY): Payer: No Typology Code available for payment source

## 2018-09-02 ENCOUNTER — Telehealth (HOSPITAL_COMMUNITY): Payer: Self-pay

## 2018-09-02 DIAGNOSIS — I420 Dilated cardiomyopathy: Secondary | ICD-10-CM

## 2018-09-02 NOTE — Telephone Encounter (Signed)
Error

## 2018-09-06 ENCOUNTER — Telehealth (HOSPITAL_COMMUNITY): Payer: Self-pay

## 2018-09-06 NOTE — Telephone Encounter (Signed)
-----   Message from Sherald Hess, NP sent at 09/01/2018  3:53 PM EST ----- EF about the same at 25%. I would like to set her up for CPX test. Please call.

## 2018-09-06 NOTE — Telephone Encounter (Signed)
Pt aware of results, appreciative. Scheduled for CPX tomorrow.

## 2018-09-07 ENCOUNTER — Ambulatory Visit (HOSPITAL_COMMUNITY): Payer: PRIVATE HEALTH INSURANCE | Attending: Internal Medicine

## 2018-09-07 DIAGNOSIS — I428 Other cardiomyopathies: Secondary | ICD-10-CM | POA: Diagnosis not present

## 2018-09-07 DIAGNOSIS — Z95810 Presence of automatic (implantable) cardiac defibrillator: Secondary | ICD-10-CM | POA: Insufficient documentation

## 2018-09-07 DIAGNOSIS — I110 Hypertensive heart disease with heart failure: Secondary | ICD-10-CM | POA: Insufficient documentation

## 2018-09-07 DIAGNOSIS — I420 Dilated cardiomyopathy: Secondary | ICD-10-CM | POA: Diagnosis not present

## 2018-09-07 DIAGNOSIS — I509 Heart failure, unspecified: Secondary | ICD-10-CM | POA: Insufficient documentation

## 2018-09-07 DIAGNOSIS — E119 Type 2 diabetes mellitus without complications: Secondary | ICD-10-CM | POA: Insufficient documentation

## 2018-09-07 DIAGNOSIS — F17210 Nicotine dependence, cigarettes, uncomplicated: Secondary | ICD-10-CM | POA: Insufficient documentation

## 2018-09-13 ENCOUNTER — Telehealth (HOSPITAL_COMMUNITY): Payer: Self-pay

## 2018-09-13 NOTE — Telephone Encounter (Signed)
-----   Message from Sherald Hess, NP sent at 09/12/2018  9:08 AM EST ----- Good news. Normal functional capacity. Minimal cardiac limitations. Continue to try lose weight and increase activity. Please call.

## 2018-09-13 NOTE — Telephone Encounter (Signed)
Spoke with pt about cpx results. Pt grateful for the good news. Pt verbalized understanding.

## 2018-09-28 ENCOUNTER — Encounter (HOSPITAL_COMMUNITY): Payer: No Typology Code available for payment source

## 2018-12-16 IMAGING — CR DG CHEST 2V
2 series · 2 of 2 positions shown · non-contrast
Comparison: Chest radiograph July 12, 2016

CLINICAL DATA: Intermittent LEFT chest pain, nausea, vomiting and
dizziness for 2 days. History of CHF, diabetes.

EXAM:
CHEST  2 VIEW

[w chest pa]
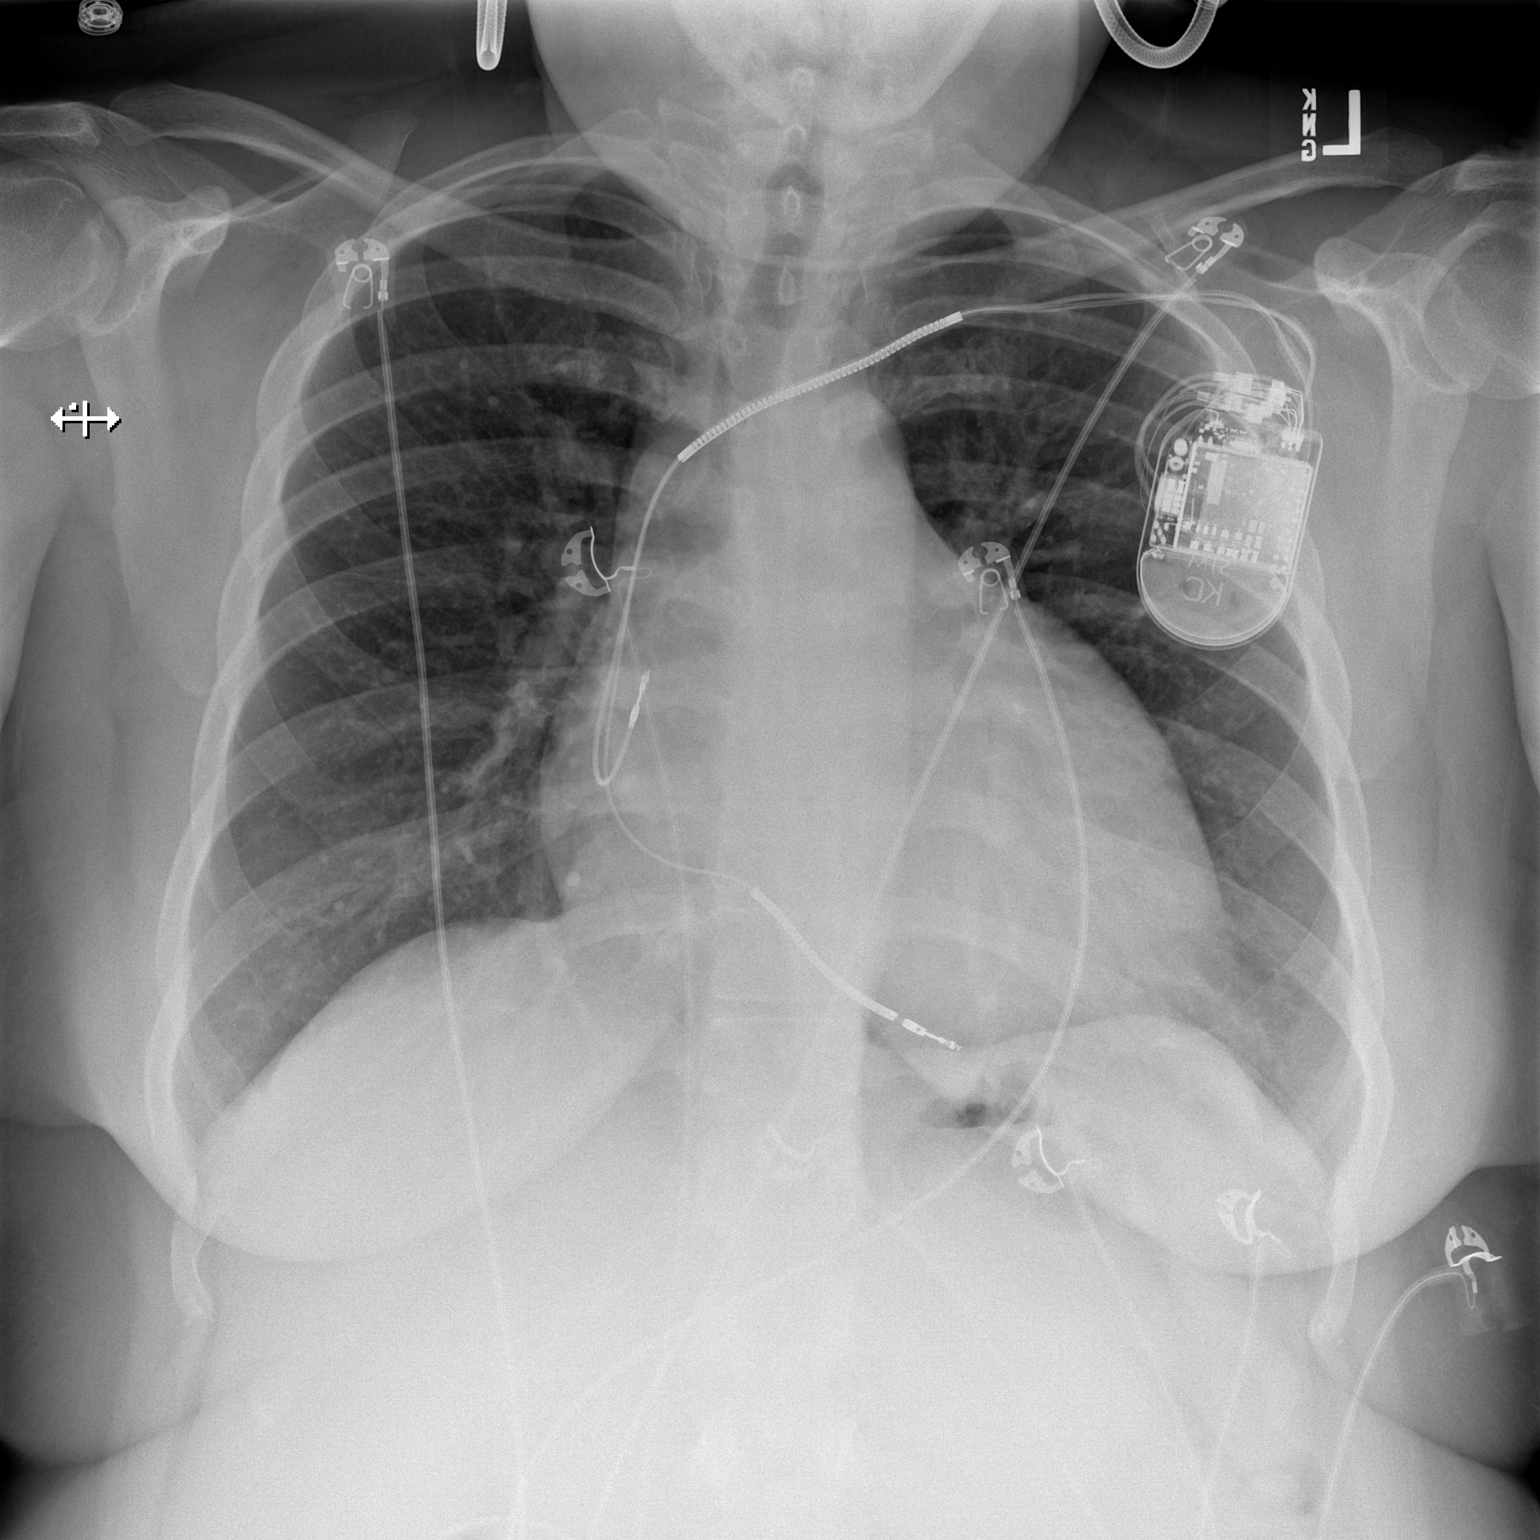

[w chest lat]
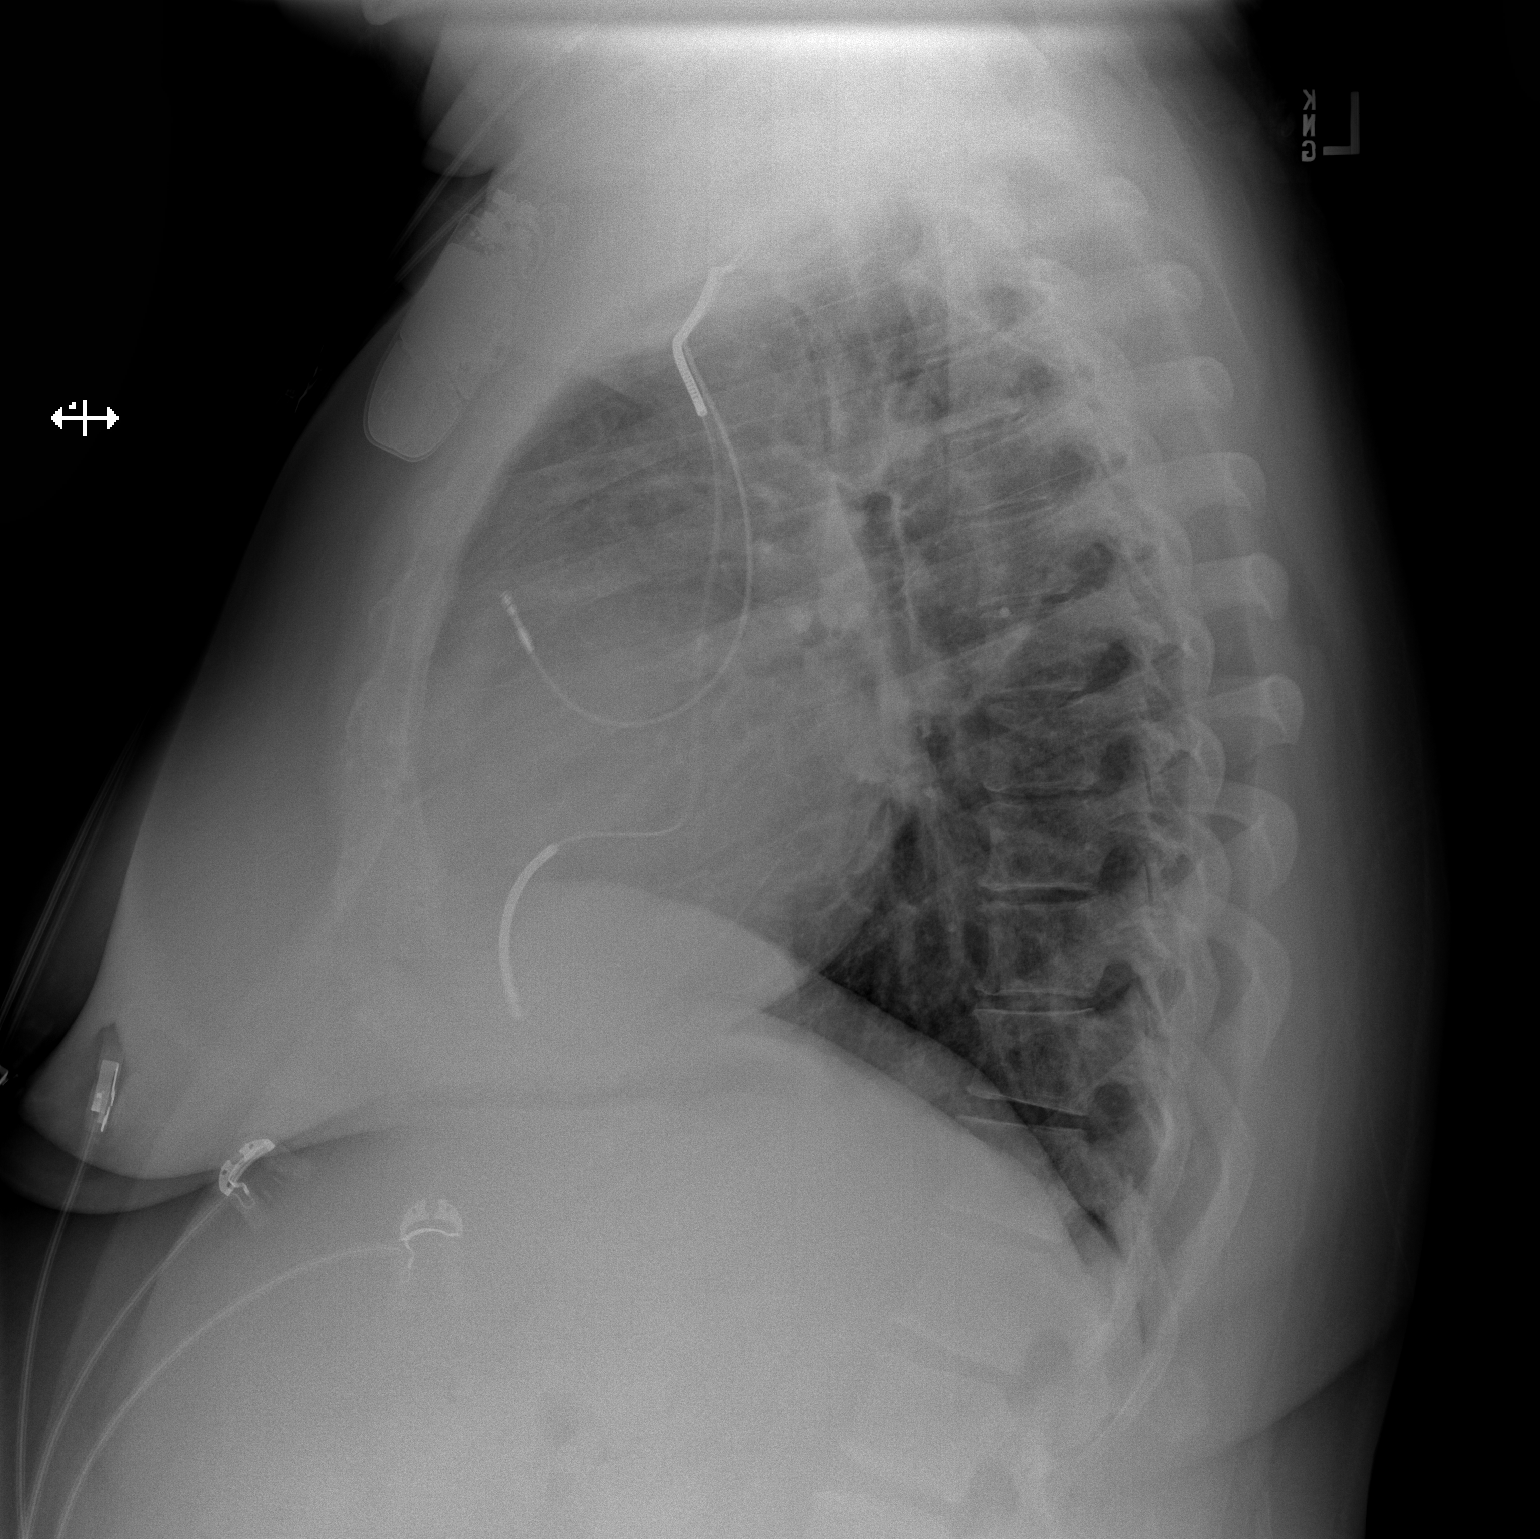

[2 of 2 positions shown; findings below may reference images not displayed]

FINDINGS: Cardiac silhouette is mildly enlarged and unchanged. Dual lead LEFT
cardiac defibrillator in situ. No pleural effusion or focal
consolidation. No pneumothorax. Soft tissue planes and included
osseous structures are normal.
IMPRESSION: Stable examination:  Mild cardiomegaly, no acute pulmonary process.

## 2019-02-18 ENCOUNTER — Emergency Department (HOSPITAL_COMMUNITY)
Admission: EM | Admit: 2019-02-18 | Discharge: 2019-02-18 | Discharge status: Against Medical Advice | Payer: No Typology Code available for payment source | Attending: Emergency Medicine | Admitting: Emergency Medicine

## 2019-02-18 ENCOUNTER — Emergency Department (HOSPITAL_COMMUNITY): Payer: No Typology Code available for payment source

## 2019-02-18 ENCOUNTER — Encounter (HOSPITAL_COMMUNITY): Payer: Self-pay

## 2019-02-18 ENCOUNTER — Other Ambulatory Visit: Payer: Self-pay

## 2019-02-18 DIAGNOSIS — R0789 Other chest pain: Secondary | ICD-10-CM | POA: Diagnosis present

## 2019-02-18 DIAGNOSIS — Z5321 Procedure and treatment not carried out due to patient leaving prior to being seen by health care provider: Secondary | ICD-10-CM | POA: Insufficient documentation

## 2019-02-18 LAB — I-STAT BETA HCG BLOOD, ED (NOT ORDERABLE): I-stat hCG, quantitative: <5 m[IU]/mL (ref <5)

## 2019-02-18 LAB — CBC
HCT: 43.1 % (ref 36.0–46.0)
Hemoglobin: 14 g/dL (ref 12.0–15.0)
MCH: 29.1 pg (ref 26.0–34.0)
MCHC: 32.5 g/dL (ref 30.0–36.0)
MCV: 89.6 fL (ref 80.0–100.0)
Platelets: 270 10*3/uL (ref 150–400)
RBC: 4.81 MIL/uL (ref 3.87–5.11)
RDW: 12.8 % (ref 11.5–15.5)
WBC: 8.5 10*3/uL (ref 4.0–10.5)
nRBC: 0 % (ref 0.0–0.2)

## 2019-02-18 LAB — BASIC METABOLIC PANEL
Anion gap: 10 (ref 5–15)
BUN: 6 mg/dL (ref 6–20)
CO2: 23 mmol/L (ref 22–32)
Calcium: 9.9 mg/dL (ref 8.9–10.3)
Chloride: 103 mmol/L (ref 98–111)
Creatinine, Ser: 0.57 mg/dL (ref 0.44–1.00)
GFR calc Af Amer: >60 mL/min (ref >60)
GFR calc non Af Amer: >60 mL/min (ref >60)
Glucose, Bld: 260 mg/dL — ABNORMAL HIGH (ref 70–99)
Potassium: 4 mmol/L (ref 3.5–5.1)
Sodium: 136 mmol/L (ref 135–145)

## 2019-02-18 LAB — TROPONIN I (HIGH SENSITIVITY): Troponin I (High Sensitivity): 8 ng/L (ref <18)

## 2019-02-18 MED ORDER — SODIUM CHLORIDE 0.9% FLUSH: 3.0000 mL | Freq: Once | INTRAVENOUS | Status: DC

## 2019-02-18 NOTE — ED Notes (Addendum)
No answer for repeat lab draw at this time.

## 2019-02-18 NOTE — ED Triage Notes (Signed)
Pt states central chest pain since yesterday that has been gradually getting worse. Pt states she took nitro at 1130 and 1230 with only minor relief. Pt states that her pain was still too much to sleep with. Pt states she has hx of CHF and has a defibrillator.

## 2019-03-15 ENCOUNTER — Telehealth: Payer: Self-pay | Admitting: Internal Medicine

## 2019-03-15 ENCOUNTER — Ambulatory Visit (INDEPENDENT_AMBULATORY_CARE_PROVIDER_SITE_OTHER): Payer: Self-pay | Admitting: *Deleted

## 2019-03-15 ENCOUNTER — Other Ambulatory Visit: Payer: Self-pay

## 2019-03-15 DIAGNOSIS — I420 Dilated cardiomyopathy: Secondary | ICD-10-CM

## 2019-03-15 LAB — CUP PACEART INCLINIC DEVICE CHECK
Battery Remaining Longevity: 73 mo
Brady Statistic RA Percent Paced: 0 %
Brady Statistic RV Percent Paced: 0 %
Date Time Interrogation Session: 20200826120337
HighPow Impedance: 98.3535
Implantable Lead Implant Date: 20090114
Implantable Lead Implant Date: 20090114
Implantable Lead Location: 753859
Implantable Lead Location: 753860
Implantable Lead Model: 7121
Implantable Pulse Generator Implant Date: 20180215
Lead Channel Impedance Value: 425 Ohm
Lead Channel Impedance Value: 512.5 Ohm
Lead Channel Pacing Threshold Amplitude: 0.5 V
Lead Channel Pacing Threshold Amplitude: 0.5 V
Lead Channel Pacing Threshold Pulse Width: 0.5 ms
Lead Channel Pacing Threshold Pulse Width: 0.5 ms
Lead Channel Sensing Intrinsic Amplitude: 12 mV
Lead Channel Sensing Intrinsic Amplitude: 4.7 mV
Lead Channel Setting Pacing Amplitude: 2 V
Lead Channel Setting Pacing Amplitude: 2.5 V
Lead Channel Setting Pacing Pulse Width: 0.5 ms
Lead Channel Setting Sensing Sensitivity: 0.5 mV
Pulse Gen Serial Number: 7337343

## 2019-03-15 NOTE — Progress Notes (Signed)
ICD check in clinic. Normal device function. Thresholds and sensing consistent with previous device measurements. Patient notifier delivered for high HVLI, impedance measured 98 in office, impedance trends stable over time. 9 SVT episodes, 3 NST episodes. Histogram distribution appropriate for patient and level of activity. Device programmed at appropriate safety margins. Device programmed to optimize intrinsic conduction. Pt went into PMT during atrial threshold testing, V sensing test used to break PMT. Rate responsive PVARP turned off, max sensor rate & max track rate decreased to 125, rate responsive AV delay programmed to low, and PMT detection rate programed to 125 per industry. Estimated longevity 5.9 years. Plan to check device every 3 months remotely and in office annually. Patient education completed including shock plan

## 2019-03-15 NOTE — Telephone Encounter (Signed)
New Message    1. Has your device fired? Yes   2. Is you device beeping? NO, it's vibrating   3. Are you experiencing draining or swelling at device site? NO  4. Are you calling to see if we received your device transmission?NO  5. Have you passed out? NO    Please route to [place]

## 2019-04-05 ENCOUNTER — Encounter: Payer: No Typology Code available for payment source | Admitting: Nurse Practitioner
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
## 2019-04-17 ENCOUNTER — Encounter: Payer: No Typology Code available for payment source | Admitting: Nurse Practitioner

## 2019-05-29 ENCOUNTER — Ambulatory Visit (INDEPENDENT_AMBULATORY_CARE_PROVIDER_SITE_OTHER): Payer: No Typology Code available for payment source | Admitting: Student

## 2019-05-29 ENCOUNTER — Encounter: Payer: Self-pay | Admitting: *Deleted

## 2019-05-29 ENCOUNTER — Other Ambulatory Visit: Payer: Self-pay

## 2019-05-29 VITALS — BP 134/98 | Ht 65.0 in | Wt 221.0 lb

## 2019-05-29 DIAGNOSIS — I420 Dilated cardiomyopathy: Secondary | ICD-10-CM

## 2019-05-29 DIAGNOSIS — I10 Essential (primary) hypertension: Secondary | ICD-10-CM | POA: Diagnosis not present

## 2019-05-29 DIAGNOSIS — I493 Ventricular premature depolarization: Secondary | ICD-10-CM

## 2019-05-29 LAB — CUP PACEART INCLINIC DEVICE CHECK
Battery Remaining Longevity: 72 mo
Brady Statistic RA Percent Paced: 0 %
Brady Statistic RV Percent Paced: 0.02 %
Date Time Interrogation Session: 20201109113010
HighPow Impedance: 90.7388
Implantable Lead Implant Date: 20090114
Implantable Lead Implant Date: 20090114
Implantable Lead Location: 753859
Implantable Lead Location: 753860
Implantable Lead Model: 7121
Implantable Pulse Generator Implant Date: 20180215
Lead Channel Impedance Value: 412.5 Ohm
Lead Channel Impedance Value: 512.5 Ohm
Lead Channel Pacing Threshold Amplitude: 0.75 V
Lead Channel Pacing Threshold Amplitude: 0.75 V
Lead Channel Pacing Threshold Amplitude: 0.75 V
Lead Channel Pacing Threshold Amplitude: 0.75 V
Lead Channel Pacing Threshold Pulse Width: 0.5 ms
Lead Channel Pacing Threshold Pulse Width: 0.5 ms
Lead Channel Pacing Threshold Pulse Width: 0.5 ms
Lead Channel Pacing Threshold Pulse Width: 0.5 ms
Lead Channel Sensing Intrinsic Amplitude: 12 mV
Lead Channel Sensing Intrinsic Amplitude: 4.3 mV
Lead Channel Setting Pacing Amplitude: 2 V
Lead Channel Setting Pacing Amplitude: 2.5 V
Lead Channel Setting Pacing Pulse Width: 0.5 ms
Lead Channel Setting Sensing Sensitivity: 0.5 mV
Pulse Gen Serial Number: 7337343

## 2019-05-29 MED ORDER — CARVEDILOL 25 MG PO TABS: 25.0000 mg | ORAL_TABLET | Freq: Two times a day (BID) | ORAL | 3 refills | Status: AC

## 2019-05-29 NOTE — Patient Instructions (Addendum)
Medication Instructions:   START TAKING: CARVEDILOL 25 MG TWICE A DAY   *If you need a refill on your cardiac medications before your next appointment, please call your pharmacy*  Lab Work:  BMET TODAY    If you have labs (blood work) drawn today and your tests are completely normal, you will receive your results only by: Marland Kitchen MyChart Message (if you have MyChart) OR . A paper copy in the mail If you have any lab test that is abnormal or we need to change your treatment, we will call you to review the results.  Testing/Procedures: NONE ORDERED  TODAY    Follow-Up: At [facility], you and your health needs are our priority.  As part of our continuing mission to provide you with exceptional heart care, we have created designated Provider Care Teams.  These Care Teams include your primary Cardiologist (physician) and Advanced Practice Providers (APPs -  Physician Assistants and Nurse Practitioners) who all work together to provide you with the care you need, when you need it.  Your next appointment:   12 months  The format for your next appointment:   In Person  Provider:   You may see  Dr. Rayann Heman or one of the following Advanced Practice Providers on your designated Care Team:    Chanetta Marshall, NP  Tommye Standard, PA-C  Legrand Como "Oda [place], [place]   Other Instructions
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
## 2019-05-30 LAB — BASIC METABOLIC PANEL
BUN/Creatinine Ratio: 9 (ref 9–23)
BUN: 6 mg/dL (ref 6–20)
CO2: 22 mmol/L (ref 20–29)
Calcium: 9.5 mg/dL (ref 8.7–10.2)
Chloride: 100 mmol/L (ref 96–106)
Creatinine, Ser: 0.67 mg/dL (ref 0.57–1.00)
GFR calc Af Amer: 132 mL/min/{1.73_m2} (ref >59)
GFR calc non Af Amer: 114 mL/min/{1.73_m2} (ref >59)
Glucose: 190 mg/dL — ABNORMAL HIGH (ref 65–99)
Potassium: 4.5 mmol/L (ref 3.5–5.2)
Sodium: 137 mmol/L (ref 134–144)

## 2019-10-25 ENCOUNTER — Telehealth: Payer: Self-pay | Admitting: Internal Medicine

## 2019-10-25 NOTE — Telephone Encounter (Signed)
New Message  Pt c/o of Chest Pain: STAT if CP now or developed within 24 hours  1. Are you having CP right now?   2. Are you experiencing any other symptoms (ex. SOB, nausea, vomiting, sweating)? SOB, nausea, vomit, light-headedness and dizziness  3. How long have you been experiencing CP? About a week and a half  4. Is your CP continuous or coming and going? Coming and going  5. Have you taken Nitroglycerin? No  ?

## 2019-10-25 NOTE — Telephone Encounter (Signed)
I spoke to the patient who called because she has been experiencing intermittent CP and SOB the past couple of weeks.  She has had episodes of nausea, swelling and some dizziness.  There have been moments where it feels like her heart is "racing".    I told her that we would reach out to her and get her scheduled early next week, but if things worsen, she should go to the ED.  She verbalized understanding.
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
## 2019-10-30 ENCOUNTER — Encounter: Payer: Self-pay | Admitting: Student

## 2019-12-19 ENCOUNTER — Emergency Department (HOSPITAL_COMMUNITY): Payer: Self-pay

## 2019-12-19 ENCOUNTER — Encounter (HOSPITAL_COMMUNITY): Payer: Self-pay

## 2019-12-19 ENCOUNTER — Emergency Department (HOSPITAL_COMMUNITY)
Admission: EM | Admit: 2019-12-19 | Discharge: 2019-12-19 | Disposition: A | Discharge status: Home / Self Care | Payer: Self-pay | Attending: Emergency Medicine | Admitting: Emergency Medicine

## 2019-12-19 DIAGNOSIS — Z95810 Presence of automatic (implantable) cardiac defibrillator: Secondary | ICD-10-CM | POA: Insufficient documentation

## 2019-12-19 DIAGNOSIS — N83202 Unspecified ovarian cyst, left side: Secondary | ICD-10-CM | POA: Insufficient documentation

## 2019-12-19 DIAGNOSIS — R1032 Left lower quadrant pain: Secondary | ICD-10-CM | POA: Insufficient documentation

## 2019-12-19 DIAGNOSIS — E119 Type 2 diabetes mellitus without complications: Secondary | ICD-10-CM | POA: Insufficient documentation

## 2019-12-19 DIAGNOSIS — I509 Heart failure, unspecified: Secondary | ICD-10-CM | POA: Insufficient documentation

## 2019-12-19 DIAGNOSIS — R102 Pelvic and perineal pain: Secondary | ICD-10-CM | POA: Insufficient documentation

## 2019-12-19 DIAGNOSIS — I110 Hypertensive heart disease with heart failure: Secondary | ICD-10-CM | POA: Insufficient documentation

## 2019-12-19 DIAGNOSIS — Z91040 Latex allergy status: Secondary | ICD-10-CM | POA: Insufficient documentation

## 2019-12-19 DIAGNOSIS — Z79899 Other long term (current) drug therapy: Secondary | ICD-10-CM | POA: Insufficient documentation

## 2019-12-19 DIAGNOSIS — F17210 Nicotine dependence, cigarettes, uncomplicated: Secondary | ICD-10-CM | POA: Insufficient documentation

## 2019-12-19 DIAGNOSIS — Z7984 Long term (current) use of oral hypoglycemic drugs: Secondary | ICD-10-CM | POA: Insufficient documentation

## 2019-12-19 DIAGNOSIS — N83209 Unspecified ovarian cyst, unspecified side: Secondary | ICD-10-CM

## 2019-12-19 LAB — BASIC METABOLIC PANEL
Anion gap: 9 (ref 5–15)
BUN: 8 mg/dL (ref 6–20)
CO2: 27 mmol/L (ref 22–32)
Calcium: 9.1 mg/dL (ref 8.9–10.3)
Chloride: 103 mmol/L (ref 98–111)
Creatinine, Ser: 0.58 mg/dL (ref 0.44–1.00)
GFR calc Af Amer: >60 mL/min (ref >60)
GFR calc non Af Amer: >60 mL/min (ref >60)
Glucose, Bld: 233 mg/dL — ABNORMAL HIGH (ref 70–99)
Potassium: 3.7 mmol/L (ref 3.5–5.1)
Sodium: 139 mmol/L (ref 135–145)

## 2019-12-19 LAB — CBC WITH DIFFERENTIAL/PLATELET
Abs Immature Granulocytes: 0.02 10*3/uL (ref 0.00–0.07)
Basophils Absolute: 0.1 10*3/uL (ref 0.0–0.1)
Basophils Relative: 1 %
Eosinophils Absolute: 0.3 10*3/uL (ref 0.0–0.5)
Eosinophils Relative: 3 %
HCT: 38.3 % (ref 36.0–46.0)
Hemoglobin: 12.9 g/dL (ref 12.0–15.0)
Immature Granulocytes: 0 %
Lymphocytes Relative: 28 %
Lymphs Abs: 2.9 10*3/uL (ref 0.7–4.0)
MCH: 29.8 pg (ref 26.0–34.0)
MCHC: 33.7 g/dL (ref 30.0–36.0)
MCV: 88.5 fL (ref 80.0–100.0)
Monocytes Absolute: 0.9 10*3/uL (ref 0.1–1.0)
Monocytes Relative: 9 %
Neutro Abs: 6.1 10*3/uL (ref 1.7–7.7)
Neutrophils Relative %: 59 %
Platelets: 274 10*3/uL (ref 150–400)
RBC: 4.33 MIL/uL (ref 3.87–5.11)
RDW: 12.7 % (ref 11.5–15.5)
WBC: 10.3 10*3/uL (ref 4.0–10.5)
nRBC: 0 % (ref 0.0–0.2)

## 2019-12-19 LAB — HCG, QUANTITATIVE, PREGNANCY: hCG, Beta Chain, Quant, S: <1 m[IU]/mL (ref <5)

## 2019-12-19 MED ORDER — NAPROXEN 500 MG PO TABS: 500.0000 mg | ORAL_TABLET | Freq: Two times a day (BID) | ORAL | 0 refills | Status: AC

## 2019-12-19 MED ORDER — MORPHINE SULFATE (PF) 4 MG/ML IV SOLN
4.0000 mg | Freq: Once | INTRAVENOUS | Status: AC
  Administered 2019-12-19: 4 mg via INTRAVENOUS
  Filled 2019-12-19: qty 1

## 2019-12-19 MED ORDER — SODIUM CHLORIDE 0.9 % IV BOLUS
1000.0000 mL | Freq: Once | INTRAVENOUS | Status: AC
  Administered 2019-12-19: 1000 mL via INTRAVENOUS

## 2019-12-19 MED ORDER — ONDANSETRON 4 MG PO TBDP: 4.0000 mg | ORAL_TABLET | ORAL | 0 refills | Status: AC | PRN

## 2019-12-19 MED ORDER — ONDANSETRON HCL 4 MG/2ML IJ SOLN
4.0000 mg | Freq: Once | INTRAMUSCULAR | Status: AC
  Administered 2019-12-19: 4 mg via INTRAVENOUS
  Filled 2019-12-19: qty 2

## 2019-12-19 NOTE — Discharge Instructions (Addendum)
You were seen today for left lower quadrant pain.  You have a simple ovarian cyst.  This may be physiologic in nature.  There is no evidence of your ovary turning on itself.  Take naproxen for pain.  Follow-up with your primary physician.
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
## 2019-12-19 NOTE — ED Notes (Signed)
Patient transported to CT 

## 2019-12-19 NOTE — ED Triage Notes (Addendum)
Pt reports LL groin pain that has worsened progressively over the last week. Reports episodes of vomiting yesterday. Endorses constipation. LBM 4 days ago. States that she is starting to feel dizzy. Denies dysuria. A&Ox4. Ambulatory,

## 2020-01-04 DIAGNOSIS — H0100A Unspecified blepharitis right eye, upper and lower eyelids: Secondary | ICD-10-CM

## 2020-01-05 ENCOUNTER — Inpatient Hospital Stay
Admit: 2020-01-05 | Discharge: 2020-01-05 | Disposition: A | Discharge status: Home / Self Care | Attending: Emergency Medicine

## 2020-01-05 MED ORDER — CEPHALEXIN 500 MG CAP
500 mg | ORAL_CAPSULE | Freq: Four times a day (QID) | ORAL | 0 refills | Status: AC
Start: 2020-01-05 — End: 2020-01-12

## 2020-01-05 MED ORDER — ERYTHROMYCIN 5 MG/G EYE OINTMENT
5 mg/gram (0. %) | OPHTHALMIC | 0 refills | Status: AC
Start: 2020-01-05 — End: ?

## 2020-01-05 MED ORDER — CEPHALEXIN 250 MG CAP
250 mg | ORAL | Status: AC
Start: 2020-01-05 — End: 2020-01-05
  Administered 2020-01-05: 08:00:00 via ORAL

## 2020-01-05 MED FILL — CEPHALEXIN 250 MG CAP: 250 mg | ORAL | Qty: 2

## 2020-01-05 NOTE — ED Provider Notes (Signed)
EMERGENCY DEPARTMENT HISTORY AND PHYSICAL EXAM    3:27 AM    Date: 01/05/2020  Patient Name: [name]    History of Presenting Illness     Chief Complaint   Patient presents with   ??? Headache   ??? Vision Change   ??? Eye Swelling       History Provided By: Patient  Location/Duration/Severity/Modifying factors   Patient is a 34 year old female present with bilateral eye itching and drainage going on for the past 2 days.  Initially started with itching and small amount of drainage then progressed to increase bilateral upper and lower lid lid swelling more prominent on the upper side left is worse than right.  Complains of a dull headache moderate in intensity located posterior to both eyes.  No pain with extraocular movements.  No fevers or chills no upper respiratory symptoms.  She tried warm compress without relief. Reports that she does have some blurred vision, occasionally improves with clearance of the tears. She feels like both of her upper eyelids are heavy. She denies any history of thromboembolic disease. There is no double vision.          PCP: None    Current Outpatient Medications   Medication Sig Dispense Refill   ??? erythromycin (ILOTYCIN) ophthalmic ointment Apply thin film to both eyes on the upper and the lower lid 4 times a day for a total of 7 days. 3.5 g 0   ??? cephALEXin (Keflex) 500 mg capsule Take 1 Capsule by mouth four (4) times daily for 7 days. 28 Capsule 0   ??? HYDROcodone-acetaminophen (NORCO) 5-325 mg per tablet Take 1 tablet by mouth every four (4) hours as needed for Pain. (Patient not taking: Reported on 01/05/2020) 20 tablet 0   ??? metFORMIN (GLUCOPHAGE) 500 mg tablet Take 500 mg by mouth two (2) times daily (with meals). Indications: TYPE 2 DIABETES MELLITUS (Patient not taking: Reported on 01/05/2020)     ??? eplerenone (INSPRA) 25 mg tablet Take 25 mg by mouth daily. Indications: CHRONIC HEART FAILURE FOLLOWING MYOCARDIAL INFARCTION (Patient not taking: Reported on 01/05/2020)      ??? carvedilol (COREG) 6.25 mg tablet Take 6.25 mg by mouth two (2) times daily (with meals). Indications: CHRONIC HEART FAILURE (Patient not taking: Reported on 01/05/2020)     ??? lisinopril (PRINIVIL, ZESTRIL) 10 mg tablet Take 10 mg by mouth daily. Indications: HYPERTENSION (Patient not taking: Reported on 01/05/2020)     ??? nitroglycerin (NITROSTAT) 0.4 mg SL tablet 0.4 mg by SubLINGual route every five (5) minutes as needed for Chest Pain. Indications: ANGINA (Patient not taking: Reported on 01/05/2020)     ??? aspirin 81 mg chewable tablet Take 81 mg by mouth daily. (Patient not taking: Reported on 01/05/2020)     ??? omega-3 fatty acids-vitamin e (FISH OIL) 1,000 mg cap Take 1 Cap by mouth. (Patient not taking: Reported on 01/05/2020)         Past History     Past Medical History:  Past Medical History:   Diagnosis Date   ??? Diabetes (HCC)     NIDDM   ??? Heart failure (HCC)     EF 15% (06/2013)   ??? Hypertension    ??? Obesity        Past Surgical History:  Past Surgical History:   Procedure Laterality Date   ??? HX PACEMAKER      2009 (AICD/PACER) [employer]       Family History:  No family  history on file.    Social History:  Social History     Tobacco Use   ??? Smoking status: Light Tobacco Smoker     Packs/day: 0.25   Substance Use Topics   ??? Alcohol use: No   ??? Drug use: No       Allergies:  Allergies   Allergen Reactions   ??? Latex Hives   ??? Aldactone [Spironolactone] Vertigo       I reviewed and confirmed the above information with patient and updated as necessary.    Review of Systems     Review of Systems   Constitutional: Negative for chills and fever.   HENT: Negative for congestion, rhinorrhea, sinus pressure and sneezing.    Eyes: Positive for discharge, itching and visual disturbance. Negative for photophobia.   Respiratory: Negative for cough and shortness of breath.    Cardiovascular: Negative for chest pain.   Gastrointestinal: Negative for abdominal pain, diarrhea, nausea and vomiting.   Genitourinary: Negative  for dysuria, frequency and urgency.   Musculoskeletal: Negative for back pain and neck pain.   Skin: Negative for rash.   Neurological: Negative for syncope, numbness and headaches.       Physical Exam     Visit Vitals  BP (!) 147/71 (BP 1 Location: Left upper arm)   Pulse 77   Temp 98.1 ??F (36.7 ??C)   Resp 16   Ht 5\' 5"  (1.651 m)   Wt 96.2 kg (212 lb)   LMP 01/01/2020 (Exact Date)   SpO2 99%   BMI 35.28 kg/m??       Physical Exam  Vitals and nursing note reviewed.   Constitutional:       General: She is not in acute distress.     Appearance: Normal appearance. She is normal weight.   HENT:      Head: Normocephalic and atraumatic.      Right Ear: External ear normal.      Left Ear: External ear normal.      Nose: Nose normal.      Mouth/Throat:      Mouth: Mucous membranes are moist.   Eyes:      Pupils: Pupils are equal, round, and reactive to light.      Comments: White to yellow appearing discharge coming from both eyes. There is some mild crusting of the lid margins. Both upper lids are swollen, the left lower lid is slightly more swollen than the right. Extraocular movements are preserved and intact without significant eye pain. There is mild conjunctival injection. There is no proptosis, there is no erythema surrounding the eyes.   Neck:      Comments: No meningismus.  Cardiovascular:      Rate and Rhythm: Normal rate and regular rhythm.      Pulses: Normal pulses.      Heart sounds: Normal heart sounds. No murmur heard.     Pulmonary:      Effort: Pulmonary effort is normal.      Breath sounds: Normal breath sounds. No wheezing, rhonchi or rales.   Abdominal:      General: Abdomen is flat.      Tenderness: There is no abdominal tenderness. There is no guarding or rebound.   Musculoskeletal:         General: No swelling or tenderness. Normal range of motion.      Cervical back: Normal range of motion and neck supple.      Right lower leg: No edema.  Left lower leg: No edema.   Skin:     General: Skin is  warm and dry.      Capillary Refill: Capillary refill takes less than 2 seconds.      Findings: No rash.   Neurological:      General: No focal deficit present.      Mental Status: She is alert.      Comments: Oriented x4. Full strength in the upper and lower extremities, normal cranial nerve exam.         Diagnostic Study Results     Labs -  No results found for this or any previous visit (from the past 24 hour(s)).      Radiologic Studies -   No orders to display           Medical Decision Making   I am the first provider for this patient.    I reviewed the vital signs, available nursing notes, past medical history, past surgical history, family history and social history.    Vital Signs-Reviewed the patient's vital signs.      Records Reviewed: Nursing Notes, Old Medical Records, Previous Radiology Studies and Previous Laboratory Studies (Time of Review: 3:27 AM)    ED Course: Progress Notes, Reevaluation, and Consults:         Provider Notes (Medical Decision Making):   MDM  Number of Diagnoses or Management Options  Blepharitis of upper and lower eyelids of both eyes, unspecified type  Diagnosis management comments: Patient presents with swelling of both eyelids, worse on the upper eyelids going on for the past couple of days. The exam is consistent with blepharitis. She has mild conjunctival injection. She has no proptosis or suggestion that there would be a cavernous sinus or dural sinus thrombosis. Less likely conjunctivitis but may be possible secondary bacterial infection at this point. I don't appreciate any foreign bodies. She denies getting anything in the eyes.  Headache is benign there is no indication of meningitis or subarachnoid hemorrhage. There is no concerning features for orbital cellulitis, she may have a slight degree of preseptal cellulitis.    Plan will be to have the patient continue doing her warm compresses at home she has been doing all add on antibiotics and drops. I recommended that  she not seeing improvement in the next 24-48 hours to some degree she should return and have it reassessed at that time. Otherwise recommended she follow-up with her primary care doctor and ophthalmology. Advised to return in the meantime if worsening abruptly or changing in any way.    At this time, patient is stable and appropriate for discharge home. ??Patient demonstrates understanding of current diagnoses and is in agreement with the treatment plan. ??They are advised that while the likelihood of serious underlying condition is low at this point given the evaluation performed today, we cannot fully rule it out. ??They are advised to immediately return with any new symptoms or worsening of current condition. ??All questions have been answered. ??Patient is given educational material regarding their diagnoses, including danger symptoms and when to return to the ED.     This note was dictated utilizing Systems analyst. Unfortunately this leads to occasional typographical errors. I apologize in advance if the situation occurs. If questions occur please do not hesitate to contact me directly.    Luetta Nutting, DO          Procedures    Critical Care Time: 0    Diagnosis  Clinical Impression:   1. Blepharitis of upper and lower eyelids of both eyes, unspecified type        Disposition: Discharge    Follow-up Information     Follow up With Specialties Details Why Contact Info    Your Primary Care Physician  In 3 days      [facility]   Primary Care Resource [address]  [address]  [place] [address]  [phone]    [place] [facility]  In 3 days Primary Care Resource [address] [address]  [phone]    Thomes Dinning, MD Internal Medicine In 3 days PCP resource [address]  [address]  [place] [address]  [phone]             Discharge Medication List as of 01/05/2020  3:30 AM      START  taking these medications    Details   erythromycin (ILOTYCIN) ophthalmic ointment Apply thin film to both eyes on the upper and the lower lid 4 times a day for a total of 7 days., Print, Disp-3.5 g, R-0      cephALEXin (Keflex) 500 mg capsule Take 1 Capsule by mouth four (4) times daily for 7 days., Print, Disp-28 Capsule, R-0         CONTINUE these medications which have NOT CHANGED    Details   HYDROcodone-acetaminophen (NORCO) 5-325 mg per tablet Take 1 tablet by mouth every four (4) hours as needed for Pain., Print, Disp-20 tablet, R-0      metFORMIN (GLUCOPHAGE) 500 mg tablet Take 500 mg by mouth two (2) times daily (with meals). Indications: TYPE 2 DIABETES MELLITUS, Historical Med      eplerenone (INSPRA) 25 mg tablet Take 25 mg by mouth daily. Indications: CHRONIC HEART FAILURE FOLLOWING MYOCARDIAL INFARCTION, Historical Med      carvedilol (COREG) 6.25 mg tablet Take 6.25 mg by mouth two (2) times daily (with meals). Indications: CHRONIC HEART FAILURE, Historical Med      lisinopril (PRINIVIL, ZESTRIL) 10 mg tablet Take 10 mg by mouth daily. Indications: HYPERTENSION, Historical Med      nitroglycerin (NITROSTAT) 0.4 mg SL tablet 0.4 mg by SubLINGual route every five (5) minutes as needed for Chest Pain. Indications: ANGINA, Historical Med      aspirin 81 mg chewable tablet Take 81 mg by mouth daily., Historical Med      omega-3 fatty acids-vitamin e (FISH OIL) 1,000 mg cap Take 1 Cap by mouth., Historical Med             Tomasita Crumble DO   Emergency Medicine   January 07, 2020, 3:27 AM     This note is dictated utilizing Dragon voice recognition software. Unfortunately this leads to occasional typographical errors using the voice recognition. I apologize in advance if the situation occurs. If questions occur please do not hesitate to contact me directly.    Tomasita Crumble, DO

## 2020-01-05 NOTE — ED Notes (Signed)
Patient presents to ER for C/O eye swelling that began on Tuesday, headache and eye pain, vision changes that began yesterday.

## 2020-01-05 NOTE — ED Notes (Signed)
Patient bilateral eyelids swollen- patient states painful like she is being hit in the eye when she blinks- patient has constant tearing.

## 2020-05-24 LAB — HEMOGLOBIN A1C
Estimated Avg Glucose, External: 193 mg/dL — ABNORMAL HIGH (ref 91–123)
Hemoglobin A1C, External: 8.4 % — ABNORMAL HIGH (ref 4.8–5.6)

## 2020-07-06 NOTE — ED Provider Notes (Signed)
 [facility]ERGENC[facility] DEPART[facility]    Time of Arrival:   07/06/20 0506      (I50.9) Acute congestive heart failure, unspecified heart failure type (HCC)    (Z20.822) Suspected COVID-19 virus infection    (B34.9) Viral illness    Assessment/Differential Diagnosis:    34 year old female with PMH of viral induced CHF presenting with shortness of breath in setting of constellation of symptoms consistent with viral process.  Slightly tachypneic, slightly tachycardic, normotensive, afebrile.  Slightly rhonchorous breath sounds bilaterally.  Slight increased work of breathing on exam but nontoxic-appearing.    Differential diagnosis includes: CHF exacerbation, Covid, influenza, other viral illness, anemia, electrolyte abnormality, pneumothorax, pneumonia, cardiac tamponade, ACS, myocarditis, pericarditis, PE, others.    ED Course/Medical Decision Making:    EKG nonischemic, borderline QT, similar to priors.  Chest x-ray without acute promises, per my read.  CBC with slight leukocytosis and neutrophilic predominance.  Possibly reactive versus representative of infection.  Hyperglycemic on BMP, otherwise reassuring.  Age-adjusted D-dimer negative.  Doubt PE.  Troponin negative.  Symptoms have been present for several days, will not second set.  BNP elevated to 206, which is up from 67 on 12/15.  Bedside ultrasound demonstrates moderately depressed ejection fraction concerning for heart failure exacerbation.    Patient was previously taking 20 mg Lasix daily.  She ambulated with even and steady gait with minimal dyspnea on exertion.  Oxygen saturation remained 99% on room air.  Tachycardia resolved with heart rate in the 80s.    Is reasonable to treat patient as outpatient at this time.  Provided prescription for Lasix and instructed patient to take it daily for at least 7 days and then to continue if she is still feeling short of breath.  Patient assures me she will call Dr. Clora Dane on Monday.  Also advised PCP  follow-up.  Provided prescription for Zofran in case of nausea.  Advised Tylenol and ibuprofen for fevers and myalgias.  Provided specific return precautions.  Patient verbalized understanding and is in agreement with plan.  I have addressed all of her questions to the best of my ability prior to discharge.    Pre-Hospital/Procedures/Consults/Critical Care:     Aaron Aas    Disposition:  Home      Discharge Medication List as of 07/06/2020  7:23 AM      START taking these medications    Details   furosemide (LASIX) 20 mg PO TABS Take 1 Tab by Mouth Once a Day., Disp-30 Tab, R-0, Print      ondansetron (ZOFRAN) 4 mg PO TbDL Take 1 Tab by Mouth Every 8 Hours., Disp-10 Tab, R-0, Print           Chief Complaint   Patient presents with   . SHORTNESS OF BREATH       HPI   Patient is a 34 year old female with past medical history of CHF, diabetes, hypertension presenting with a chief concern of "I am short of breath".  Patient reports that for the past days she has experienced progressively worsening shortness of breath.  It is associated with fevers, chills, rhinorrhea, sore throat, nausea, vomiting.  Patient works as a Engineer, civil (consulting) and was recently exposed to [employer].  She is fully vaccinated.  Patient follows with Dr. Nohemi Batters of cardiology and was recently taken off of Lasix as her heart failure had resolved.  Patient has been treated twice for heart failure and notes that both times she developed heart failure after having viral infections.  She  is not vaccinated for influenza.  Of note, patient came to emergency department on 12/15 but left without being seen.  Labs were drawn from the waiting room at that time.    Social history: Former tobacco user, denies alcohol use, smokes marijuana occasionally, denies other drug use.    Review of Systems:  Constitutional: Positive for fever and chills.   HENT: Positive for congestion and sore throat. Negative for sinus pressure.    Eyes: Negative for visual disturbance.   Respiratory:  Positive for shortness of breath. Negative for cough.    Cardiovascular: Negative for chest pain and leg swelling.   Gastrointestinal: Positive for nausea and vomiting. Negative for abdominal pain, diarrhea, constipation and blood in stool.   Genitourinary: Negative for dysuria and hematuria.   Musculoskeletal: Negative for myalgias and arthralgias.   Skin: Negative for rash.   Neurological: Positive for light-headedness. Negative for dizziness and headaches.       Physical Exam  Vitals and nursing note reviewed.   Constitutional:       General: She is not in acute distress.     Appearance: She is obese. She is not diaphoretic.      Comments: Appears stated age, resting comfortably on stretcher.   HENT:      Head: Normocephalic and atraumatic.      Mouth/Throat:      Comments: Wearing mask.  Eyes:      General: No scleral icterus.  Cardiovascular:      Rate and Rhythm: Normal rate and regular rhythm.      Heart sounds: Normal heart sounds.   Pulmonary:      Comments: Mildly tachypneic with slight accessory muscle use.  Able to speak in full sentences.  Slightly rhonchorous breath sounds bilaterally.  Abdominal:      General: Abdomen is flat.      Palpations: Abdomen is soft.      Tenderness: There is no abdominal tenderness. There is no guarding or rebound.   Musculoskeletal:         General: No deformity.      Cervical back: Neck supple.      Right lower leg: No edema.      Left lower leg: No edema.   Skin:     General: Skin is warm and dry.      Capillary Refill: Capillary refill takes less than 2 seconds.   Neurological:      Mental Status: She is alert.   Psychiatric:         Mood and Affect: Mood normal.         Behavior: Behavior normal.         Past Medical History:   Diagnosis Date   . Adverse effect of anesthesia 2009    per note in Care Everywhere, pt reports awareness during AICD implantation   . Cardiomyopathy (HCC)    . Congestive heart failure, unspecified    . Diabetes mellitus (HCC)     type II- no  longer taking meds   . Exercise tolerance finding 05/24/2020    denies cp/ sob with climbing stairs   . Gout, unspecified    . Hypertension    . Hypertriglyceridemia    . 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 IUFD at less than 20 weeks of gestation 04/2020   . Morbidly obese (HCC)    . Nonischemic cardiomyopathy (HCC)    .  PCOS (polycystic ovarian syndrome)     pt denies   . Syncope      Past Surgical History:   Procedure Laterality Date   . AICD IMPLANTATION  07/21/2007   . CARDIAC ABLATION  04/06/2017   . DILATION & EVACUATION N/A 05/28/2020    Procedure: DILATION AND EVACUATION, UTERUS;  Surgeon: Gail Joseph, MD   . HEART CATHETERIZATION  2005   . IMPLANTED DEFIBRILLATOR  09/03/2016    GENERATOR CHANGE--2357-40C Fortify [address] DR; serial # [id number]     Family History   Problem Relation Age of Onset   . Milk Intolerance Mother    . Vision Problems Mother    . Migraines Mother    . Heart Disease Other    . Hypertension Other    . Diabetes Other    . Stroke Other      Social History     Occupational History   . Not on file   Tobacco Use   . Smoking status: Former Smoker     Packs/day: 0.50     Years: 4.00     Pack years: 2.00     Types: Cigarettes     Quit date: 06/17/2013     Years since quitting: 7.0   . Smokeless tobacco: Never Used   Substance and Sexual Activity   . Alcohol use: Not Currently     Alcohol/week: 0.0 standard drinks     Comment: rarely   . Drug use: Yes     Comment: occ marijuana   . Sexual activity: Not Currently     No outpatient medications have been marked as taking for the 07/06/20 encounter [facility] Encounter).     Allergies   Allergen Reactions   . Aldactone [Spironolactone] hives   . Latex rash/itching       Vital Signs:  Patient Vitals for the past 72 hrs:   Temp Heart Rate Pulse Resp BP BP Mean SpO2 Weight   07/06/20 0736 98 F (36.7 C) 89 -- 16 121/78 92 MM HG 98 % --   07/06/20  0657 -- 88 87 16 -- -- 98 % --   07/06/20 0620 -- 88 86 28 111/59 71 MM HG 98 % --   07/06/20 0518 -- 107 109 -- -- -- 96 % --   07/06/20 0513 -- -- 110 -- 130/73 85 MM HG 95 % --   07/06/20 0508 97.2 F (36.2 C) 116 -- 22 139/76 97 MM HG 96 % 95.1 kg (209 lb 11.2 oz)         Documentation Review:  Old medical records, Previous electrocardiograms, Nursing notes, Previous radiology studies    Diagnostics:  Labs:    Results for orders placed or performed during the hospital encounter of 07/06/20   BASIC METABOLIC PANEL   Result Value Ref Range    Potassium 3.8 3.5 - 5.5 mmol/L    Sodium 137 133 - 145 mmol/L    Chloride 100 98 - 110 mmol/L    Glucose 261 (H) 70 - 99 mg/dL    Calcium 9.1 8.4 - 29.5 mg/dL    BUN 6 6 - 22 mg/dL    Creatinine 0.7 0.5 - 1.2 mg/dL    CO2 24 20 - 32 mmol/L    eGFR African American >60.0 >60.0    eGFR Non African American >60.0 >60.0    Anion Gap 13.0 3.0 - 15.0 mmol/L   TROPONIN   Result Value Ref Range    Troponin (T)  Quant High Sensitivity (5th Gen) <6 0 - 19 ng/L   NT PROBNP   Result Value Ref Range    NT proBNP 206 (H) <=125 pg/mL   CBC WITH DIFFERENTIAL AUTO   Result Value Ref Range    WBC x 10*3 11.4 (H) 4.0 - 11.0 K/uL    RBC x 10^6 4.18 3.80 - 5.20 M/uL    HGB 12.2 11.7 - 15.5 g/dL    HCT 11.9 14.7 - 82.9 %    MCV 88 80 - 99 fL    MCH 29 26 - 34 pg    MCHC 33 31 - 36 g/dL    RDW 56.2 13.0 - 86.5 %    Platelet 265 140 - 440 K/uL    MPV 10.1 9.0 - 13.0 fL    Segmented Neutrophils (Auto) 80 (H) 40 - 75 %    Lymphocytes (Auto) 8 (L) 20 - 45 %    Monocytes (Auto) 7 3 - 12 %    Eosinophils (Auto) 4 0 - 6 %    Basophils (Auto) 1 0 - 2 %    Absolute Neutrophils (Auto) 9.1 (H) 1.8 - 7.7 K/uL    Absolute Lymphocytes (Auto) 1.0 1.0 - 4.8 K/uL    Absolute Monocytes (Auto) 0.8 0.1 - 1.0 K/uL    Absolute Eosinophils (Auto) 0.4 0.0 - 0.5 K/uL    Absolute Basophils (Auto) 0.1 0.0 - 0.2 K/uL   D-DIMER QUANTITATIVE   Result Value Ref Range    D-Dimer 0.33 0.00 - 1.12 mg/L FEU     ECG:  Results for  orders placed or performed during the hospital encounter of 07/06/20   EKG 12 LEAD UNIT PERFORMED   Result Value Ref Range Status    Heart Rate 108 bpm Final    RR Interval 556 ms Final    Atrial Rate 108 ms Final    P-R Interval 177 ms Final    P Duration 138 ms Final    P Horizontal Axis -38 deg Final    P Front Axis 74 deg Final    Q Onset 499 ms Final    QRSD Interval 102 ms Final    QT Interval 375 ms Final    QTcB 503 ms Final    QTcF 456 ms Final    QRS Horizontal Axis 202 deg Final    QRS Axis -93 deg Final    I-40 Front Axis -23 deg Final    t-40 Horizontal Axis 200 deg Final    T-40 Front Axis -82 deg Final    T Horizontal Axis 24 deg Final    T Wave Axis 90 deg Final    S-T Horizontal Axis 28 deg Final    S-T Front Axis 88 deg Final    Impression - ABNORMAL ECG -  Final    Impression ST-Sinus tachycardia-rate>99  Final    Impression   Final     PLAE-Probable left atrial enlargement-P >12mS, <-0.33mV V1    Impression   Final     IRAFB-Incomplete RBBB and LAFB-axis(240,-40), S>R II III aVF    Impression   Final     CRVH-Consider right ventricular hypertrophy-large R or R' V1/V2    Impression   Final     LVH1-Left ventricular hypertrophy-multiple LVH criteria    Impression   Final     T3LA-Abnormal T, consider ischemia, lateral leads-T <-0.78mV, I aVL V5 V6    Impression NSC-No significant change since prior tracing-  Final  Rhythm interpretation from monitor: normal sinus rhythm, sinus tachycardia    [facility] US  ED EXAM [facility] ED USE ONLY)   Final Result      CHEST PA AND LATERAL   Final Result   No acute cardiopulmonary process.             Dictated ByArnold Lapine on 07/06/2020 6:06 AM      As attending radiologist, I have assessed the study images, and dictated or reviewed/edited the final report as needed.      Signed By: Charlyne Cools, MD on 07/06/2020 6:53 AM         EKG 12 LEAD UNIT PERFORMED   Final Result
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
## 2020-12-19 NOTE — ED Provider Notes (Signed)
 [facility]    Time of Arrival:   12/19/20 0006      Final diagnoses:   [R07.89] Atypical chest pain (Primary)       Assessment/Differential Diagnosis: Left-sided chest pain, associated nausea and lightheadedness, suspected to be from reflux disease/esophagitis.  Do not suspect ACS, aortic pathology, pneumonia or other infectious etiology, pulmonary embolism.  Does not appear clearly musculoskeletal.  Consider anxiety overlay.    ED Course/Medical Decision Making:  Patient is afebrile and hemodynamically stable upon arrival and nontoxic during initial encounter.  ECG without injury pattern despite continued symptoms.    CXR shows no signs of pneumonia, PTX, effusion, cardiomegaly, etc.    CBC reveals no leukocytosis/ -penia, anemia, or thrombophilia/ -penia.  BMP shows no renal dysfunction, electrolyte abnormality, acid/base disorder, or anion gap.  Trop neg and without significant rrise on repeat.    Given her low pretest probability for pulmonary embolism based on Well's Criteria (no active cancer, pt is active and not bed ridden, no collateral superfical veins, no unilateral leg swelling, no pitting edema, no recent surgery/immobilization/travel) - and low PERC score (low risk category without any of these criteria: Hypoxia <95%, unilateral leg swelling, hemoptysis, prior DVT or PE, recent surgery or trauma, age >75, hormone use, tachycardia), she is felt to be at low risk to not undergo any further testing (i.e. D-dimer, VQ scan, or CTA chest).    At this time, with stable vitals and reassuring exam, I do not feel that further investigation is indicated emergently.  I discussed the results and clinical impression with her.  I made it clear that alternative diagnoses still exist.  However, I discussed with her that we will manage her expectantly as an outpatient on no new medications - with strict instructions to return if symptoms persist, worsen, or if new symptoms  arise.  Otherwise, follow-up will be through her PCP.  All questions were answered.  The patient understands and agrees with the plan.  The patient has remained hemodynamically stable until the time of discharge.                .      .                                          Pre-Hospital/Procedures/Consults/Critical Care: None    .    Disposition:  Home      Discharge Medication List as of 12/19/2020  4:47 AM        Chief Complaint   Patient presents with   . CHEST PAIN (ADULT)       [name] is a 35 y.o. female with a history of hypertension, dyslipidemia, PCOS, idiopathic nonischemic cardiomyopathy, who presents to the ED with complaints of left-sided chest pain.  This started with lightheadedness/dizziness with associated nausea at around 7:30 PM.  There was no associated setting/ trigger to which she can attribute the onset of her symptoms; she was lying down on her right side watching television when the symptoms started.  Last meal prior to symptom onset was a "rodeo burger."  She took a 45-minute shower, and chest discomfort symptoms started about 15 minutes after this was completed.  The severity of the pain has been constant since its onset.  The pain is described as " a constant dull with sharp spurts."  Specifically, it is not  exertional, positional, radiating, or pleuritic.  The patient denies fever, cough, or SOB.  The patient denies having any abdominal pain.  There has been no vomiting.  The patient denies LE pain or swelling - and has no history of DVT or PE, no family history of clotting disorders, no recent travel, no immobilization, recent Sg, exog. steroid use, or known CA.  The patient denies tobacco abuse.  The patient has taken 2 baby aspirin in an attempt to make her symptoms improve. The patient is not normally on blood thinning agents.  The patient denies further associated symptoms.  She denies having any family history..             Review of Systems:  Constitutional: Positive  for malaise and fatigue. Negative for fever.   HENT: Negative for neck pain.    Respiratory: Positive for cough and shortness of breath.    Cardiovascular: Positive for chest pain.   Gastrointestinal: Positive for nausea. Negative for vomiting, abdominal pain and diarrhea.   Genitourinary: Negative for dysuria.   Musculoskeletal: Negative for myalgias.   Skin: Negative for rash.   Neurological: Positive for dizziness and light-headedness. Negative for headaches.   Hematological: does not bruise/bleed easily.   Psychiatric/Behavioral: Positive for sleep disturbance.       Physical Exam  Vitals and nursing note reviewed.   Constitutional:       Appearance: She is not diaphoretic.      Comments: Awake and alert female appearing in no obvious pain at rest.  No respiratory distress and speaks with a clear voice. Pleasant and appropriate in conversation.   HENT:      Head: Normocephalic and atraumatic.   Eyes:      General: No scleral icterus.     Conjunctiva/sclera: Conjunctivae normal.   Cardiovascular:      Rate and Rhythm: Normal rate and regular rhythm.      Heart sounds: Normal heart sounds.   Pulmonary:      Effort: Pulmonary effort is normal.      Breath sounds: Normal breath sounds.   Abdominal:      General: There is no distension.      Palpations: Abdomen is soft.      Tenderness: There is no abdominal tenderness.   Musculoskeletal:      Cervical back: Neck supple.   Skin:     General: Skin is warm and dry.   Neurological:      Mental Status: She is alert.         Past Medical History:   Diagnosis Date   . Adverse effect of anesthesia 2009    per note in Care Everywhere, pt reports awareness during AICD implantation   . Cardiomyopathy (HCC)    . Congestive heart failure, unspecified    . Diabetes mellitus (HCC)     type II- no longer taking meds   . Exercise tolerance finding 05/24/2020    denies cp/ sob with climbing stairs   . Gout, unspecified    . Hypertension    . Hypertriglyceridemia    . 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 IUFD at less than 20 weeks of gestation 04/2020   . Morbidly obese (HCC)    . Nonischemic cardiomyopathy (HCC)    . PCOS (polycystic ovarian syndrome)     pt denies   . Syncope      Past Surgical  History:   Procedure Laterality Date   . AICD IMPLANTATION  07/21/2007   . CARDIAC ABLATION  04/06/2017   . DILATION & EVACUATION N/A 05/28/2020    Procedure: DILATION AND EVACUATION, UTERUS;  Surgeon: Gail Joseph, MD   . HEART CATHETERIZATION  2005   . IMPLANTED DEFIBRILLATOR  09/03/2016    GENERATOR CHANGE--2357-40C Fortify Assura DR; serial # [id number]     Family History   Problem Relation Age of Onset   . Milk Intolerance Mother    . Vision Problems Mother    . Migraines Mother    . Heart Disease Other    . Hypertension Other    . Diabetes Other    . Stroke Other      Social History     Occupational History   . Not on file   Tobacco Use   . Smoking status: Former Smoker     Packs/day: 0.50     Years: 4.00     Pack years: 2.00     Types: Cigarettes     Quit date: 06/17/2013     Years since quitting: 7.5   . Smokeless tobacco: Never Used   Substance and Sexual Activity   . Alcohol use: Not Currently     Alcohol/week: 0.0 standard drinks     Comment: rarely   . Drug use: Yes     Comment: occ marijuana   . Sexual activity: Not Currently     No outpatient medications have been marked as taking for the 12/19/20 encounter [facility] Encounter).     Allergies   Allergen Reactions   . Aldactone [Spironolactone] hives   . Latex rash/itching       Vital Signs:  Patient Vitals for the past 72 hrs:   Temp Heart Rate Resp BP BP Mean SpO2 Weight   12/19/20 0024 97.1 F (36.2 C) 69 18 129/75 93 MM HG 100 % 91 kg (200 lb 9.6 oz)         Documentation Review:  Old medical records, Nursing notes    Diagnostics:  Labs:    Results for orders placed or performed during the hospital encounter of 12/19/20   BASIC METABOLIC  PANEL   Result Value Ref Range    Potassium 3.9 3.5 - 5.5 mmol/L    Sodium 138 133 - 145 mmol/L    Chloride 104 98 - 110 mmol/L    Glucose 133 (H) 70 - 99 mg/dL    Calcium 9.5 8.4 - 96.2 mg/dL    BUN 8 6 - 22 mg/dL    Creatinine 0.6 0.5 - 1.2 mg/dL    CO2 26 20 - 32 mmol/L    eGFR >60.0 >60.0 mL/min/1.73 sq.m.    Anion Gap 8.0 3.0 - 15.0 mmol/L   Troponin Care Path   Result Value Ref Range    Troponin (T) Quant High Sensitivity (5th Gen) 10 0 - 19 ng/L   CBC WITH DIFFERENTIAL AUTO   Result Value Ref Range    WBC x 10*3 9.5 4.0 - 11.0 K/uL    RBC x 10^6 4.22 3.80 - 5.20 M/uL    HGB 12.2 11.7 - 15.5 g/dL    HCT 95.2 84.1 - 32.4 %    MCV 87 80 - 99 fL    MCH 29 26 - 34 pg    MCHC 33 31 - 36 g/dL    RDW 40.1 02.7 - 25.3 %    Platelet 293 140 - 440 K/uL  MPV 9.9 9.0 - 13.0 fL    Segmented Neutrophils (Auto) 53 40 - 75 %    Lymphocytes (Auto) 31 20 - 45 %    Monocytes (Auto) 10 3 - 12 %    Eosinophils (Auto) 6 0 - 6 %    Basophils (Auto) 1 0 - 2 %    Absolute Neutrophils (Auto) 5.0 1.8 - 7.7 K/uL    Absolute Lymphocytes (Auto) 2.9 1.0 - 4.8 K/uL    Absolute Monocytes (Auto) 0.9 0.1 - 1.0 K/uL    Absolute Eosinophils (Auto) 0.6 (H) 0.0 - 0.5 K/uL    Absolute Basophils (Auto) 0.1 0.0 - 0.2 K/uL   Troponin 1 HR   Result Value Ref Range    Troponin (T) Quant High Sensitivity (5th Gen) 7 0 - 19 ng/L     ECG:  Results for orders placed or performed during the hospital encounter of 12/19/20   EKG 12-LEAD   Result Value Ref Range Status    Heart Rate 69 bpm Final    RR Interval 872 ms Final    Atrial Rate 68 ms Final    P-R Interval 199 ms Final    P Duration 126 ms Final    P Horizontal Axis -13 deg Final    P Front Axis 59 deg Final    Q Onset 501 ms Final    QRSD Interval 107 ms Final    QT Interval 408 ms Final    QTcB 437 ms Final    QTcF 427 ms Final    QRS Horizontal Axis 229 deg Final    QRS Axis -62 deg Final    I-40 Front Axis 18 deg Final    t-40 Horizontal Axis 197 deg Final    T-40 Front Axis -78 deg Final    T  Horizontal Axis 97 deg Final    T Wave Axis 70 deg Final    S-T Horizontal Axis 72 deg Final    S-T Front Axis 95 deg Final    Impression - ABNORMAL ECG -  Final    Impression SR-Sinus rhythm-normal P axis, V-rate 50-99  Final    Impression   Final     IRAFB-Incomplete RBBB and LAFB-axis(240,-40), S>R II III aVF    Impression -Rate slower, otherwise similar to prior-  Final       Rhythm interpretation from monitor: normal sinus rhythm    CHEST PA AND LATERAL   Final Result   No radiographic evidence of acute cardiopulmonary process.       Dictated ByRosey Constant on 12/19/2020 2:59 AM      As attending radiologist, I have assessed the study images, and dictated or reviewed/edited the final report as needed.      Signed By: Annitta Basil, MD on 12/19/2020 5:53 AM         EKG 12-LEAD   Final Result

## 2021-02-17 NOTE — ED Provider Notes (Signed)
 ED Resident Supervision Note    I have seen and evaluated [name] and agree with the provider's findings (exceptions, if any, noted below).  I reviewed and directed the Emergency Department treatment given to [name]     A/P: Hip contusion    Critical Care Time:      Procedures:    S:  35 y.o. female  with a chief complaint of HIP INJURY      O:    Patient Vitals for the past 72 hrs:   Temp Heart Rate Pulse Resp BP BP Mean SpO2 Weight   02/17/21 1644 -- 76 77 -- -- -- 100 % --   02/17/21 1558 98.2 F (36.8 C) 75 76 18 140/88 105 MM HG 100 % --   02/17/21 1504 98.5 F (36.9 C) 79 -- 17 135/90 105 MM HG 98 % 90.7 kg (200 lb)        Slipped and fell landing on her lateral left hip  Complains of pain in the lateral left hip which is worsened by movement  No visible swelling or ecchymosis  Not shortened or externally rotated  X-ray hip without evidence of fracture dislocation  Crutches, weightbearing as tolerated, pain control

## 2021-02-17 NOTE — ED Provider Notes (Signed)
 [facility]    Time of Arrival:   02/17/21 1500      Final diagnoses:   [Z61.096E] Hip injury, left, initial encounter (Primary)   [A54.XXXA] Fall, initial encounter       Assessment/Differential Diagnosis:    Differential diagnosis included but was not limited to left hip fracture, left femur fracture, neurovascular injury, and traumatic head injury    ED Course/Medical Decision Making:    [name] is a 35 year old female who presented following a left hip injury sustained at work.  Upon arrival vitals were noted to be stable.  No obvious deformities noted in the left lower extremity on physical exam.  She denied any other injury during the fall.  Pelvic and left femur x-rays were obtained to further evaluate for left lower extremity injury, this revealed no evidence for acute injury or fracture.  Chest x-ray also obtained which revealed no acute injury.  Physical examination was reassuring, distal pulses in the left lower extremities were palpable and sensation to light touch was intact.  Her head was atraumatic, she denied any loss of consciousness or trauma to the head during the fall,  was able to recall the events of her fall clearly and did not have any vomiting or nausea.  She also is not anticoagulated therefore, imaging of the head was not obtained at this time.  She did not endorse any pain with palpation throughout the spine, chest wall, or any other extremities outside the left lower extremity, traumatic injury to these areas therefore was deemed less likely and no further imaging of the areas was obtained.    The plan for discharge with outpatient follow-up with a primary care provider was discussed.  She was provided with crutches to use as needed for ambulation.  She was advised to use an over-the-counter regimen of Tylenol and ibuprofen as needed for pain control.  Strict return precautions to the emergency department were discussed with the patient  and she expressed understanding.  She was comfortable for plan for discharge.    ED Course as of 02/17/21 1825   Mon Feb 17, 2021   1708 Rechecked patient who is resting comfortably following morphine injection.  Reports pain has improved. [NH]   1749 CHEST PORTABLE  Negative for acute findings. [NH]   1752 PELVIS PORTABLE  Negative for acute findings.  [NH]   1801 X-RAY EXAM FEMUR,UNILAT,>= 2 VW,LT  No acute fracture or other injury noted. [NH]              .      .                                           .    Disposition:  Home      New Prescriptions    No medications on file     Chief Complaint   Patient presents with   . HIP INJURY       HPI   [name] is a 35 year old female with a past medical history of diabetes mellitus, hypertension, dilated cardiomyopathy, and implantable cardiac defibrillator who presents due to left hip injury.  While at work approximately 1.5 hours prior to arrival a piece of drywall fell from the ceiling, she attempted to sidestep out of the way and slipped on water and debris from the ceiling.  She reports falling directly  onto the lateral aspect of the left hip.  She was unable to ambulate following the fall.  She denies any other injuries including trauma to the head, loss of consciousness, back, chest, or other extremities.  She does not take any anticoagulants.  She denies any numbness or weakness in the left lower extremity.    Review of Systems:  Constitutional: Negative for fever.   HENT: Negative for neck pain.    Respiratory: Negative for shortness of breath.    Cardiovascular: Negative for chest pain.   Gastrointestinal: Negative for abdominal pain.   Genitourinary: Negative for flank pain.   Musculoskeletal: Positive for arthralgias and gait problem. Negative for back pain and joint swelling.   Skin: Negative for color change.   Neurological: Negative for weakness, numbness, headaches and loss of consciousness.       Physical Exam  Constitutional:        Appearance: Normal appearance.      Comments: Appears uncomfortable with positional changes.   HENT:      Head: Normocephalic and atraumatic.      Nose: Nose normal.      Mouth/Throat:      Mouth: Mucous membranes are moist.      Pharynx: Oropharynx is clear.   Eyes:      Conjunctiva/sclera: Conjunctivae normal.   Neck:      Comments: No cervical spinal tenderness to palpation.  Cardiovascular:      Rate and Rhythm: Normal rate and regular rhythm.   Pulmonary:      Effort: Pulmonary effort is normal.      Breath sounds: Normal breath sounds.   Abdominal:      Palpations: Abdomen is soft.      Tenderness: There is no abdominal tenderness.   Musculoskeletal:      Comments: Bilateral lower extremities appear to be in gross anatomic alignment.  Significant tenderness to light palpation over the anterior proximal femur.  Tenderness palpation diffusely throughout the left hip.  Pelvis is stable.  No overlying skin changes noted.  No obvious deformities noted.  Palpable DP and PT pulses bilaterally.    No tenderness palpation over the chest wall.  No tenderness palpation throughout bilateral upper extremities.  No tenderness to palpation right lower extremity.   Skin:     General: Skin is warm and dry.      Capillary Refill: Capillary refill takes less than 2 seconds.      Findings: No bruising.   Neurological:      General: No focal deficit present.      Mental Status: She is alert and oriented to person, place, and time.      Comments: Sensation to light touch intact throughout the bilateral lower extremities.  5 out of 5 strength in bilateral plantar and dorsi flexion.  Unable to assess range of motion in the left knee or hip secondary to patient pain.         Past Medical History:   Diagnosis Date   . Adverse effect of anesthesia 2009    per note in Care Everywhere, pt reports awareness during AICD implantation   . Cardiomyopathy (HCC)    . Congestive heart failure, unspecified    . Diabetes mellitus (HCC)     type II-  no longer taking meds   . Exercise tolerance finding 05/24/2020    denies cp/ sob with climbing stairs   . Gout, unspecified    . Hypertension    . Hypertriglyceridemia    .  ICD (implantable cardiac defibrillator) in place 08/03/2007    [facility] [employer] 2207 dual, generator change 2018--[facility]--Fortify Assura DR   . IUFD at less than 20 weeks of gestation 04/2020   . Morbidly obese (HCC)    . Nonischemic cardiomyopathy (HCC)    . PCOS (polycystic ovarian syndrome)     pt denies   . Syncope      Past Surgical History:   Procedure Laterality Date   . AICD IMPLANTATION  07/21/2007   . CARDIAC ABLATION  04/06/2017   . DILATION & EVACUATION N/A 05/28/2020    Procedure: DILATION AND EVACUATION, UTERUS;  Surgeon: Gail Joseph, MD   . HEART CATHETERIZATION  2005   . IMPLANTED DEFIBRILLATOR  09/03/2016    GENERATOR CHANGE--2357-40C Fortify Assura DR; serial # [id number]     Family History   Problem Relation Age of Onset   . Milk Intolerance Mother    . Vision Problems Mother    . Migraines Mother    . Heart Disease Other    . Hypertension Other    . Diabetes Other    . Stroke Other      Social History     Occupational History   . Not on file   Tobacco Use   . Smoking status: Former Smoker     Packs/day: 0.50     Years: 4.00     Pack years: 2.00     Types: Cigarettes     Quit date: 06/17/2013     Years since quitting: 7.6   . Smokeless tobacco: Never Used   Substance and Sexual Activity   . Alcohol use: Not Currently     Alcohol/week: 0.0 standard drinks     Comment: rarely   . Drug use: Yes     Comment: occ marijuana   . Sexual activity: Not Currently     No outpatient medications have been marked as taking for the 02/17/21 encounter [facility] Encounter).     Allergies   Allergen Reactions   . Aldactone [Spironolactone] hives   . Latex rash/itching       Vital Signs:  Patient Vitals for the past 72 hrs:   Temp Heart Rate Pulse Resp BP BP Mean SpO2 Weight   02/17/21 1644 -- 76 77 -- -- -- 100 % --   02/17/21 1558 98.2 F (36.8 C)  75 76 18 140/88 105 MM HG 100 % --   02/17/21 1504 98.5 F (36.9 C) 79 -- 17 135/90 105 MM HG 98 % 90.7 kg (200 lb)         Documentation Review:  Old medical records    Diagnostics:  Labs:  No results found for this visit on 02/17/21.  ECG:  No results found for this visit on 02/17/21.    Rhythm interpretation from monitor: normal sinus rhythm    CHEST PORTABLE   Final Result      No acute findings.      Signed By: Charlyne Cools, MD on 02/17/2021 5:46 PM         PELVIS PORTABLE   Final Result      No acute radiographic findings.      Signed By: Charlyne Cools, MD on 02/17/2021 5:44 PM         X-RAY EXAM FEMUR,UNILAT,>= 2 VW,LT   Final Result      No acute osseous findings.      Faint nodular soft tissue densities projecting at mid thigh level on frontal  view only, of uncertain significance. Query artifact and/or external density for which clinical correlation is advised.      Signed By: Charlyne Cools, MD on 02/17/2021 5:51 PM

## 2021-03-03 ENCOUNTER — Ambulatory Visit: Attending: Specialist

## 2021-03-03 ENCOUNTER — Ambulatory Visit: Admit: 2021-03-03 | Discharge: 2021-03-03 | Payer: MEDICAID | Attending: Specialist

## 2021-03-03 DIAGNOSIS — Z3A01 Less than 8 weeks gestation of pregnancy: Secondary | ICD-10-CM

## 2021-03-03 NOTE — Progress Notes (Signed)
Progress Notes by Tonia Brooms, MD at 03/03/21 1440                Author: Tonia Brooms, MD  Service: --  Author Type: Physician       Filed: 03/03/21 1554  Encounter Date: 03/03/2021  Status: Signed          Editor: Tonia Brooms, MD (Physician)                       Patient: [name]                MRN:  [id number]       SSN: [id number]   Date of Birth: Jan 22, 1986        AGE:  35 y.o.        SEX: female      PCP: None   03/03/21        Chief Complaint       Patient presents with        ?  Hip Pain             LT        HISTORY:  AMAANI [name] is a 35 y.o. female who is seen for lower back and left hip pain. She sustained an injury at work when a ceiling  tile fell on her back while she was moving a resident to a different room. The ceiling tiles wer wet from a broken pipe overhead.  She did not have any previous pain or problems with her hip before the injury.          Pain Assessment   03/03/2021        Location of Pain  Hip     Location Modifiers  Left     Severity of Pain  3     Quality of Pain  Dull;Other (Comment)     Quality of Pain Comment  sore and tender     Duration of Pain  Persistent     Frequency of Pain  Constant     Date Pain First Started  (No Data)     Date Pain First Started Comment  follow up     Aggravating Factors  Walking;Bending;Squatting;Stairs     Limiting Behavior  Some        Relieving Factors  Ice        Occupation, etc:  Ms. Barnier works as a Merchandiser, retail at [employer]. She is going back to school to finish her bachelors degree and she hopes to go to med school. She has been out of work since her injury. She lives in [place] with her husband.   Ms. Deisher weighs 203 lbs and is 5'5" tall.  She just found out that she is pregnant with her first child.       No results found for: HBA1C, HBA1CPOC, HBA1CEXT, HBA1CEXT        Weight Metrics  03/03/2021  01/05/2020  09/12/2012          Weight  203 lb 9.6 oz  212 lb  275 lb          BMI  33.88  kg/m2  35.28 kg/m2  45.76 kg/m2           There is no problem list on file for this patient.      REVIEW OF SYSTEMS:     Constitutional Symptoms: Negative    Eyes: Negative  Ears, Nose, Throat and Mouth: Negative    Cardiovascular: Negative    Respiratory: Negative    Genitourinary: Per HPI    Gastrointestinal: Per HPI    Integumentary (Skin and/or Breast): Negative    Musculoskeletal: Per HPI    Endocrine/Rheumatologic: Negative    Neurological: Per HPI    Hematology/Lymphatic: Negative     Allergic/Immunologic: Negative    Phychiatric: Negative        Social History          Socioeconomic History         ?  Marital status:  DIVORCED              Spouse name:  Not on file         ?  Number of children:  Not on file     ?  Years of education:  Not on file     ?  Highest education level:  Not on file       Occupational History        ?  Not on file       Tobacco Use         ?  Smoking status:  Light Smoker              Packs/day:  0.25         Types:  Cigarettes         ?  Smokeless tobacco:  Never       Vaping Use         ?  Vaping Use:  Never used       Substance and Sexual Activity         ?  Alcohol use:  No     ?  Drug use:  No     ?  Sexual activity:  Not on file        Other Topics  Concern        ?  Not on file       Social History Narrative        ?  Not on file          Social Determinants of Health          Financial Resource Strain: Not on file     Food Insecurity: Not on file     Transportation Needs: Not on file     Physical Activity: Not on file     Stress: Not on file     Social Connections: Not on file     Intimate Partner Violence: Not on file       Housing Stability: Not on file           Allergies        Allergen  Reactions         ?  Latex  Hives         ?  Aldactone [Spironolactone]  Vertigo           Current Outpatient Medications        Medication  Sig         ?  erythromycin (ILOTYCIN) ophthalmic ointment  Apply thin film to both eyes on the upper and the lower lid 4 times a day for a total  of 7 days.     ?  HYDROcodone-acetaminophen (NORCO) 5-325 mg per tablet  Take 1 tablet by mouth every four (4) hours as needed for Pain. (Patient not taking: Reported on 01/05/2020)     ?  metFORMIN (GLUCOPHAGE) 500 mg tablet  Take 500 mg by mouth two (2) times daily (with meals). Indications: TYPE 2 DIABETES MELLITUS (Patient not taking: Reported on 01/05/2020)     ?  eplerenone (INSPRA) 25 mg tablet  Take 25 mg by mouth daily. Indications: CHRONIC HEART FAILURE FOLLOWING MYOCARDIAL INFARCTION (Patient not taking: Reported on 01/05/2020)     ?  carvedilol (COREG) 6.25 mg tablet  Take 6.25 mg by mouth two (2) times daily (with meals). Indications: CHRONIC HEART FAILURE (Patient not taking: Reported on 01/05/2020)     ?  lisinopril (PRINIVIL, ZESTRIL) 10 mg tablet  Take 10 mg by mouth daily. Indications: HYPERTENSION (Patient not taking: Reported on 01/05/2020)     ?  nitroglycerin (NITROSTAT) 0.4 mg SL tablet  0.4 mg by SubLINGual route every five (5) minutes as needed for Chest Pain. Indications: ANGINA (Patient not taking: Reported on 01/05/2020)     ?  aspirin 81 mg chewable tablet  Take 81 mg by mouth daily. (Patient not taking: Reported on 01/05/2020)         ?  omega-3 fatty acids-vitamin e (FISH OIL) 1,000 mg cap  Take 1 Cap by mouth. (Patient not taking: Reported on 01/05/2020)          No current facility-administered medications for this visit.         PHYSICAL EXAMINATION:   Visit Vitals      Temp  97.6 ??F (36.4 ??C) (Temporal)     Ht  5\' 5"  (1.651 m)     Wt  203 lb 9.6 oz (92.4 kg)        BMI  33.88 kg/m??         ORTHO EXAMINATION:       Examination  Right hip  Left hip     Skin  Intact  Intact     External Rotation ROM  10  20     Internal Rotation ROM  10  10     Trochanteric tenderness  -  -     Hip flexion contracture  -  -     Antalgic gait  -  -     Trendelenberg sign  -  -     Lumbar tenderness  -  -     Straight leg raise  -  -     Calf tenderness  -  -         Neurovascular  Intact  Intact               RADIOGRAPHS:   XR FEMUR 02/17/21 [facility]   negative      IMPRESSION:               ICD-10-CM  ICD-9-CM             1.  Less than [redacted] weeks gestation of pregnancy   Z3A.01  V22.2                    2.  Lumbar sprain, initial encounter   S33.5XXA  847.2  REFERRAL TO PHYSICAL THERAPY               PLAN:  She will start a brief course of outpatient physical therapy. A note was provided to return to work tomorrow 03/04/21. There is no need for  surgery at this time. [facility].        Scribed by 03/06/21 (Scribekick) as  dictated by Delrae Sawyers, MD

## 2021-03-27 ENCOUNTER — Inpatient Hospital Stay: Admit: 2021-03-27 | Payer: MEDICAID

## 2021-03-27 NOTE — Progress Notes (Signed)
[facility]   [address] [place], [place] [address]  Phone: [phone] Fax: [phone]  PLAN OF CARE / STATEMENT OF MEDICAL NECESSITY FOR PHYSICAL THERAPY SERVICES  Patient Name: [name] DOB: 01-18-1986   Medical   Diagnosis: Other low back pain [M54.59] Treatment Diagnosis: LS radiculopathy    Onset Date: 02/17/2021     Referral Source: Tonia Brooms, MD Start of Care [facility]): 03/27/2021   Prior Hospitalization: See medical history Provider #: [phone]   Prior Level of Function: Functional I with hx of mild back pain prior to fall    Comorbidities: CHF/ pace maker, [redacted] weeks pregnant    Medications: Verified on Patient Summary List   The Plan of Care and following information is based on the information from the initial evaluation.   ========================================================================  Assessment / key information:  Pt is a 35 y.o. year old female who presents with co LS pain, numbness into the L buttock and intermittent shooting pain from hip to knee and numbness into the toes sp fall at work on 02/17/2021 sec to ceiling tile falling on her.  Since injury, patient reports pain ranging from 4 to 10/10 and using heat and ice to manage without use of pain medication. 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   Active Movements: Guarded with all movement   ROM % AROM Comments:pain, area   Forward flexion  Unilateal with R UE only       Extension Dec 75% L side feels "stuck"    SB right  Dec 50%     SB left Dec 50%     Rotation right Dec 50%     Rotation left Dec 50%       Dural Mobility:  Seated slump test : negative for sharp pain, + for posterior chain tightness   Palpation L SIJ , glut and lateral hip tightness   Strength  Hip : not tolerated   Core bridge dec 90%   Sacroilliac:     Long sit test + L short long               L upslip     Flexibility:                              Piriformis (seated figure 4):    + B (L greater than R)                           HS + B .    Home exercise program initiated on initial evaluation to address these deficits. Pt would benefit from PT to address these deficits for increased functional mobility and QOL.  ========================================================================  Eval  Complexity: History: MEDIUM  Complexity : 1-2 comorbidities / personal factors will impact the outcome/ POC Exam:HIGH Complexity : 4+ Standardized tests and measures addressing body structure, function, activity limitation and / or participation in recreation  Presentation: MEDIUM Complexity : Evolving with changing characteristics  Clinical Decision Making:MEDIUM Complexity : FOTO score of 26-74Overall Complexity:MEDIUM  Problem List: pain affecting function, decrease ROM, decrease strength, impaired gait/ balance, decrease ADL/ functional abilitiies, decrease activity tolerance, decrease flexibility/ joint mobility, and decrease transfer abilities   Treatment Plan may include any combination of the following: Therapeutic exercise, Therapeutic activities, Neuromuscular re-education, Physical agent/modality, Gait/balance training, Manual therapy, Aquatic therapy, Patient education, Self Care training, Functional mobility training, Home safety training, and Stair training  Patient / Family readiness to learn indicated by: asking questions, trying to perform skills and interest  Persons(s) to be included in education: patient  (P)  Barriers to Learning/Limitations: None  Measures taken:    Patient Goal (s): "Pain relief"   Patient self reported health status: fair  Rehabilitation Potential: good  Short Term Goals: To be accomplished in  1  weeks:  1.  Pt will be independent and compliant with HEP  Status at last assessment :HEP est at initial eval and will be progress as needed.   Long Term Goals: To be accomplished in  8-10  treatments:  1.  Patient will increase FOTO score to 62/100 for indications of increased functional mobility.   Status at last assessment : 44/100   2.  Patient will demo negative lateral shift for improved sitting posture at work   Status at last assessment : + L shift   3. Patient will demo LS flexion fingertip to mid shin for ease with LE dressing   Status at last assessment :unilateral and guarded to knees   4. Patient will demo 50% bridge for improved core stability with bending activities   Status at last assessment :10%  Frequency / Duration:   Patient to be seen  2  times per week for 8-10  treatments:   Patient / Caregiver education and instruction: self care, activity modification and exercises  Therapist Signature: Cecilio Asper, PT Date: 03/27/2021   Certification Period: NA Time: 321p   ========================================================================  I certify that the above Physical Therapy Services are being furnished while the patient is under my care.  I agree with the treatment plan and certify that this therapy is necessary.  Physician Signature:        Date:       Time:                                         Tonia Brooms, MD  Please sign and return to [facility] or you may fax the signed copy to [phone].  Thank you.

## 2021-03-27 NOTE — Progress Notes (Signed)
PT LUMBAR EVAL AND TREATMENT     Patient Name: [name]  Date:03/27/2021  DOB: 13-Apr-1986  [x]   Patient DOB Verified  Payor: Advertising copywriter MEDICAID / Plan: [facility] MEDICAID COMMUNITY HEALTH PLAN / Product Type: Managed Care Medicaid /    In time:211  Out time:243  Total Treatment Time (min): 32  Visit #: 1 of 8-10    Treatment Area: Other low back pain [M54.59]    SUBJECTIVE  Pain Level (0-10 scale): (C): 5 (B): 4  (W):  10  Any medication changes, allergies to medications, diagnosis change, or new procedure performed: see summary sheet for update  Subjective functional status/changes  CHIEF COMPLAINT: Patient presents with co LS pain, numbness into the L buttock and intermittent shooting pain from hip to knee and numbness into the toes sp fall at work on 02/17/2021 sec to ceiling tile falling on her.  Since injury, patient reports pain ranging from 4 to 10/10 and using heat and ice to manage without use of pain medication. PMH significant for pregnancy x 8 week as of 03/27/21, CHF with pacemaker.    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     Active Movements: Guarded with all movement   ROM % AROM Comments:pain, area   Forward flexion  Unilateal with R UE only     Extension Dec 75% L side feels "stuck"    SB right  Dec 50%    SB left Dec 50%    Rotation right Dec 50%    Rotation left Dec 50%      Dural Mobility:  Seated slump test : negative for sharp pain, + for posterior chain tightness     Palpation L SIJ , glut and lateral hip tightness     Strength  Hip : not tolerated    Core bridge dec 90%     Special Tests    Sacroilliac:  Long sit test + L short long    L upslip       Flexibility:     Piriformis (seated figure 4): + B (L greater than R)     HS + B      Patient Education/ Therapeutic Exercise : [x]  Discussed POT including PT expectation, established HEP with pictures and instruction  (minutes) :8 min     Pain Level (0-10 scale) post treatment: 4    ASSESSMENT  [x]   See Plan of Care    PLAN  [x]   Upgrade activities as tolerated      [x]  Other:_  POC 2x 8-10  Cecilio Asper, PT 03/27/2021    Justification for Eval Code Complexity:  Patient History : mild obesity   Examination see exam   Clinical Presentation: evolving sec to radicular sx   Clinical Decision Making : FOTO : 44 /100

## 2021-04-01 ENCOUNTER — Inpatient Hospital Stay: Admit: 2021-04-01 | Payer: MEDICAID

## 2021-04-01 NOTE — Progress Notes (Signed)
 PT DAILY TREATMENT NOTE 8-14    Patient Name: [name]  Date:04/01/2021  DOB: 18-Jul-1986  [x]   Patient DOB Verified  Payor: [employer] MEDICAID / Plan: [facility] MEDICAID COMMUNITY HEALTH PLAN / Product Type: Managed Care Medicaid /    In time:1313  Out time:1412  Total Treatment Time (min): 59  Visit #: 2 of 10    Treatment Area:   Other low back pain [M54.59]    SUBJECTIVE  Pain Level (0-10 scale): 3/10  Any medication changes, allergies to medications, adverse drug reactions, diagnosis change, or new procedure performed?: [x]  No    []  Yes (see summary sheet for update)  Subjective functional status/changes:   []  No changes reported  Better, I do the exercises before breakfast, lunch, and dinner and they make my day better.    OBJECTIVE    Modality rationale: decrease pain and increase tissue extensibility to improve the patient's ability to perform ADLs and rest comfortably following therapy.   Min Type Additional Details     []  Estim:  [] Unatt       [] IFC  [] Premod                        [] Other: [] w/ice   [] w/heat  Position:   Location:      []  Estim: [] Att    [] TENS instruct  [] NMES                    [] Other: [] w/US    [] w/ice   [] w/heat  Position:  Location:      []   Traction: []  Cervical       [] Lumbar                       []  Prone          [] Supine                       [] Intermittent   [] Continuous Lbs:  []  before manual  []  after manual      []   Ultrasound: [] Continuous   []  Pulsed                           []   [] W/cm2:  Location:      []   Iontophoresis with dexamethasone        Location: []  Take home patch  []  In clinic    10  []   Ice     [x]   heat  []   Ice massage  []   Laser  []   Anodyne Position: prone  Location: L hip and l/s      []   Laser with stim  []   Other: Position:  Location:      []   Vasopneumatic Device    []   Right     []   Left  Pre-treatment girth:  Post-treatment girth:  Measured at (location): Pressure:       []  lo []  med []  hi  Temperature: []  lo []  med []  hi     [x]  Skin assessment post-treatment:  [x] intact [] redness- no adverse reaction    [] redness - adverse reaction    19 min Therapeutic Exercise:  [x]  See flow sheet :   Rationale: increase ROM and increase strength to improve trunk strength/mobility to improve ease of ADLs, household chores, and gait.     15 min Therapeutic Activity:  [x]   See flow sheet :   Rationale: increase strength, improve coordination, improve balance, and increase proprioception to improve the patient's ability to perform transfers, stair negotiation, and functional carries.     15 min Neuromuscular Re-education:  [x]   See flow sheet :   Rationale: increase strength, improve coordination, improve balance and increase proprioception to improve the patient's ability to perform functional tasks with improved abdominal brace utilization.     NT min Gait Training:    50 feet x 2 of following-  []  Banded March   []  Banded Lateral     []  Vertical HT  []  Retrograde    []  Banded Monster   []  Dual Task  []  Hurdles          []  Mario                    []  Ladder Drills  []  Heel Walk      []  Toe Walk   Rationale: improve coordination and gait mechanics to normalize gait pattern for safety with household/community ambulation                                                                   With   [x]  TE   []  TA   []  neuro   []  other: Patient Education: [x]  Review HEP      Potential for DOMs w/ new activity and difference between discomfort vs pain.   Slow intro of movement and stretching to offset tightness.   Use of heat to offset DOMs.  Pt verbalized understanding and denied any concerns for PT post tx.  Nature of muscle guarding following traumatic fall.          Other Objective/Functional Measures:   Initiated POC per FS       Pain Level (0-10 scale) post treatment: 3/10 looser    ASSESSMENT/Changes in Function:   Pt seen for 1st treatment since IE. Verbal /tactile cuing provided throughout session to educate patient on correct technique with newly  introduced interventions, develop tolerance to interventions, decrease kinesiophobia with movement. Pt states tightness 2/2 fall and subsequent muscular guarding. She is slow and guarded in all interventions, but motivated to continue and receptive to all pt edu. Session concluded with MHP with good tolerance, notes same pain level but looser feeling in L hip.       Patient will continue to benefit from skilled PT services to modify and progress therapeutic interventions, address functional mobility deficits, address ROM deficits, address strength deficits, analyze and address soft tissue restrictions, analyze and cue movement patterns, analyze and modify body mechanics/ergonomics, assess and modify postural abnormalities, address imbalance/dizziness, and instruct in home and community integration to attain remaining goals.     []   See Plan of Care  []   See progress note/recertification  []   See Discharge Summary         PN due 10/8    Progress towards goals / Updated goals:  Short Term Goals: To be accomplished in  1  weeks:  1.  Pt will be independent and compliant with HEP  Status at last assessment :HEP est at initial eval and will be progress as needed.   Current 04/01/21 goal met, pt reports 3x/daily compliance  Long  Term Goals: To be accomplished in  8-10  treatments:  1.  Patient will increase FOTO score to 62/100 for indications of increased functional mobility.   Status at last assessment : 44/100   2.  Patient will demo negative lateral shift for improved sitting posture at work   Status at last assessment : + L shift   3. Patient will demo LS flexion fingertip to mid shin for ease with LE dressing   Status at last assessment :unilateral and guarded to knees   4. Patient will demo 50% bridge for improved core stability with bending activities   Status at last assessment :10%    PLAN  [x]   Upgrade activities as tolerated     [x]   Continue plan of care  []   Update interventions per flow sheet       []    Discharge due to:_  []   Other:_      Leita Bellman, PT 04/01/2021  8:34 AM    Future Appointments   Date Time Provider Department Center   04/01/2021  1:15 PM Bellman Leita, PT [facility]   04/03/2021  2:00 PM Bellman Leita, PT [facility]   04/07/2021  2:00 PM [facility]   04/09/2021  2:00 PM Griselda Dorn HERO, PTA [facility]   04/14/2021  2:00 PM Ethyl Donnice SAUNDERS [facility]   04/16/2021  2:00 PM Quiazon, Jonathan M, PTA [facility]   04/21/2021  2:00 PM Ethyl Donnice SAUNDERS [facility]   04/23/2021  2:00 PM Quiazon, Jonathan M, PTA [facility]

## 2021-04-03 ENCOUNTER — Inpatient Hospital Stay: Payer: MEDICAID

## 2021-04-07 ENCOUNTER — Inpatient Hospital Stay: Payer: MEDICAID

## 2021-04-09 ENCOUNTER — Inpatient Hospital Stay: Payer: MEDICAID

## 2021-04-16 ENCOUNTER — Inpatient Hospital Stay: Admit: 2021-04-16 | Payer: MEDICAID

## 2021-04-16 NOTE — Progress Notes (Signed)
[facility]   [address] [place], [place] [address]  Phone: [phone] Fax: [phone]  PROGRESS NOTE  Patient Name: [name] DOB: 12/06/1985   Treatment/Medical Diagnosis: Other low back pain [M54.59]   Referral Source: Tonia Brooms, MD     Date of Initial Visit: 03/27/21 Attended Visits: 3 Missed Visits: 3     SUMMARY OF TREATMENT  Pt is a 35 y.o. year old female who presents with co LS pain, numbness into the L buttock and intermittent shooting pain from hip to knee and numbness into the toes sp fall at work on 02/17/2021 sec to ceiling tile falling on her. Treatment has consisted of the following:  Therapeutic exercise, Therapeutic activities, Neuromuscular re-education, Physical agent/modality, Gait/balance training, Patient education, Self Care training, Functional mobility training, Home safety training, and Stair training.    CURRENT STATUS  Patient has attended three sessions, making minimal progress at this time. Patient reports having difficulty attending sessions recently due to personal reasons, but now feels able to reinitiate treatment consistently. Patient admits to HEP compliance with relief in addressing her symptoms. Assessment for continuation as follows:    ROM % AROM Comments:pain, area   Forward flexion  Fingertips to infrapatellar     Extension Dec 25% ERP   SB right  Dec 50%     SB left Dec 50%     Rotation right Dec 50%  Stiffness   Rotation left Dec 50%  Stiffness      Dural Mobility:  Seated slump test: (+) left for pain, pain with slump  Palpation (L) SIJ , glut and lateral hip tightness   Strength  Hip: unable to test today  Core: bridge decreased 75%      Sacroilliac:     Long sit test + left short; left upslip, likely muscular in nature     Flexibility:                              Piriformis (seated figure 4):    + B (L greater than R)                           HS + B        Goal/Measure of Progress   1.   Patient will increase FOTO score to 62/100 for indications of increased functional mobility.   Status at last assessment: 44/100   Current: goal progressing; 52/100  2.  Patient will demo negative lateral shift for improved sitting posture at work   Status at last assessment: + L shift   Current: goal progressing; min (L) shift  3. Patient will demo LS flexion fingertip to mid shin for ease with LE dressing   Status at last assessment: unilateral and guarded to knees   Current: goal progressing; bilateral flexion fingertips to infrapatellar region  4. Patient will demo 50% bridge for improved core stability with bending activities   Status at last assessment: 10%  Current: goal progressing; 25% AROM after cueing to avoid valsalva    New Goals to be achieved in __8-12__  treatments:  Continue per established goals.      RECOMMENDATIONS  Recommend continue skilled PT at 2-3x/weekly for 4 weeks to address strength/mobility deficits, achieve stated goals, and progress patient towards PLOF.    If you have any questions/comments  please contact us directly at [phone].   Thank you for allowing [place] to assist in the care of your patient.  LPTA Signature: Carin Primrose, [place]  Date: 04/16/2021   PT Signature: Ashley Akin DPT  Time: 6:41 PM   NOTE TO PHYSICIAN:  PLEASE COMPLETE THE ORDERS BELOW AND FAX TO   [facility]: [phone].  If you are unable to process this request in 24 hours please contact our office: [phone].  ___ I have read the above report and request that my patient continue as recommended.   ___ I have read the above report and request that my patient continue therapy with the following changes/special instructions:_________________________________________________________   ___ I have read the above report and request that my patient be discharged from therapy.     Physician Signature:        Date:       Time:

## 2021-04-16 NOTE — Progress Notes (Signed)
 PT DAILY TREATMENT NOTE 8-14    Patient Name: [name]  Date:04/16/2021  DOB: Nov 24, 1985  [x]   Patient DOB Verified  Payor: Advertising copywriter MEDICAID / Plan: [facility] MEDICAID COMMUNITY HEALTH PLAN / Product Type: Managed Care Medicaid /    In time: 5:51 pm              Out time: 6:30 pm  Total Treatment Time (min): 39  Visit #: 3 of 10    Treatment Area:   Other low back pain [M54.59]    SUBJECTIVE  Pain Level (0-10 scale): 4 in left hip and front of thigh  Any medication changes, allergies to medications, adverse drug reactions, diagnosis change, or new procedure performed?: [x]  No    []  Yes (see summary sheet for update)  Subjective functional status/changes:   []  No changes reported  I haven't been here because I had a miscarriage. The exercises have been helping. I do them first before I go to pain medications because I prefer not to take them.    OBJECTIVE    Modality rationale: decrease pain and increase tissue extensibility to improve the patient's ability to perform ADLs and rest comfortably following therapy.   Min Type Additional Details    []  Estim:  [] Unatt       [] IFC  [] Premod                        [] Other: [] w/ice   [] w/heat  Position:   Location:     []  Estim: [] Att    [] TENS instruct  [] NMES                    [] Other: [] w/US    [] w/ice   [] w/heat  Position:  Location:     []   Traction: []  Cervical       [] Lumbar                       []  Prone          [] Supine                       [] Intermittent   [] Continuous Lbs:  []  before manual  []  after manual     []   Ultrasound: [] Continuous   []  Pulsed                           []   [] W/cm2:  Location:     []   Iontophoresis with dexamethasone        Location: []  Take home patch  []  In clinic   PD  []   Ice     []   heat  []   Ice massage  []   Laser  []   Anodyne Position:   Location:      []   Laser with stim  []   Other: Position:  Location:     []   Vasopneumatic Device    []   Right     []   Left  Pre-treatment girth:  Post-treatment  girth:  Measured at (location): Pressure:       []  lo []  med []  hi  Temperature: []  lo []  med []  hi   [x]  Skin assessment post-treatment:  [x] intact [] redness- no adverse reaction    [] redness - adverse reaction    19 min Therapeutic Exercise:  [x]  See flow sheet: reassessment   Rationale: increase ROM  and increase strength to improve trunk strength/mobility to improve ease of ADLs, household chores, and gait.     8 min Therapeutic Activity:  [x]   See flow sheet: bridging without valsalva   Rationale: increase strength, improve coordination, improve balance, and increase proprioception to improve the patient's ability to perform transfers, stair negotiation, and functional carries.     12 min Neuromuscular Re-education:  [x]   See flow sheet:   Rationale: increase strength, improve coordination, improve balance and increase proprioception to improve the patient's ability to perform functional tasks with improved abdominal brace utilization.                                                                  With   [x]  TE   []  TA   []  neuro   []  other: Patient Education: [x]  Review HEP; education on side glides to the (L) towards wall to modify positioning to ensure stretch across left flank and SIJ region; patient encouraged to perform this as many times as necessary to reduce left sided thigh and hip pain         Other Objective/Functional Measures:   ROM % AROM Comments:pain, area   Forward flexion  Fingertips to infrapatellar     Extension Dec 25% ERP   SB right  Dec 50%     SB left Dec 50%     Rotation right Dec 50%  Stiffness   Rotation left Dec 50%  Stiffness      Dural Mobility:  Seated slump test: (+) left for pain, pain with slump  Palpation (L) SIJ , glut and lateral hip tightness   Strength  Hip: unable to test today  Core: bridge decreased 75%     Sacroilliac:     Long sit test + left short; left upslip, likely muscular in nature    Flexibility:                              Piriformis (seated figure 4):    +  B (L greater than R)                           HS + B     Pain Level (0-10 scale) post treatment: 2 in left thigh    ASSESSMENT/Changes in Function:   See PN.    Patient will continue to benefit from skilled PT services to modify and progress therapeutic interventions, address functional mobility deficits, address ROM deficits, address strength deficits, analyze and address soft tissue restrictions, analyze and cue movement patterns, analyze and modify body mechanics/ergonomics, assess and modify postural abnormalities, address imbalance/dizziness, and instruct in home and community integration to attain remaining goals.     []   See Plan of Care  [x]   See progress note/recertification (04/16/21)  []   See Discharge Summary         Progress towards goals / Updated goals:  1.  Patient will increase FOTO score to 62/100 for indications of increased functional mobility.   Status at last assessment: 44/100   Current: goal progressing; 52/100  2.  Patient will demo negative lateral shift for improved sitting posture at work   Status  at last assessment: + L shift   Current: goal progressing; min (L) shift  3. Patient will demo LS flexion fingertip to mid shin for ease with LE dressing   Status at last assessment: unilateral and guarded to knees   Current: goal progressing; bilateral flexion fingertips to infrapatellar region  4. Patient will demo 50% bridge for improved core stability with bending activities   Status at last assessment: 10%  Current: goal progressing; 25% AROM after cueing to avoid valsalva    PLAN  [x]   Upgrade activities as tolerated     [x]   Continue plan of care  []   Update interventions per flow sheet       []   Discharge due to:_  [x]   Other: see PN; cont 2-3x4 for 8-10 sessions; patient to contact office on 04/17/21 AM to schedule due to scheduler being out of office at this time    Jonathan M Quiazon, PTA 04/16/2021    No future appointments.

## 2021-04-21 ENCOUNTER — Inpatient Hospital Stay: Admit: 2021-04-21 | Payer: MEDICAID

## 2021-04-21 DIAGNOSIS — M5459 Other low back pain: Secondary | ICD-10-CM

## 2021-04-21 NOTE — Progress Notes (Signed)
 PT DAILY TREATMENT NOTE 8-14    Patient Name: [name]  Date:04/21/2021  DOB: 02/09/1986  [x]   Patient DOB Verified  Payor: Advertising copywriter MEDICAID / Plan: [facility] MEDICAID COMMUNITY HEALTH PLAN / Product Type: Managed Care Medicaid /     In time: 11:31 am              Out time: 12:28 pm  Total Treatment Time (min): 57  Visit #: 1 of 10    Treatment Area:   Other low back pain [M54.59]    SUBJECTIVE  Pain Level (0-10 scale): 2  Any medication changes, allergies to medications, adverse drug reactions, diagnosis change, or new procedure performed?: [x]  No    []  Yes (see summary sheet for update)  Subjective functional status/changes:   []  No changes reported  Patient notes having a better day today in regards to pain levels. Notes she has been performing her side glides at home with significant relief.    OBJECTIVE    Modality rationale: decrease pain and increase tissue extensibility to improve the patient's ability to perform ADLs and rest comfortably following therapy.   Min Type Additional Details    []  Estim:  [] Unatt       [] IFC  [] Premod                        [] Other: [] w/ice   [] w/heat  Position:   Location:     []   Traction: []  Cervical       [] Lumbar                       []  Prone          [] Supine                       [] Intermittent   [] Continuous Lbs:  []  before manual  []  after manual    []   Ultrasound: [] Continuous   []  Pulsed                           []   [] W/cm2:  Location:   10 []   Ice     [x]   Heat   Position: semi-reclined with LEs elevated on wedge  Location: L/S    [x]  Skin assessment post-treatment:  [x] intact [] redness- no adverse reaction    [] redness - adverse reaction    8 min Therapeutic Exercise:  [x]  See flow sheet:    Rationale: increase ROM and increase strength to improve trunk strength/mobility to improve ease of ADLs, household chores, and gait.     11 min  Therapeutic Activity:  [x]   See flow sheet:    Rationale: increase strength, improve coordination, improve  balance, and increase proprioception to improve the patient's ability to perform transfers, stair negotiation, and functional carries.     18 min  Neuromuscular Re-education:  [x]   See flow sheet: added supine march and BET minisquats   Rationale: increase strength, improve coordination, improve balance and increase proprioception to improve the patient's ability to perform functional tasks with improved abdominal brace utilization.     10 min Gait Training:  30 feet x 2 of the following:    [x]  Banded March          []  Lateral                   []  Horizontal  HT  []  Tandem         [x]  Banded Lateral     []  Vertical HT  []  Retrograde    []  Banded Monster   []  Dual Task  []  Hurdles          []  Mario                    []  Ladder Drills  []  Heel Walk      [x]  Static skier walk with RTB   Rationale: improve safety with household/community ambulation.                                                                   With   [x]  TE   [x]  TA   [x]  neuro   []  other: Patient Education: [x]  Review HEP       Other Objective/Functional Measures:       Pain Level (0-10 scale) post treatment: 1    ASSESSMENT/Changes in Function:   Patient with difficulty achieving sufficient complementary hip flexion during minisquats to avoid increased anterior tibial excursion. Min discomfort in the (L) flank with LTRs to the right, therefore, discussed importance of performing within stretch ROM with goal of improving mobility via repetitions.     Patient will continue to benefit from skilled PT services to modify and progress therapeutic interventions, address functional mobility deficits, address ROM deficits, address strength deficits, analyze and address soft tissue restrictions, analyze and cue movement patterns, analyze and modify body mechanics/ergonomics, assess and modify postural abnormalities, address imbalance/dizziness, and instruct in home and community integration to attain remaining goals.     []   See Plan of Care  [x]   See  progress note/recertification (04/16/21)  []   See Discharge Summary         Progress towards goals / Updated goals:  1.  Patient will increase FOTO score to 62/100 for indications of increased functional mobility.   Status at last assessment: 52/100  2.  Patient will demo negative lateral shift for improved sitting posture at work   Status at last assessment: min (L) shift  3. Patient will demo LS flexion fingertip to mid shin for ease with LE dressing   Status at last assessment: bilateral flexion fingertips to infrapatellar region  4. Patient will demo 50% bridge for improved core stability with bending activities   Status at last assessment: 25% AROM after cueing to avoid valsalva  Current: goal progressing; 50% bridge (04/21/21)    PLAN  [x]   Upgrade activities as tolerated     [x]   Continue plan of care  []   Update interventions per flow sheet       []   Discharge due to:_  []   Other:_    Dorn CHRISTELLA Semen, PTA 04/21/2021    Future Appointments   Date Time Provider Department Center   04/24/2021  8:30 AM Alona Doffing, PT [facility]   04/28/2021 11:30 AM Semen Dorn CHRISTELLA, PTA [facility]   05/01/2021  9:15 AM Alona Doffing, PT [facility]   05/05/2021 11:30 AM [facility]   05/08/2021  9:15 AM Alona Doffing, PT [facility]   05/12/2021  8:30 AM Alona Doffing, PT [facility]   05/15/2021 10:00 AM  Alona Doffing, PT [facility]

## 2021-04-24 ENCOUNTER — Inpatient Hospital Stay: Admit: 2021-04-24 | Payer: MEDICAID

## 2021-04-24 NOTE — Progress Notes (Signed)
 Progress  Notes by Alona Doffing, PT at 04/24/21 0749                Author: Alona Doffing, PT  Service: Physical Therapy  Author Type: Physical Therapist       Filed: 04/24/21 0939  Date of Service: 04/24/21 0749  Status: Signed          Editor: Alona Doffing, PT (Physical Therapist)               PT DAILY TREATMENT NOTE 8-14      Patient Name: [name]   Date:04/24/2021   DOB: [birth year]   [x]   Patient DOB Verified   Payor: [employer] MEDICAID / Plan: [facility] MEDICAID COMMUNITY HEALTH PLAN / Product Type: Managed Care Medicaid /      In time: 834            Out time: 930   Total Treatment Time (min): 56   Visit #: 2 of 10      Treatment Area:    Other low back pain [M54.59]      SUBJECTIVE   Pain Level (0-10 scale): 5   Any medication changes, allergies to medications, adverse drug reactions, diagnosis change, or new procedure performed?: [x]   No    []  Yes (see summary sheet for update)   Subjective functional status/changes:   []   No changes reported   I didn't sleep too good, yesterday when I was showering my knees buckled twice. Pt notes incr stress.       OBJECTIVE         Modality rationale:  decrease pain and increase tissue extensibility to improve the patients ability to perform ADLs and rest comfortably following therapy.         Min  Type  Additional Details           []   Estim:  []  Unatt       []  IFC  []  Premod                         []  Other:  []  w/ice   []  w/heat   Position:    Location:        []    Traction: []   Cervical       []  Lumbar                        []   Prone          []  Supine                        []  Intermittent   []  Continuous  Lbs:   []   before manual   []   after manual           []    Ultrasound: []  Continuous   []   Pulsed                            []    []   W/cm2:   Location:         10  []    Ice     [x]    Heat     Position: supine with LEs elevated on wedge   Location: L/S      [x]   Skin assessment post-treatment:  [x] intact [] redness- no adverse  reaction     []  redness -  adverse reaction          15  min  Therapeutic Exercise:  [x]   See flow sheet:    -added Piriformis stretch in supine   -added SLR in supine     Rationale: increase ROM and increase strength to improve trunk strength/mobility  to improve ease of ADLs, household chores, and gait.           10  min   Therapeutic Activity:  [x]    See flow sheet:    -added YTB for hip abd in supine bridges     Rationale: increase strength, improve coordination, improve balance,  and increase proprioception to improve the patient's ability to perform transfers, stair negotiation, and functional carries.           20  min   Neuromuscular Re-education:  [x]    See flow sheet:    -added clamshells I-III unresisted on R, clams I on L     Rationale: increase strength, improve coordination, improve balance  and increase proprioception to improve the patients ability to perform functional tasks with improved abdominal brace utilization.           NT  min  Gait Training:  30 feet x 2 of the following:      [x]  Banded March          []  Lateral                   []   Horizontal HT   []  Tandem         [x]  Banded Lateral     []   Vertical HT   []  Retrograde    []  Banded Monster   []   Dual Task   []  Hurdles          []  Mario                    []   Ladder Drills   []  Heel Walk      [x]  Static skier walk with RTB     Rationale: improve safety with household/community ambulation.                                                                         With    [x]   TE    [x]  TA    [x]  neuro    []  other:  Patient Education: [x]   Review HEP   Perform side glides throughout the day, every day, for improvement of sx           Other Objective/Functional Measures:          Pain Level (0-10 scale) post treatment: 0/10      ASSESSMENT/Changes in Function:    Program regressed today to bed level only secondary to pt incr c/o pain and stress. Pt reports having miscarriage in September, likely underlying mechanism of stress affecting sleep. Pt  notes pain with clams II-III and piriformis IR stretch on L, as well  as frequent groin pain; pt given edu on hallmark sx of hip OA and recommendation to request imaging for dx. MHP at end of session, pt notes decr pain levels to zero.       Patient will continue to benefit  from skilled PT services to modify and progress therapeutic interventions, address functional mobility deficits, address ROM deficits, address strength deficits, analyze and address soft tissue restrictions, analyze and  cue movement patterns, analyze and modify body mechanics/ergonomics, assess and modify postural abnormalities, address imbalance/dizziness, and instruct in home and community integration to attain remaining goals.       []   See Plan of Care   []   See progress note/recertification (04/16/21)   []    See Discharge Summary           Progress towards goals / Updated goals:   1.  Patient will increase FOTO score to 62/100 for indications of increased functional mobility.    Status at last assessment: 52/100   2.  Patient will demo negative lateral shift for improved sitting posture at work    Status at last assessment: min (L) shift   Current 04/24/21 pt notes decr of pain sx with L side glide at wall   3. Patient will demo LS flexion fingertip to mid shin for ease with LE dressing    Status at last assessment: bilateral flexion fingertips to infrapatellar region   4. Patient will demo 50% bridge for improved core stability with bending activities    Status at last assessment: 25% AROM after cueing to avoid valsalva   Current: goal progressing; 50% bridge (04/21/21)      PLAN   [x]   Upgrade activities as tolerated     [x]   Continue plan of care   []    Update interventions per flow sheet        []   Discharge due to:_   []   Other:_      Leita Bellman, PT 04/24/2021        Future Appointments           Date  Time  Provider  Department  Center           04/24/2021   8:30 AM  Bellman Leita, PT  [facility]  [facility]     04/28/2021  11:30 AM  Griselda Dorn HERO, PTA  [facility]  [facility]     05/01/2021   9:15 AM  Bellman Leita, PT  [facility]  [facility]     05/05/2021  11:30 AM  [facility] PT [facility]  [facility]  [facility]     05/08/2021   9:15 AM  Bellman Leita, PT  [facility]  [facility]     05/12/2021   8:30 AM  Bellman Leita, PT  [facility]  [facility]           05/15/2021  10:00 AM  Bellman Leita, PT  [facility]  [facility]

## 2021-04-24 NOTE — Progress Notes (Signed)
[facility]   [address], [place], [place] [address]  Phone: [phone] Fax [phone]  DISCHARGE SUMMARY  Patient Name: [name] DOB: Sep 20, 1985   Treatment/Medical Diagnosis: Other low back pain [M54.59]   Referral Source: Tonia Brooms, MD     Date of Initial Visit: 03/27/21 Attended Visits: 5 Missed Visits: 4     SUMMARY OF TREATMENT  Pt is a 35 y.o. year old female who presents with co LS pain, numbness into the L buttock and intermittent shooting pain from hip to knee and numbness into the toes sp fall at work on 02/17/2021 sec to ceiling tile falling on her. Treatment has consisted of the following:  Therapeutic exercise, Therapeutic activities, Neuromuscular re-education, Physical agent/modality, Gait/balance training, Patient education, Self Care training, Functional mobility training, Home safety training, and Stair training.     CURRENT STATUS  Patient attended 5 sessions with intermittent compliance with attendance secondary to miscarriage and personal reasons. Patient demonstrated some progress towards goals as well as subjective ability to self-manage sx. Pt failed to attend remaining scheduled appointments and therefore is being discharged from our services. Unable to complete a formal re-assessment due to lack of compliance to appointments. See below for goals updated at last treatment session. Please send new consult if further therapy should be indicated.      Progress towards goals: Did not meet therapeutic goals d/t lack of compliance with attendance.   1.  Patient will increase FOTO score to 62/100 for indications of increased functional mobility.   Status at last assessment: 52/100  2.  Patient will demo negative lateral shift for improved sitting posture at work   Status at last assessment: min (L) shift  Current 04/24/21 pt notes decr of pain sx with L side glide at wall  3. Patient will demo LS flexion fingertip  to mid shin for ease with LE dressing   Status at last assessment: bilateral flexion fingertips to infrapatellar region  4. Patient will demo 50% bridge for improved core stability with bending activities   Status at last assessment: 25% AROM after cueing to avoid valsalva  Current: goal progressing; 50% bridge (04/21/21)       RECOMMENDATIONS  Discontinue therapy due to lack of attendance or compliance.      If you have any questions/comments please contact us directly at [phone].   Thank you for allowing [place] to assist in the care of your patient.    To ensure we are able to process the patient's encounter and avoid risk of your patient receiving a bill for our services, please sign and return this discharge summary by 07/31/2021. (30days following DC date)    Therapist Signature: Sharon Seller, PT Date: 07/01/21   Reporting Period: 03/27/21 - 07/01/21 Time: 12:24 PM   NOTE TO PHYSICIAN:  Your patient's insurance requires this discharge note be signed and returned.  PLEASE COMPLETE THE ORDERS BELOW AND RETURN TO:  [facility]    ___ I have read the above report and request that my patient be discharged from therapy.     Physician Signature:        Date:       Time:  Tonia Brooms, MD

## 2021-04-28 ENCOUNTER — Inpatient Hospital Stay: Payer: MEDICAID

## 2021-11-11 NOTE — ED Provider Notes (Signed)
 [facility] EMERGENCY DEPARTMENT    Time of Arrival:   11/11/21 1610        Final diagnoses:   [M54.2] Neck pain, bilateral (Primary)   [M54.9] Upper back pain   [R20.0, R20.2] Numbness and tingling in both hands   [E83.42] Hypomagnesemia       Medical Decision Making:        36 year old female with history of obesity, hypertension and diabetes presenting with several day history of bilateral upper extremity pain and numbness tingling.  Vital signs are reassuring, physical exam is most consistent with musculoskeletal etiology be it radicular symptoms versus muscle spasms.  Physical exam may also indicate cubital tunnel plus or minus carpal tunnel syndrome which is consistent with the patient's hand pain and numbness.  Though we cannot exclude intracranial pathology or cervical spine pathology with her headache and vision changes.  Given this, we will obtain lab work-up and evaluate for electrolyte dysfunction and get CT imaging of her head and cervical spine to rule out other concerning features.    Reassuring lab work-up today, no leukocytosis, no anemia, no electrolyte abnormality CT imaging is also reassuring, see below, no acute intracranial or cervical pathology identified.    I did discuss the findings with the patient as well as her incidentals, also discussed all his is likely musculoskeletal etiology and discussed methods of improving her tension and pain.  She did have some benefit from analgesia given in the ER.  We will move forward with Voltaren gel and Tylenol at home.  Patient verbalized agreement and gratitude for the plan.  Patient is also to follow-up with primary care physician for possible referral to physical therapy for improved mobility and stretching.  I also discussed how her job may be causing some symptoms of cubital tunnel and carpal tunnel syndromes and patient states that cubital tunnel is likely from her hair brushing and carpal tunnel likely from her typing.  I discussed  postural exercises as well today.  Patient verbalized understanding to all.  She relies intending patient to follow-up in take medications as prescribed.  Patient to be discharged in good condition with strong return precautions and follow-up instructions.     Total (NIH Stroke Scale): 1                         Glasgow Coma Scale Score: 15            Supplemental Historians include:  patient         ED Course as of 11/11/21 1022   Tue Nov 11, 2021   0920 MAGNESIUM SERUM(!)  Mild hypomagnesemia, repleted in the ER today [MP]   0921 CT CERVICAL SPINE W/O CONTRAST  Evidence of acute fracture or injury by my interpretation.  Radiology reads mild degenerative disc disease without other significant finding [MP]   [phone] ED CT HEAD NO CONTRAST  No acute intracranial hemorrhage  by my interpretation [MP]                Documentation/Prior Results Review:  Old medical records    Rhythm interpretation from monitor: normal sinus rhythm    Imaging Interpreted by me: CT See below    ED CT HEAD NO CONTRAST   Final Result   1. Mild left ethmoid sinus disease.  Otherwise negative CT head without IV contrast       Signed By: Romilda Coaster, MD on 11/11/2021 9:08 AM  CT CERVICAL SPINE W/O CONTRAST   Final Result   1. Mild DDD at C5-C6 and C6-C7         Signed By: Romilda Coaster, MD on 11/11/2021 9:12 AM             .     Discussion of Mangement with other Physicians, QHP or Appropriate Source:   None    .    The differential diagnosis and / or critical care lists were considered including infections, sepsis, severe sepsis, and septic shock and found unlikely unless otherwise documented in the final clinical impression or diagnosis list.     Disposition:  Home      Discharge Medication List as of 11/11/2021  9:35 AM        START taking these medications    Details   diclofenac sodium (VOLTAREN ARTHRITIS PAIN) 1 % Top GEL Use 2 g to affected area 4 Times Daily., Disp-50 g, R-0, Normal           Chief Complaint   Patient presents with    . HEADACHE (NEW ONSET OR NEW SYMPTOMS)       HPI   Patient is a 36 year old female past with history of CHF status post defibrillator placement, hypertension, diabetes presenting today with chief complaint of bilateral arm and hand pain and intermittent numbness.  She states this began 5 days ago as arm pain centered around her upper back and has some intermittent numbness and tingling of her fingertips.  She states yesterday her right index finger went completely numb and has not improved.  She endorses some intermittent arm numbness with certain positions, especially with elbows and shoulders raised.  She also endorses some headache recently she feels radiates from her shoulders up to her head.  She endorses some blurry vision stating she now wears her glasses all the time as well as some spots in her vision.  She denies any trauma or injuries to her neck recently.  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  She denies any weakness.  Denies any vision loss, chest pain, shortness of breath, nausea, vomiting, changes in bowel or bladder habits.  She denies prior occurrence of this.   Review of Systems    Physical Exam  Awake, alert, no acute distress  Vital signs within normal limits  Regular rate and rhythm without murmurs rubs or gallops  Lungs clear to auscultation bilaterally  Abdomen soft nontender  +2 radial pulses bilaterally  PERRL, EOMI, cranial nerves II through XII grossly intact bilaterally.  There is subjective report of altered sensation to the right face and right upper extremity described as feeling "more intense" sensation is otherwise intact in the bilateral lower extremities, muscle strength is equal and intact in bilateral upper and lower extremities, no pronator drift, no tongue deviation.  Positive Tinel's at the cubital tunnel and the carpal tunnel on the right, positive Tinel's on the left, recreation of symptoms  with Spurling maneuver as well as palpation of the paraspinals and rhomboids  Past Medical History:   Diagnosis Date   . Adverse effect of anesthesia 2009    per note in Care Everywhere, pt reports awareness during AICD implantation   . Cardiomyopathy (HCC)    . Congestive heart failure, unspecified    . Diabetes mellitus (HCC)     type II- no longer taking meds   . Exercise tolerance  finding 05/24/2020    denies cp/ sob with climbing stairs   . Gout, unspecified    . Hypertension    . Hypertriglyceridemia    . 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 IUFD at less than 20 weeks of gestation 04/2020   . Morbidly obese (HCC)    . Nonischemic cardiomyopathy (HCC)    . PCOS (polycystic ovarian syndrome)     pt denies   . Syncope      Past Surgical History:   Procedure Laterality Date   . AICD IMPLANTATION  07/21/2007   . CARDIAC ABLATION  04/06/2017   . DILATION & EVACUATION N/A 05/28/2020    Procedure: DILATION AND EVACUATION, UTERUS;  Surgeon: Gail Joseph, MD   . HEART CATHETERIZATION  2005   . IMPLANTED DEFIBRILLATOR  09/03/2016    GENERATOR CHANGE--2357-40C Fortify [address] DR; serial # [id number]     Family History   Problem Relation Age of Onset   . Milk Intolerance Mother    . Vision Problems Mother    . Migraines Mother    . Heart Disease Other    . Hypertension Other    . Diabetes Other    . Stroke Other      Social History     Occupational History   . Not on file   Tobacco Use   . Smoking status: Former     Packs/day: 0.50     Years: 4.00     Pack years: 2.00     Types: Cigarettes     Quit date: 06/17/2013     Years since quitting: 8.4   . Smokeless tobacco: Never   Substance and Sexual Activity   . Alcohol use: Not Currently     Alcohol/week: 0.0 standard drinks     Comment: rarely   . Drug use: Yes     Comment: occ marijuana   . Sexual activity: Not Currently     Outpatient Medications Marked as Taking for the  11/11/21 encounter [facility] Encounter)   Medication Sig Dispense Refill   . diclofenac sodium (VOLTAREN ARTHRITIS PAIN) 1 % Top GEL Use 2 g to affected area 4 Times Daily. 50 g 0     Allergies   Allergen Reactions   . Aldactone [Spironolactone] hives   . Latex rash/itching       Vital Signs:  Patient Vitals for the past 72 hrs:   Temp Heart Rate Pulse Resp BP BP Mean SpO2 Weight   11/11/21 0800 -- 78 79 24 121/68 79 MM HG 98 % --   11/11/21 0748 98.8 F (37.1 C) -- -- -- -- -- -- --   11/11/21 0744 -- -- -- -- -- -- -- 91.2 kg (201 lb)   11/11/21 0719 -- 94 -- -- -- -- 100 % --       Diagnostics:  Labs:    Results for orders placed or performed during the hospital encounter of 11/11/21   COMPREHENSIVE METABOLIC PANEL   Result Value Ref Range    Potassium 4.5 3.5 - 5.5 mmol/L    Sodium 138 133 - 145 mmol/L    Chloride 106 98 - 110 mmol/L    Glucose 155 (H) 70 - 99 mg/dL    Calcium 16.1 8.4 - 09.6 mg/dL    Albumin 4.2 3.5 - 5.0 g/dL    SGPT (ALT) 13 5 - 40 U/L  SGOT (AST) 16 10 - 37 U/L    Bilirubin Total 0.2 0.2 - 1.2 mg/dL    Alkaline Phosphatase 60 25 - 115 U/L    BUN 8 6 - 22 mg/dL    CO2 22 20 - 32 mmol/L    Creatinine 0.6 0.5 - 1.2 mg/dL    eGFR >16.1 >09.6 EA/VWU/9.81 sq.m.    Globulin 3.7 2.0 - 4.0 g/dL    A/G Ratio 1.1 1.1 - 2.6 ratio    Total Protein 7.9 6.4 - 8.3 g/dL    Anion Gap 19.1 3.0 - 15.0 mmol/L   MAGNESIUM SERUM   Result Value Ref Range    Magnesium 1.5 (L) 1.6 - 2.5 mg/dL   CBC WITH DIFFERENTIAL AUTO   Result Value Ref Range    WBC 9.8 4.0 - 11.0 K/uL    RBC 4.25 3.80 - 5.20 M/uL    HGB 12.5 11.7 - 15.5 g/dL    HCT 47.8 29.5 - 62.1 %    MCV 90 80 - 99 fL    MCH 29 26 - 34 pg    MCHC 33 31 - 36 g/dL    RDW 30.8 65.7 - 84.6 %    Platelet 319 140 - 440 K/uL    MPV 9.4 9.0 - 13.0 fL    Segmented Neutrophils (Auto) 64 40 - 75 %    Lymphocytes (Auto) 20 20 - 45 %    Monocytes (Auto) 8 3 - 12 %    Eosinophils (Auto) 8 (H) 0 - 6 %    Basophils (Auto) 1 0 - 2 %    Absolute Neutrophils (Auto) 6.2 1.8 -  7.7 K/uL    Absolute Lymphocytes (Auto) 2.0 1.0 - 4.8 K/uL    Absolute Monocytes (Auto) 0.7 0.1 - 1.0 K/uL    Absolute Eosinophils (Auto) 0.8 (H) 0.0 - 0.5 K/uL    Absolute Basophils (Auto) 0.1 0.0 - 0.2 K/uL     ECG:  No results found for this visit on 11/11/21.    Medications ordered/given in the ED  Medications   acetaminophen (TylenoL) tablet 1,000 mg (1,000 mg Oral Given 11/11/21 0836)   ketorolac (ToradoL) injection 15 mg (15 mg Intravenous Given 11/11/21 0836)   magnesium oxide (Mag-Ox) tablet 400 mg (400 mg Oral Given 11/11/21 0947)

## 2021-11-11 NOTE — ED Provider Notes (Signed)
 ED Resident Supervision Note    I have seen and evaluated [name] and agree with the provider's findings (exceptions, if any, noted below).  I reviewed and directed the Emergency Department treatment given to [name]     A/P: 36 year old female presenting with intermittent arm pain, numbness, headache.  Work-up is overall reassuring.  Neurologically intact and well-appearing.  Symptoms resolved on evaluation.  Stable for discharge home with PCP follow-up.    Critical Care Time:   N/A    Procedures:  N/A    S:  36 y.o. female  with a chief complaint of HEADACHE (NEW ONSET OR NEW SYMPTOMS)      O:  Patient Vitals for the past 72 hrs:   Temp Heart Rate Pulse Resp BP BP Mean SpO2 Weight   11/11/21 0800 -- 78 79 24 121/68 79 MM HG 98 % --   11/11/21 0748 98.8 F (37.1 C) -- -- -- -- -- -- --   11/11/21 0744 -- -- -- -- -- -- -- 91.2 kg (201 lb)   11/11/21 0719 -- 94 -- -- -- -- 100 % --      Joye Nobles, MD  11/11/2021, 7:45 AM    Note:  This was dictated using a computer transcription program.  Although proofread, it may contain computer transcription errors or phonetic errors.  Other human proofreading errors may exist.  Corrections may be performed at a later time.  Please contact for any clarification if needed.

## 2022-02-01 NOTE — ED Provider Notes (Signed)
 [facility] EMERGENCY DEPARTMENT     Time of Arrival:   02/01/22 1307         Final diagnoses:   [O20.0] Threatened abortion, antepartum (Primary)   [N30.01] Acute cystitis with hematuria         Medical Decision Making:      Differential Diagnosis:   Threatened AB, missed AB, electrolyte abnormality, dehydration, UTI, STI,                                          ED Course/Consults: 1 L LR, 4 mg IV Zofran ordered  Patient appears dehydrated, though she has a history of CHF lungs are clear and there is no sign of fluid overload.        4:46 PM patient's WBC 16.4 increased from 9.8 on 11/11/2021, H&H are normal and there is no anemia, platelets are normal.  BMP reviewed, patient's blood glucose 170 slight increase from 155 on 11/11/2021 other renal functions and electrolytes are reassuring.  Magnesium level is normal  Patient's proBNP 2002, wet prep shows PMNs only    5:11 PM MCR called they are working to get the images to radiologist to read    5:32 PM discussed with patient all results, she is aware to return for beta-hCG redraw in 48 hours, sooner if worsening symptoms, Macrobid as directed, push fluids, over-the-counter Tylenol as needed for pain, strict pelvic rest no sex or tampons till cleared by OB, questions answered she agrees with plan, work note give for 3 days    Documentation/Prior Results Review:  Old medical records  Patient's blood type O+ on 05/24/2020  Last EF on 10/22/2013 22%    Imaging Interpreted by me: US  pt has questionable intrauterine yolk early pregnancy awaiting radiology overread    US  OB COMPLETE < 14 WKS W/ TV   Final Result      1.  IUP confirmed.  Too small for confident detection of fetal cardiac activity.  EGA by ultrasound estimated at 5 weeks 6 days.  Recommend correlation with serial serum beta-hCG.      2.  Small cystic focus in the right ovary, nonspecific.  Difficult to demonstrate blood flow in the right ovary, most likely related to technical factors.  The small size of the  right ovary makes it unlikely or the presence of torsion.      3.  Grossly unremarkable left ovary.      Signed By: Annitta Basil, MD on 02/01/2022 5:23 PM             The differential diagnosis and / or critical care lists were considered including infections, sepsis, severe sepsis, and septic shock and found unlikely unless otherwise documented in the final clinical impression or diagnosis list.     Disposition:  Home    .    New Prescriptions    NITROFURANTOIN MONOHYDRATE MACRO (MACROBID) 100 MG PO CAPS    Take 1 Cap by Mouth Twice Daily for 7 days.     Chief Complaint   Patient presents with   . ABDOMINAL PAIN   . VOMITING   . DIZZINESS     Yo G6P0 [redacted] weeks pregnant presents with nausea, vomiting, diarrhea.  Patient reports that she ate [employer] last night when she started vomiting, her partner also ate [employer] but he is not vomiting.  She has had nausea vomiting diarrhea since.  She reports crampy pelvic pain that does not radiate  Denies hematochezia or hematemesis  Patient has not taken any medication for the symptoms  Followed by [facility]  Has a history of a cardiac ablation and a pacemaker says she has never been able to carry a pregnancy  Past the first trimester  Denies any bleeding, vaginal discharge, dysuria but does note urinary frequency.      History provided by:  Patient  Language interpreter used: No    ABDOMINAL PAIN  VOMITING  DIZZINESS      Review of Systems   See HPI above    Physical Exam  Vitals and nursing note reviewed. Exam conducted with a chaperone present (Nurse Louanna Rouse).   HENT:      Head: Normocephalic and atraumatic.      Right Ear: External ear normal.      Left Ear: External ear normal.   Eyes:      Extraocular Movements: Extraocular movements intact.   Cardiovascular:      Rate and Rhythm: Normal rate and regular rhythm.   Pulmonary:      Effort: Pulmonary effort is normal. No respiratory distress.      Breath sounds: Normal breath sounds. No wheezing.   Abdominal:       General: Bowel sounds are normal.      Palpations: Abdomen is soft.      Tenderness: There is abdominal tenderness in the suprapubic area.       Genitourinary:     General: Normal vulva.      Vagina: Vaginal discharge present. No tenderness. vaginal discharge consistency:  Thin, vaginal discharge odor: none, vaginal discharge color:  Clear.       Cervix: Normal.      Uterus: Enlarged and tender.       Adnexa:         Right: No mass or tenderness.          Left: No mass or tenderness.     Musculoskeletal:         General: Normal range of motion.      Cervical back: Normal range of motion and neck supple.   Skin:     General: Skin is warm and dry.      Comments: Shaved pubic area no redness, vesicles, or rashes   Neurological:      General: No focal deficit present.      Mental Status: She is alert.      Gait: Gait normal.   Psychiatric:         Mood and Affect: Mood normal.         Behavior: Behavior normal.       Past Medical History:   Diagnosis Date   . Adverse effect of anesthesia 2009    per note in Care Everywhere, pt reports awareness during AICD implantation   . Cardiomyopathy (HCC)    . Congestive heart failure, unspecified    . Diabetes mellitus (HCC)     type II- no longer taking meds   . Exercise tolerance finding 05/24/2020    denies cp/ sob with climbing stairs   . Gout, unspecified    . Hypertension    . Hypertriglyceridemia    . 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 [employer] 2207 dual, generator change 2018--[facility]--Fortify Assura DR   . IUFD at less than 20 weeks of gestation 04/2020   . Morbidly obese (HCC)    .  Nonischemic cardiomyopathy (HCC)    . PCOS (polycystic ovarian syndrome)     pt denies   . Syncope      Past Surgical History:   Procedure Laterality Date   . AICD IMPLANTATION  07/21/2007   . CARDIAC ABLATION  04/06/2017   . DILATION & EVACUATION N/A 05/28/2020    Procedure: DILATION AND EVACUATION, UTERUS;  Surgeon: Gail Joseph, MD   . HEART CATHETERIZATION   2005   . IMPLANTED DEFIBRILLATOR  09/03/2016    GENERATOR CHANGE--2357-40C Fortify Assura DR; serial # [id number]     Family History   Problem Relation Age of Onset   . Milk Intolerance Mother    . Vision Problems Mother    . Migraines Mother    . Heart Disease Other    . Hypertension Other    . Diabetes Other    . Stroke Other      Social History     Occupational History   . Not on file   Tobacco Use   . Smoking status: Former     Packs/day: 0.50     Years: 4.00     Pack years: 2.00     Types: Cigarettes     Quit date: 06/17/2013     Years since quitting: 8.6   . Smokeless tobacco: Never   Substance and Sexual Activity   . Alcohol use: Not Currently     Alcohol/week: 0.0 standard drinks     Comment: rarely   . Drug use: Yes     Comment: occ marijuana   . Sexual activity: Not Currently     Outpatient Medications Marked as Taking for the 02/01/22 encounter [facility] Encounter)   Medication Sig Dispense Refill   . nitrofurantoin monohydrate macro (MACROBID) 100 mg PO CAPS Take 1 Cap by Mouth Twice Daily for 7 days. 14 Cap 0     Allergies   Allergen Reactions   . Aldactone [Spironolactone] hives   . Latex rash/itching       Vital Signs:  Patient Vitals for the past 72 hrs:   Temp Heart Rate Resp BP BP Mean SpO2 Weight   02/01/22 1352 98.3 F (36.8 C) 82 18 145/84 104 MM HG 98 % --   02/01/22 1351 -- -- -- -- -- -- 98 kg (216 lb)   02/01/22 1312 -- 87 18 -- -- 100 % --           Diagnostics:  Labs:    Results for orders placed or performed during the hospital encounter of 02/01/22   WET PREP    Specimen: Vaginal; Various culture specimens   Result Value Ref Range    PMN Very Few (A) None    Motile Trichomonas None None    Yeast None None    Clue Cells None None   BASIC METABOLIC PANEL   Result Value Ref Range    Potassium 3.8 3.5 - 5.5 mmol/L    Sodium 136 133 - 145 mmol/L    Chloride 101 98 - 110 mmol/L    Glucose 170 (H) 70 - 99 mg/dL    Calcium 9.5 8.4 - 04.5 mg/dL    BUN 4 (L) 6 - 22 mg/dL    Creatinine 0.6 0.5 - 1.2  mg/dL    CO2 22 20 - 32 mmol/L    eGFR >60.0 >60.0 mL/min/1.73 sq.m.    Anion Gap 13.0 3.0 - 15.0 mmol/L   MAGNESIUM SERUM   Result Value Ref Range  Magnesium 1.7 1.6 - 2.5 mg/dL   CBC WITH DIFFERENTIAL AUTO   Result Value Ref Range    WBC 16.4 (H) 4.0 - 11.0 K/uL    RBC 4.22 3.80 - 5.20 M/uL    HGB 12.5 11.7 - 15.5 g/dL    HCT 82.9 56.2 - 13.0 %    MCV 88 80 - 99 fL    MCH 30 26 - 34 pg    MCHC 34 31 - 36 g/dL    RDW 86.5 78.4 - 69.6 %    Platelet 305 140 - 440 K/uL    MPV 9.3 9.0 - 13.0 fL    Segmented Neutrophils (Auto) 85 (H) 40 - 75 %    Lymphocytes (Auto) 7 (L) 20 - 45 %    Monocytes (Auto) 8 3 - 12 %    Eosinophils (Auto) 0 0 - 6 %    Basophils (Auto) 0 0 - 2 %    Absolute Neutrophils (Auto) 14.0 (H) 1.8 - 7.7 K/uL    Absolute Lymphocytes (Auto) 1.1 1.0 - 4.8 K/uL    Absolute Monocytes (Auto) 1.3 (H) 0.1 - 1.0 K/uL    Absolute Eosinophils (Auto) 0.0 0.0 - 0.5 K/uL    Absolute Basophils (Auto) 0.1 0.0 - 0.2 K/uL   BETA HCG BLOOD (QUANTITATIVE)   Result Value Ref Range    Beta HCG 2,002 mIU/mL   Urinalysis w Micro Reflex Culture    Specimen: Clean Catch Urine   Result Value Ref Range    Source Urine      Urine Color Yellow Colorless, Pale Yellow, Light Yellow, Yellow, Dark Yellow, Straw    Urine Clarity Turbid (A) Clear, Slightly Cloudy    Urine pH 6.5 5.0 - 8.0 pH    Urine Protein Screen 300* (A) Negative, Trace mg/dL    Urine Glucose 295* (A) Negative mg/dL    Urine Ketones 40* (A) Negative mg/dL    Urine Occult Blood Large (A) Negative    Urine Specific Gravity 1.016 1.005 - 1.030    Urine Nitrite Positive (A) Negative    Urine Leukocyte Esterase Large (A) Negative    Urine Bilirubin Negative Negative    Urine Urobilinogen 1.0 <2.0 mg/dL mg/dL    Urine RBC >284 (A) Negative, 0-2 /hpf    Urine WBC >100 (A) 0 - 5 /hpf    Urine Bacteria Present (A) Negative    Squamous Epithelial Cells 3-5 (A) None, 0-2 /hpf    Hyaline Cast 6-10 (A) 0 - 2 /lpf       Medications given in the ED  Medications   ondansetron (PF)  (Zofran) injection 4 mg (4 mg IV Push Given 02/01/22 1453)   Lactated Ringers (LR) infusion (1,000 mL Intravenous New Bag 02/01/22 1453)   nitrofurantoin monohydrate macro (Macrobid) 100 mg capsule 100 mg (100 mg Oral Given 02/01/22 1700)

## 2022-02-03 NOTE — ED Provider Notes (Signed)
 [facility] EMERGENCY DEPARTMENT     Time of Arrival:   02/03/22 1515         Final diagnoses:   [O20.0] Threatened miscarriage in early pregnancy (Primary)         Medical Decision Making:      Differential Diagnosis:       Threatened AB  Encounter for monitoring                             Glasgow Coma Scale Score: 15            ED Course/Consults:   36 year old female [name] presents to the emergency department for repeat beta-hCG testing.  She was seen here 2 days ago and says she had an ultrasound that showed an IUP but without fetal cardiac activity.  She said this is her sixth pregnancy and the prior ones have all ended in miscarriage.  She says she recently was diagnosed with a UTI and has been on Macrobid and says she is overall feeling much better.  She denies any vaginal bleeding, vaginal discharge nausea or vomiting and she says that the cramping has improved significantly since the nausea and vomiting have stopped.      Vital signs notable for borderline fever    Exam with a nontender, nondistended abdomen      Beta-hCG 2316, barely improved from prior 2002 - concern for miscarriage.       Patient has close follow-up with [facility].                  Documentation/Prior Results Review:  Old medical records    Imaging Interpreted by me: Not Applicable    No orders to display       The differential diagnosis and / or critical care lists were considered including infections, sepsis, severe sepsis, and septic shock and found unlikely unless otherwise documented in the final clinical impression or diagnosis list.     Disposition:  Home    .    New Prescriptions    No medications on file     Chief Complaint   Patient presents with   . BLOOD DRAW     HPI    Review of Systems     Physical Exam    Past Medical History:   Diagnosis Date   . Adverse effect of anesthesia 2009    per note in Care Everywhere, pt reports awareness during AICD implantation   . Cardiomyopathy (HCC)    . Congestive heart failure,  unspecified    . Diabetes mellitus (HCC)     type II- no longer taking meds   . Exercise tolerance finding 05/24/2020    denies cp/ sob with climbing stairs   . Gout, unspecified    . Hypertension    . Hypertriglyceridemia    . 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 IUFD at less than 20 weeks of gestation 04/2020   . Morbidly obese (HCC)    . Nonischemic cardiomyopathy (HCC)    . PCOS (polycystic ovarian syndrome)     pt denies   . Syncope      Past Surgical History:   Procedure Laterality Date   . AICD IMPLANTATION  07/21/2007   . CARDIAC ABLATION  04/06/2017   . DILATION & EVACUATION N/A 05/28/2020    Procedure: DILATION  AND EVACUATION, UTERUS;  Surgeon: Gail Joseph, MD   . HEART CATHETERIZATION  2005   . IMPLANTED DEFIBRILLATOR  09/03/2016    GENERATOR CHANGE--2357-40C Fortify Assura DR; serial # [id number]     Family History   Problem Relation Age of Onset   . Milk Intolerance Mother    . Vision Problems Mother    . Migraines Mother    . Heart Disease Other    . Hypertension Other    . Diabetes Other    . Stroke Other      Social History     Occupational History   . Not on file   Tobacco Use   . Smoking status: Former     Packs/day: 0.50     Years: 4.00     Pack years: 2.00     Types: Cigarettes     Quit date: 06/17/2013     Years since quitting: 8.6   . Smokeless tobacco: Never   Substance and Sexual Activity   . Alcohol use: Not Currently     Alcohol/week: 0.0 standard drinks     Comment: rarely   . Drug use: Yes     Comment: occ marijuana   . Sexual activity: Not Currently     No outpatient medications have been marked as taking for the 02/03/22 encounter [facility] Encounter).     Allergies   Allergen Reactions   . Aldactone [Spironolactone] hives   . Latex rash/itching   . Latex, Natural Rubber hives and rash/itching       Vital Signs:  Patient Vitals for the past 72 hrs:   Temp Heart Rate Resp BP BP Mean  SpO2 Weight   02/03/22 1605 100.3 F (37.9 C) 87 14 152/80 104 MM HG 99 % 98 kg (216 lb)   02/03/22 1533 -- 90 -- -- -- 99 % --           Diagnostics:  Labs:  No results found for this visit on 02/03/22.    Medications given in the ED  Medications - No data to display

## 2022-02-25 NOTE — ED Provider Notes (Signed)
 -------------------------------------------------------------------------------  Attestation signed by [name] Liv, MD at 02/25/22 1919  I ([name]) independently saw and evaluated the patient.  I agree with the resident's note below.  Please see my separate note for any other additional information.  -------------------------------------------------------------------------------          [facility] GENERAL EMERGENCY DEPARTMENT    Time of Arrival:   02/25/22 1149        Final diagnoses:   [O03.9] Complete miscarriage (Primary)   [R11.2] Nausea and vomiting, unspecified vomiting type       Medical Decision Making:      Differential Diagnosis:   Complete miscarriage, threatened miscarriage, missed miscarriage, endometritis, abnormal uterine bleeding, symptomatic anemia, others    Social Determinants of Health:  social factors reviewed, did not limit treatment                                Glasgow Coma Scale Score: 15            Supplemental Historians include:  patient and medical records     ED Course:   Ill-appearing 36 year old female with history of dilated cardiomyopathy status post ICD presenting to the ED for concerns of miscarriage.  Patient with 2-day history of vaginal bleeding that is progressively worsened with associated abdominal cramping and nausea vomiting.  Patient now with chest tightness.  Afebrile, reassuring vitals.    Exam notable for clinically dry appearing female with suprapubic tenderness to palpation, no peritoneal signs.  Patient appears well-perfused.  Pelvic exam notable for blood in vaginal vault, difficulty visualizing cervical os, but with obvious clots coming from the cervix that were removed.  No other apparent vaginal discharge, no cervical motion tenderness.      Labs with leukocytosis 15.4 reactionary versus infectious, favor reactionary given patient's active vomiting.  Hemoglobin 10.3 down trended from previous of ~12.  Unremarkable abdominal screening labs.   Beta-hCG notably decreased from prior 1377, concerning for miscarriage.    Screening cardiac labs unremarkable with troponin less than 6, proBNP 81 and nonischemic EKG.  CXR unremarkable.  Doubt ACS, pneumonia, CHF exacerbation.  Low clinical suspicion for PE - do not believe d-dimer or CTA indicated at this time.    Transvaginal ultrasound with no IUP identified, likely representing a complete miscarriage given recent history of visualized IUP.  Does note complex fluid within the endometrial canal.  Discussed suspicion of completed miscarriage with patient.    Initially symptomatically treated with Tylenol, IV fluids and Zofran x 2.  Patient still with persistent vomiting and required additional antiemetics with initially given and then followed by droperidol with additional IV fluid bolus.    The patient was turned over to Dr. Lowell Rude. I have made her aware of the H&P as well as all resulted tests, pending tests, interventions and the current assessment and plan.  Turned over pending reassessment for symptomatic improvement.  If still unable to tolerate PO intake, may require admission for intractable N/V.  Consider CT scan to rule out other acute intra-abdominal pathology and/or gynecology consult.                   Documentation/Prior Results Review:  Initial ED Provider Note , Old medical records, Nursing notes, Previous radiology studies    Lab results reviewed from 2021 patient is O+    Rhythm interpretation from monitor: N/A    Imaging Interpreted by me: X-Ray CXR without evidence  of focal infiltrate concerning for pneumonia, pulmonary edema or pneumothorax by my interpretation    US  PELVIC AND TRANSVAGINAL   Final Result    No intrauterine pregnancy is identified. Findings may represent a complete miscarriage given recent history of pregnancy. Short-term follow-up with serial hCG and pelvic ultrasound may be of value as well as follow-up with OB/GYN to document normalized hCG levels.               Signed  By: Suraj Jaisinghani, MD on 02/25/2022 3:54 PM         CHEST PORTABLE   Final Result      1.  No acute cardiopulmonary disease.       Signed By: Asher Lawn, MD on 02/25/2022 3:38 PM         EKG 12 LEAD UNIT PERFORMED   Final Result          .     Discussion of Mangement with other Physicians, QHP or Appropriate Source:   None    .    Disposition:   Turnover      New Prescriptions    No medications on file     Chief Complaint   Patient presents with   . PREGNANCY PROBLEM       HPI   36 year old female with history of dilated cardiomyopathy with ICD G6P0 at 9 weeks 2 days gestation presents the ED for 2-day history of abdominal cramping, vaginal bleeding and nausea vomiting.  Patient states she is concerned that she is miscarrying as this is how her previous miscarriages have presented.  Patient has presented to the ED month for similar symptoms was noted on 7/18 on follow-up visit to have a barely improved beta-hCG from prior (2002 > 2316) ultrasound with IUP dated at 5 weeks 6 days, too small for competent detection of fetal cardiac activity.  Patient had followed up with [facility] since that time with reported viable pregnancy.  Endorses she began to spot on 8/7 with increased bleeding overnight on 8/8 and has been saturating 1 pad per hour and has been passing clots.  Endorses 7 out of 10 abdominal cramping that is in the suprapubic region.  Endorses nausea and lightheadedness and some chest tightness.      Review of Systems:  Constitutional:  Negative for fever.   Cardiovascular:  Positive for chest pain.   Gastrointestinal:  Positive for nausea, vomiting and abdominal pain.   Genitourinary:  Positive for vaginal bleeding. Negative for dysuria and vaginal discharge.   Neurological:  Positive for light-headedness.     Physical Exam  Vitals and nursing note reviewed.   Constitutional:       Appearance: She is ill-appearing. She is not toxic-appearing or diaphoretic.   HENT:      Head: Normocephalic and  atraumatic.      Mouth/Throat:      Mouth: Mucous membranes are dry.   Eyes:      Conjunctiva/sclera: Conjunctivae normal.   Cardiovascular:      Rate and Rhythm: Normal rate and regular rhythm.      Pulses: Normal pulses.      Heart sounds: Normal heart sounds.   Pulmonary:      Effort: Pulmonary effort is normal.      Breath sounds: Normal breath sounds.   Abdominal:      General: There is no distension.      Palpations: Abdomen is soft.      Tenderness: There is abdominal tenderness (Suprapubic).  There is no guarding or rebound.   Musculoskeletal:      Comments: Moving all extremities symmetrically   Skin:     General: Skin is warm and dry.      Capillary Refill: Capillary refill takes less than 2 seconds.   Neurological:      General: No focal deficit present.      Mental Status: She is alert and oriented to person, place, and time.   Psychiatric:         Mood and Affect: Mood normal.         Behavior: Behavior normal.       Past Medical History:   Diagnosis Date   . Adverse effect of anesthesia 2009    per note in Care Everywhere, pt reports awareness during AICD implantation   . Cardiomyopathy (HCC)    . Congestive heart failure, unspecified    . Diabetes mellitus (HCC)     type II- no longer taking meds   . Exercise tolerance finding 05/24/2020    denies cp/ sob with climbing stairs   . Gout, unspecified    . Hypertension    . Hypertriglyceridemia    . ICD (implantable cardiac defibrillator) in place 08/03/2007    [facility] [employer] 2207 dual, generator change 2018--[facility]--[employer] [address]   . IUFD at less than 20 weeks of gestation 04/2020   . Morbidly obese (HCC)    . Nonischemic cardiomyopathy (HCC)    . PCOS (polycystic ovarian syndrome)     pt denies   . Syncope      Past Surgical History:   Procedure Laterality Date   . AICD IMPLANTATION  07/21/2007   . CARDIAC ABLATION  04/06/2017   . DILATION & EVACUATION N/A 05/28/2020    Procedure: DILATION AND EVACUATION, UTERUS;  Surgeon: Gail Joseph, MD   . HEART  CATHETERIZATION  2005   . IMPLANTED DEFIBRILLATOR  09/03/2016    GENERATOR CHANGE--2357-40C [employer] [address] DR; serial # [id number]     Family History   Problem Relation Age of Onset   . Milk Intolerance Mother    . Vision Problems Mother    . Migraines Mother    . Heart Disease Other    . Hypertension Other    . Diabetes Other    . Stroke Other      Social History     Occupational History   . Not on file   Tobacco Use   . Smoking status: Former     Packs/day: 0.50     Years: 4.00     Pack years: 2.00     Types: Cigarettes     Quit date: 06/17/2013     Years since quitting: 8.6   . Smokeless tobacco: Never   Substance and Sexual Activity   . Alcohol use: Not Currently     Alcohol/week: 0.0 standard drinks     Comment: rarely   . Drug use: Yes     Comment: occ marijuana   . Sexual activity: Not Currently     No outpatient medications have been marked as taking for the 02/25/22 encounter [facility] Encounter).     Allergies   Allergen Reactions   . Aldactone [Spironolactone] hives   . Latex rash/itching   . Latex, Natural Rubber hives and rash/itching       Vital Signs:  Patient Vitals for the past 72 hrs:   Temp Heart Rate Resp BP BP Mean SpO2   02/25/22 1619 -- 92 19  160/90 (!) 113 MM HG 99 %   02/25/22 1159 97.3 F (36.3 C) 90 18 163/97 (!) 119 MM HG 100 %       Diagnostics:  Labs:    Results for orders placed or performed during the hospital encounter of 02/25/22   GLUCOSE SCREEN   Result Value Ref Range    Glucose POC 161 (H) 70 - 99 mg/dL   BASIC METABOLIC PANEL   Result Value Ref Range    Potassium 3.7 3.5 - 5.5 mmol/L    Sodium 139 133 - 145 mmol/L    Chloride 104 98 - 110 mmol/L    Glucose 167 (H) 70 - 99 mg/dL    Calcium 9.4 8.4 - 34.7 mg/dL    BUN 4 (L) 6 - 22 mg/dL    Creatinine 0.6 0.5 - 1.2 mg/dL    CO2 21 20 - 32 mmol/L    eGFR >60.0 >60.0 mL/min/1.73 sq.m.    Anion Gap 14.0 3.0 - 15.0 mmol/L   HEPATIC FUNCTION PANEL   Result Value Ref Range    Albumin 4.5 3.5 - 5.0 g/dL    Total Protein 7.9 6.4 - 8.3 g/dL     Globulin 3.4 2.0 - 4.0 g/dL    A/G Ratio 1.3 1.1 - 2.6 ratio    Bilirubin Total 0.4 0.2 - 1.2 mg/dL    Bilirubin Direct <4.2 0.0 - 0.3 mg/dL    SGOT (AST) 15 10 - 37 U/L    Alkaline Phosphatase 69 25 - 115 U/L    SGPT (ALT) 14 5 - 40 U/L   LIPASE   Result Value Ref Range    Lipase <20 7 - 60 U/L   BETA HCG BLOOD (QUANTITATIVE)   Result Value Ref Range    Beta HCG 1,377 mIU/mL   Troponin Care Path   Result Value Ref Range    Troponin (T) Quant High Sensitivity (5th Gen) <6 0 - 19 ng/L   NT PROBNP   Result Value Ref Range    NT proBNP 81 <=125 pg/mL   CBC WITH DIFFERENTIAL AUTO   Result Value Ref Range    WBC 15.4 (H) 4.0 - 11.0 K/uL    RBC 3.42 (L) 3.80 - 5.20 M/uL    HGB 10.3 (L) 11.7 - 15.5 g/dL    HCT 59.5 (L) 63.8 - 46.5 %    MCV 90 80 - 99 fL    MCH 30 26 - 34 pg    MCHC 33 31 - 36 g/dL    RDW 75.6 43.3 - 29.5 %    Platelet 246 140 - 440 K/uL    MPV 9.4 9.0 - 13.0 fL    Segmented Neutrophils (Auto) 87 (H) 40 - 75 %    Lymphocytes (Auto) 7 (L) 20 - 45 %    Monocytes (Auto) 5 3 - 12 %    Eosinophils (Auto) 1 0 - 6 %    Basophils (Auto) 0 0 - 2 %    Absolute Neutrophils (Auto) 13.4 (H) 1.8 - 7.7 K/uL    Absolute Lymphocytes (Auto) 1.0 1.0 - 4.8 K/uL    Absolute Monocytes (Auto) 0.8 0.1 - 1.0 K/uL    Absolute Eosinophils (Auto) 0.1 0.0 - 0.5 K/uL    Absolute Basophils (Auto) 0.1 0.0 - 0.2 K/uL     ECG:  Results for orders placed or performed during the hospital encounter of 02/25/22   EKG 12 LEAD UNIT PERFORMED   Result Value Ref Range Status  Heart Rate 70 bpm Final    RR Interval 896 ms Final    Atrial Rate 67 ms Final    P-R Interval 188 ms Final    P Duration 134 ms Final    P Horizontal Axis -46 deg Final    P Front Axis 67 deg Final    Q Onset 500 ms Final    QRSD Interval 103 ms Final    QT Interval 433 ms Final    QTcB 457 ms Final    QTcF 456 ms Final    QRS Horizontal Axis  deg Final    QRS Axis -59 deg Final    I-40 Front Axis 25 deg Final    t-40 Horizontal Axis 192 deg Final    T-40 Front Axis -75 deg  Final    T Horizontal Axis -19 deg Final    T Wave Axis 61 deg Final    S-T Horizontal Axis 28 deg Final    S-T Front Axis 80 deg Final    Impression - ABNORMAL ECG -  Final    Impression SR-Sinus rhythm-normal P axis, V-rate 50-99  Final    Impression   Final     MVPC-Multiple ventricular premature complexes-V complexes w/ short R-R   intervls      Impression   Final     PLAE-Probable left atrial enlargement-P >60mS, <-0.62mV V1    Impression   Final     IRAFB-Incomplete RBBB and LAFB-axis(240,-40), S>R II III aVF    Impression   Final     CRVH-Consider right ventricular hypertrophy-large R or R' V1/V2    Impression   Final     LVH1-Left ventricular hypertrophy-multiple LVH criteria    Impression -When compared to prior, PVCs are new-  Final       Medications ordered/given in the ED  Medications   acetaminophen (TylenoL) tablet 1,000 mg (0 mg Oral Held 02/25/22 1406)   metoCLOPramide (Reglan) injection 10 mg (has no administration in time range)   diphenhydrAMINE (BenadryL) injection 25 mg (has no administration in time range)   Lactated Ringers (LR) infusion (has no administration in time range)   Lactated Ringers (LR) infusion (0 mL Intravenous stopped 02/25/22 1601)   ondansetron (PF) (Zofran) injection 4 mg (4 mg Intravenous Given 02/25/22 1310)   ondansetron (PF) (Zofran) injection 4 mg (4 mg IV Push Given 02/25/22 1431)   Lactated Ringers (LR) infusion (1,000 mL Intravenous New Bag 02/25/22 1606)   promethazine (Phenergan) injection 12.5 mg (12.5 mg IV Push Given 02/25/22 1522)   droPERidol (Inapsine) injection 1.25 mg (1.25 mg Intravenous Given 02/25/22 1620)       Marijo Shove, MD  [facility] Emergency Medicine PGY-3    Dictation disclaimer: Please note that this dictation was completed with Dragon, the computer voice recognition software. Quite often, unanticipated grammatical, syntax, homophones, and other interpretive errors are inadvertently transcribed by the computer software. Please disregard these errors. Please  excuse any errors that have escaped final proofreading.

## 2022-02-25 NOTE — ED Provider Notes (Signed)
 ED Resident Supervision Note    I have seen and evaluated [name] and agree with the provider's findings (exceptions, if any, noted below).  I reviewed and directed the Emergency Department treatment given to [name]     A/P: The patient is a 36 year old female with history of dilated cardiomyopathy, G6 P0-0-5-0, approximately 9 weeks and 2 days gestation presenting with abdominal cramping, vaginal bleeding, nausea, and vomiting.  Please see resident note for further information on HPI.  In short, the patient reports a 2-day history of symptoms which is similar to prior presentation of miscarriages.  Also reports mild chest tightness.  Patient was last evaluated in the emergency department last month for similar symptoms at which time she was found to be 5 weeks and 6 days gestation.    Initial vitals reviewed.  Blood pressure 163/97.  Vitals otherwise unremarkable.  Patient is uncomfortable and ill-appearing on exam.  She is answering questions appropriately and following commands.  She is moving all 4 extremities spontaneously.  No active emesis noted.    Plan for workup including lab work, urine studies, EKG, and chest x-ray and ultrasound imaging to further evaluate.  Will treat symptomatically with LR 1 L IV fluid bolus and Zofran 4 mg IV.  Disposition per workup but anticipate discussion with OB/GYN once workup results.    Critical Care Time:  none    Procedures:  none    S:  36 y.o. female  with a chief complaint of PREGNANCY PROBLEM      O:  Patient Vitals for the past 72 hrs:   Temp Heart Rate Resp BP BP Mean SpO2   02/25/22 1159 97.3 F (36.3 C) 90 18 163/97 (!) 119 MM HG 100 %          Note: This was dictated using a computer transcription program. Although proofread, it may contain computer transcription errors and phonetic errors. Other human proofreading errors may also exist. Corrections may be performed at a later time. Please contact for any clarification if needed.

## 2022-02-26 LAB — HEMOGLOBIN A1C
Estimated Avg Glucose, External: 163 mg/dL — ABNORMAL HIGH (ref 91–123)
Hemoglobin A1C, External: 7.3 % — ABNORMAL HIGH (ref 4.8–5.6)

## 2022-03-01 NOTE — Progress Notes (Signed)
 Hospitalization Progression Plan (BED)  Barriers to Discharge: nausea controlled  Expected Day of Discharge: 02/27/22  Discharge Disposition: home      Assessment & Plan   36 year old female with a past medical history of dilated cardiomyopathy, chronic systolic heart failure, hypertension, tobacco abuse, marijuana use who presents with intractable nausea and vomiting and vaginal bleeding.     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  Telemetry is ordered.  -Consider GI consult if no resolution of her nausea     Vaginal Bleeding  1st Trimester SAB, Complete  -Initial US  (d=[redacted]w[redacted]d) with IUP but small for fetal cardiac activity detection  -Pelvic US  on 8/9 wnl with no IUP  -B-hCG down-trending: 2002 (7/18) > 1377 (8/9)    -vaginal bleeding improving   -per OBGYN team: recommend patient follow-up with [facility]  -would also recommend that patient be started on birth control, she reports this is her 67th miscarriage   -she would like obgyn outpatient to start birth control   -She passed a blood clot, OB/GYN was notified and they followed and according to their documentation, Repeat speculum exam: 2 fox swabs of old dark blood. No active bleed, visually close cervix, no lesions.  -OB/GYN signed off  -H&H is stable     Dilated Cardiomyopathy  Chronic Systolic Heart Failure  -cardiologist: Dr. Clora Dane  -currently not on any medications  -echo showed 60% ejection fraction, bnp mildly elevated   -low threshold to start lasix      Type 2 diabetes mellitus   -not on home meds  -SSI started  -hgb a1c 7.3%  -will need to start meds outpatient      Morbid obesity  -Continue diet and  exercise efforts on discharge    UA on the ninth weakly positive for UTI  -Repeat UA and start empiric Rocephin.  WBCs are improving, patient is afebrile    Replete electrolytes per protocol          Subjective:       Patient and her mother were upset about holding Zofran and promethazine.  Explained to them that in the setting of hypokalemia chances of arrhythmia is increased.  Benadryl had worked with her before.  I also explained to the OB/GYN exam and findings.  They were satisfied after conversation with me regarding the care of this patient.  She has not had bowel movement in over 5 days.  Bowel regimen was added.  Discussed with patient's nurse    Visit Vitals  BP 149/78   Pulse 69   Temp 97.9 F (36.6 C)   Resp 16   Ht 69" (1.753 m)   Wt 98.9 kg (218 lb 1.6 oz)   SpO2 100%   BMI 32.21 kg/m       No acute distress, moist oral mucosa, clear to auscultation, regular rate rhythm, soft nontender positive bowel sounds       Today's labs and medication reviewed    Alric Asp, MD  03/01/2022, 9:01 AM

## 2022-03-01 NOTE — Consults (Signed)
PIV placed via [place] and curos cap applied.  PPE: surgical mask, goggles, and gloves worn.  Thanks

## 2022-03-02 NOTE — Progress Notes (Signed)
 Patient left floor and was gone for unknown time. Per PCS Maxine, Patient unhooked herself from IV and went outside and got locked out. PCS found patient and escorted her back to her room.

## 2022-03-02 NOTE — Discharge Summary (Signed)
 Discharge Summary   Admit Date: 02/25/2022  Discharge Date:  03/02/2022      Patient ID:  [name]  36 y.o.  [birth year]    Discharge Diagnosis:    Active Hospital Problems    Diagnosis Date Noted   . Complete miscarriage 02/25/2022   . Intractable nausea and vomiting 02/25/2022      Resolved Hospital Problems   No resolved problems to display.          Discharge Medication List        START taking these medications      amLODIPine 5 mg Tabs  Start date: March 03, 2022  Take 1 Tab by Mouth Once a Day.  Dispense: 30 Tab  Refills: 0  Commonly known as: Norvasc     cefdinir 300 mg Caps  End date: March 05, 2022  Take 1 Cap by Mouth Twice Daily for 3 days.  Dispense: 6 Cap  Refills: 0  Commonly known as: Omnicef     losartan 50 mg Tabs  Start date: March 03, 2022  Take 1 Tab by Mouth Once a Day.  Dispense: 30 Tab  Refills: 0  Commonly known as: Cozaar     polyethylene glycol 17 gram Pwpk  Start date: March 03, 2022  Take 1 Packet by Mouth Once a Day.  Dispense: 30 Packet  Refills: 0  Commonly known as: Miralax            CONTINUE taking these medications      acetaminophen 325 mg Tabs  Take 325 mg by Mouth Every 4 Hours As Needed.  Refills: 0  Commonly known as: TylenoL     diclofenac sodium 1 % Gel  Use 2 g to affected area 4 Times Daily.  Dispense: 50 g  Refills: 0  Commonly known as: Voltaren Arthritis Pain            STOP taking these medications      PRENATAL PO              HPI: Per H&P  Patient is a 36 year old female with a past medical history of dilated cardiomyopathy status post ICD, type 2 diabetes mellitus, obesity who comes emergency department at the direction of her obstetrician due to vaginal bleeding.     Patient reports she began having vaginal bleeding this past Monday which has continued for the past several days.  In the emergency department patient was found to have decreased beta-hCG and transvaginal ultrasound without any IUP identified, representing miscarriage.  OB/GYN was  consulted.     Separately, the patient complains of nausea and vomiting that began this morning.  She denies any fevers or abdominal pain.  Denies diarrhea.  She does report current marijuana use. She was given several antiemetics in the emergency department but continued to have intractable nausea and vomiting.  Patient unable to tolerate p.o.  Hospital medicine was then called for admission.  Hospital Course:  36 year old female with a past medical history of dilated cardiomyopathy, chronic systolic heart failure, hypertension, tobacco abuse, marijuana use who presents with intractable nausea and vomiting and vaginal bleeding.     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  -Continue antiemetic therapy, did not tolerate capsaicin cream as well  -will increase diet to clears and then more as tolerates   -was on benadryl, zofran, compazine prn  -due to her hypokalemia and increased risk of arrhythmia with Zofran and promethazine were discontinued  Her symptoms resolved  with benadryl        Vaginal Bleeding  1st Trimester SAB, Complete  -Initial US  (d=[redacted]w[redacted]d) with IUP but small for fetal cardiac activity detection  -Pelvic US  on 8/9 wnl with no IUP  -B-hCG down-trending: 2002 (7/18) > 1377 (8/9)    -vaginal bleeding improving   -per OBGYN team: recommend patient follow-up with [facility]  -would also recommend that patient be started on birth control, she reports this is her 25th miscarriage   -she would like obgyn outpatient to start birth control   -She passed a blood clot, OB/GYN was notified and they followed and according to their documentation, Repeat speculum exam: 2 fox swabs of old dark blood. No active bleed, visually close cervix, no lesions.  -OB/GYN signed off  -H&H is stable     Dilated Cardiomyopathy  Chronic Systolic Heart Failure  -cardiologist: Dr. Clora Dane  -currently not on any medications  -echo showed 60% ejection fraction, bnp mildly elevated   -low threshold to start  lasix      Type 2 diabetes mellitus   -not on home meds  -SSI started  -hgb a1c 7.3%  -will need to start meds outpatient , fu with PCP for that      Morbid obesity  -Continue diet and exercise efforts on discharge     UA on the ninth weakly positive for UTI  -rocephin then omnicef      Replete electrolytes per protocol        Vitals on Discharge:  BP 149/85   Pulse 73   Temp 99 F (37.2 C)   Resp 18   Ht 69" (1.753 m)   Wt 101.9 kg (224 lb 11.2 oz)   SpO2 100%   BMI 33.18 kg/m       Most Recent BMP and CBC:    Recent Labs     03/02/22  0500   GLUCOSE 194*   BUN 6   CALCIUM 8.2*   CREAT 0.6   NA 139   POTASSIUM 3.3*   CHLORIDE 105   CO2 24     Recent Labs     03/02/22  0500   WBC 11.3*   RBC 3.68*   HEMOGLOBIN 10.8*   HCT 31.9*   MCV 87   MCH 29   MCHC 34   PLATELET 251   RDW 12.8           Disposition:  Home    Discharge Procedure Orders   Follow Up with PCP within 7 days    Follow up with [facility] (Inactive) within 7 days.     Additional Instructions as Follows:    Follow up with your ob/gyn within one week       PCP:  [facility] (Inactive)    Time spent on Discharge Activities: 25 min

## 2023-06-04 NOTE — ED Provider Notes (Signed)
 Chief Complaint (Patient Stated)      Vomiting (Pt presents to facility c/o n/v/d since this morning. Brought in by ambulance as pt reports she was feeling too weak to drive. Hx of diabetes, poorly controlled. Pt alert and oriented in triage, actively vomiting. )      History of Present Illness      Patient is a 37 year old with history of type 2 diabetes who is complaining of nausea vomiting diarrhea since last evening.  Has mild crampy abdominal pain.  No fever.  She normally takes metformin for her diabetes.         Chart Review:     Patient History & Home Meds & Allergies      No past medical history on file.    No past surgical history on file.    No current outpatient medications    Allergies   Allergen Reactions   . Latex Other (See Comments)        No family history on file.    Social History     Tobacco Use   . Smoking status: Not on file   . Smokeless tobacco: Not on file   Substance Use Topics   . Alcohol use: Not on file   . Drug use: Not on file        Review of Systems      Review of Systems: Pertinent review of systems notated above in HPI.    Physical Exam     ED Triage Vitals   Pulse 06/04/23 0923 80   Resp 06/04/23 0923 18   SpO2 06/04/23 0923 96 %   BP 06/04/23 0923 126/80   BP Location 06/04/23 0923 Right arm   Patient Position 06/04/23 0923 Sitting   Temp 06/04/23 0923 96.3 F (35.7 C)   Temp Source 06/04/23 0923 Temporal   Height 06/04/23 0924 5\' 5"  (1.651 m)   Height Method 06/04/23 0924 Stated   Weight 06/04/23 0924 220 lb (99.8 kg)   Weight Method 06/04/23 0924 Stated        Physical Exam  Vitals reviewed.   Constitutional:       General: She is not in acute distress.  HENT:      Head: Normocephalic and atraumatic.   Cardiovascular:      Rate and Rhythm: Normal rate.      Heart sounds: Normal heart sounds.   Pulmonary:      Breath sounds: Normal breath sounds.   Abdominal:      Palpations: Abdomen is soft.      Tenderness: There is no abdominal tenderness.      Comments: obese    Musculoskeletal:      Cervical back: Neck supple.   Skin:     General: Skin is warm and dry.      Capillary Refill: Capillary refill takes less than 2 seconds.   Neurological:      General: No focal deficit present.      Mental Status: She is alert.         Labs and Imaging       Lab Results:  Results for orders placed or performed during the hospital encounter of 06/04/23   CBC with differential   Result Value Ref Range    WBC 19.6 (H) 4.0 - 11.0 x10E3/uL    RBC 4.89 4.01 - 5.47 x10E6/uL    Hemoglobin 14.2 12.0 - 16.0 gm/dL    Hct 42 36 - 48 %    MCV 87  81 - 101 fL    MCHC 33.5 31.1 - 35.5 gm/dL    RDW 02.7 25.3 - 66.4 %    Platelets 329 150 - 400 x10E3/uL    Diff Type Auto Diff     Neutrophils 91 %    Lymphocytes 4 %    Monocytes 5 %    Eosinophils 1 %    Basophils 0 %    Abs. Neutrophil 17.7 (H) 1.8 - 7.0 x10E3/uL    Abs. Lymphocyte 0.8 (L) 1.0 - 3.4 x10E3/uL    Abs. Monocyte 0.9 (H) 0.2 - 0.8 x10E3/uL    Abs. Eosinophil 0.1 0.0 - 0.3 x10E3/uL    Abs. Basophil 0.1 0.0 - 0.1 x10E3/uL   Comprehensive metabolic panel   Result Value Ref Range    Sodium 137 135 - 145 mmol/L    Potassium 4.0 3.5 - 5.2 mmol/L    Chloride 103 98 - 107 mmol/L    CO2 19 19 - 29 mmol/L    Anion Gap 16 <18 mmol/L    Glucose 216 (H) 60 - 100 mg/dL    BUN 8 7 - 25 mg/dL    Creatinine 4.03 4.74 - 1.04 mg/dL    Calculated eGFR 259 >60 mL/min/1.40m2    Calcium 9.7 8.5 - 10.2 mg/dL    Total Protein 7.8 6.1 - 8.2 gm/dL    Albumin 4.2 3.3 - 5.2 gm/dL    Globulin 3.6 2.4 - 4.2 gm/dL    Bilirubin, Total 0.6 0.0 - 1.2 mg/dL    Alk Phos 54 40 - 563 U/L    AST(SGOT) 42 (H) 10 - 40 U/L    ALT(SGPT) 46 (H) 10 - 40 U/L   Lipase   Result Value Ref Range    Lipase 19 13 - 60 U/L   Pregnancy, serum   Result Value Ref Range    Pregnancy Serum Qual Negative    UTI Suspected: UA with culture if indicated    Specimen: Urine   Result Value Ref Range    Source Urine     Color YELLOW (YEL    Appearance Clear (CLEAR    Specific Gravity 1.010 1.003 - 1.030    pH 6.5  5.0 - 8.0    Glucose, urine 500 (A) (NEG mg/dL    Bilirubin Negative (NEG    Ketones,Urine >80 (A) (NEG mg/dL    Blood Negative (NEG    Protein Negative (NEG mg/dL    Urobilinogen Normal (NORM mg/dL    Nitrite Negative (NEG    Leukocyte Esterase Negative (NEG    Ur Culture Rflx Order Not Indicated    Lactic acid   Result Value Ref Range    Lactic Acid 2.9 (H) 0.5 - 2.2 mmol/L   High Sensitivity Troponin T   Result Value Ref Range    High Sensitivity Troponin T <6 0 - 14 ng/L   High Sensitivity Troponin T   Result Value Ref Range    High Sensitivity Troponin T <6 0 - 14 ng/L   Pro-BNP N-terminal   Result Value Ref Range    NT Pro BNP 80 0 - 125 pg/mL   Lactic acid   Result Value Ref Range    Lactic Acid 3.0 (H) 0.5 - 2.2 mmol/L       Imaging Results:  XR CHEST PORTABLE   Final Result   IMPRESSION:   Mild cardiac silhouette enlargement without overt edema or focal   consolidation.      CT Abdomen &  Pelvis W IV Contrast Only   Final Result   IMPRESSION:      1. No acute abdomen, without bowel obstruction, diverticulitis,   abscess, free air, colitis, ascites or appendicitis. No   hydronephrosis or pyelonephritis      2. Fatty liver         Radiation dose : 580 mGy.cm      Technique: One or more of the following dose reduction techniques   were utilized: automated exposure control, mA and/or kV adjustment   according to the patient's size, and or iterative reconstruction   technique.             All imaging and lab results have been personally reviewed and interpreted by myself, annotations are noted in the ED course for personal review and interpretations.    Assessment & Plan     Clinical Impression:     Final diagnoses:   Viral enteritis, not otherwise specified (Primary)        Disposition       ED Disposition   Discharge    Comment   Auther Bo [place]ite discharge to home/self care.                 Medical Decision Making          ED Course annotated above should be reviewed for pertinent additional medical  decision making comments pertaining to this patients workup and disposition. Prior chart review and external notes, labs and imaging reviewed from unique sources contributing to today's visit has been documented above under HPI.    Medical Decision Making  Patient is a 37 year old with history of type 2 diabetes who is complaining of nausea vomiting diarrhea since last evening.  Has mild crampy abdominal pain.  No fever.  She normally takes metformin for her diabetes.  Patient's WBC is elevated at 19.6 and lactic acid is 2.9.   11:44 AM  Patient has began to complain of chest pain and shortness of breath following her CT.  She has a history of pacemaker defibrillator and congestive heart failure.  EKG read with Dr. Darra Elliot has diffuse minimal ST elevation and nonspecific T wave abnormality   Abdominal CT: Within normal limits except for fatty liver.  1:26 PM  Patient states she is feeling better and her repeat lactic was essentially the same.  She only received 1 L of IV fluids due to her history of congestive heart failure and complaints of shortness of breath.  Her repeat troponin was within normal limits.  Chest x-ray within normal limits.  She is sipping on ginger ale at this time.  Discussed with her I feel she most likely has a viral enteritis.  Do not suspect sepsis as her vitals are normal.  Will send home with antiemetics and recommend she get her stool cultured if her symptoms last more than 2 more days.    Amount and/or Complexity of Data Reviewed  Labs: ordered.  Radiology: ordered.  ECG/medicine tests: ordered.    Risk  Prescription drug management.         ED Scoring Tools    No documentation.                   ECG Interpretation    Date/Time: 06/04/2023 11:49 AM    Performed by: Mahalia Schultze, PA-C  Authorized by: Mahalia Schultze, PA-C    ECG interpreted by ED Physician in the absence of a cardiologist: yes    Previous ECG:  Previous ECG:  Unavailable  Interpretation:     Interpretation: abnormal     QRS:     QRS axis:  Normal    QRS intervals:  Normal  ST segments:     ST segments:  Non-specific    Details:  Less than 1mm elev diffuse  T waves:     T waves: non-specific    Q waves:     Abnormal Q-waves: not present    Comments:      Interpreted with Dr Zelpha Hides, Betty Bruckner, PA-C  06/04/23 1417

## 2023-11-12 ENCOUNTER — Ambulatory Visit: Attending: Internal Medicine | Primary: Diagnostic Radiology
